# Patient Record
Sex: Female | Born: 1970 | Race: Black or African American | Hispanic: No | Marital: Single | Smoking: Never smoker
Health system: Southern US, Community
[De-identification: ages and names within clinical notes are randomized; demographics above are authoritative.]

## PROBLEM LIST (undated history)

## (undated) DIAGNOSIS — I509 Heart failure, unspecified: Secondary | ICD-10-CM

## (undated) DIAGNOSIS — I428 Other cardiomyopathies: Secondary | ICD-10-CM

## (undated) DIAGNOSIS — I10 Essential (primary) hypertension: Secondary | ICD-10-CM

## (undated) DIAGNOSIS — J45909 Unspecified asthma, uncomplicated: Secondary | ICD-10-CM

## (undated) DIAGNOSIS — K219 Gastro-esophageal reflux disease without esophagitis: Secondary | ICD-10-CM

## (undated) HISTORY — DX: Heart failure, unspecified: I50.9

## (undated) HISTORY — DX: Other cardiomyopathies: I42.8

## (undated) HISTORY — DX: Essential (primary) hypertension: I10

---

## 2001-06-25 ENCOUNTER — Emergency Department (HOSPITAL_COMMUNITY): Admission: EM | Admit: 2001-06-25 | Discharge: 2001-06-25 | Payer: Self-pay | Admitting: Emergency Medicine

## 2001-06-25 ENCOUNTER — Encounter: Payer: Self-pay | Admitting: Emergency Medicine

## 2001-06-29 ENCOUNTER — Emergency Department (HOSPITAL_COMMUNITY): Admission: EM | Admit: 2001-06-29 | Discharge: 2001-06-29 | Payer: Self-pay | Admitting: Emergency Medicine

## 2001-06-29 ENCOUNTER — Encounter: Payer: Self-pay | Admitting: Emergency Medicine

## 2002-08-16 ENCOUNTER — Emergency Department (HOSPITAL_COMMUNITY): Admission: EM | Admit: 2002-08-16 | Discharge: 2002-08-16 | Payer: Self-pay | Admitting: *Deleted

## 2009-04-16 ENCOUNTER — Other Ambulatory Visit: Admission: RE | Admit: 2009-04-16 | Discharge: 2009-04-16 | Payer: Self-pay | Admitting: Unknown Physician Specialty

## 2009-12-29 ENCOUNTER — Emergency Department (HOSPITAL_COMMUNITY): Admission: EM | Admit: 2009-12-29 | Discharge: 2009-12-29 | Payer: Self-pay | Admitting: Emergency Medicine

## 2010-04-08 ENCOUNTER — Inpatient Hospital Stay (HOSPITAL_COMMUNITY): Admission: RE | Admit: 2010-04-08 | Payer: Self-pay | Source: Ambulatory Visit

## 2010-04-08 ENCOUNTER — Other Ambulatory Visit (HOSPITAL_COMMUNITY)
Admission: RE | Admit: 2010-04-08 | Discharge: 2010-04-08 | Disposition: A | Discharge status: Home / Self Care | Payer: Self-pay | Source: Ambulatory Visit | Attending: Unknown Physician Specialty | Admitting: Unknown Physician Specialty

## 2010-04-08 ENCOUNTER — Other Ambulatory Visit: Payer: Self-pay | Admitting: Nurse Practitioner

## 2010-04-08 DIAGNOSIS — N879 Dysplasia of cervix uteri, unspecified: Secondary | ICD-10-CM | POA: Insufficient documentation

## 2011-09-10 ENCOUNTER — Other Ambulatory Visit (HOSPITAL_COMMUNITY): Payer: Self-pay | Admitting: Nurse Practitioner

## 2011-09-10 DIAGNOSIS — Z139 Encounter for screening, unspecified: Secondary | ICD-10-CM

## 2011-09-15 ENCOUNTER — Ambulatory Visit (HOSPITAL_COMMUNITY): Payer: Self-pay

## 2011-10-05 ENCOUNTER — Ambulatory Visit (HOSPITAL_COMMUNITY): Payer: Self-pay

## 2012-10-03 ENCOUNTER — Ambulatory Visit (HOSPITAL_COMMUNITY): Payer: Self-pay

## 2012-10-13 ENCOUNTER — Ambulatory Visit (HOSPITAL_COMMUNITY): Payer: Self-pay

## 2014-07-16 ENCOUNTER — Emergency Department (HOSPITAL_COMMUNITY): Payer: 59

## 2014-07-16 ENCOUNTER — Encounter (HOSPITAL_COMMUNITY): Payer: Self-pay | Admitting: Emergency Medicine

## 2014-07-16 ENCOUNTER — Emergency Department (HOSPITAL_COMMUNITY)
Admission: EM | Admit: 2014-07-16 | Discharge: 2014-07-16 | Disposition: A | Discharge status: Home / Self Care | Payer: 59 | Attending: Emergency Medicine | Admitting: Emergency Medicine

## 2014-07-16 DIAGNOSIS — R1013 Epigastric pain: Secondary | ICD-10-CM | POA: Insufficient documentation

## 2014-07-16 DIAGNOSIS — R0602 Shortness of breath: Secondary | ICD-10-CM | POA: Diagnosis not present

## 2014-07-16 DIAGNOSIS — R1011 Right upper quadrant pain: Secondary | ICD-10-CM | POA: Diagnosis not present

## 2014-07-16 DIAGNOSIS — R109 Unspecified abdominal pain: Secondary | ICD-10-CM | POA: Diagnosis present

## 2014-07-16 DIAGNOSIS — R112 Nausea with vomiting, unspecified: Secondary | ICD-10-CM | POA: Diagnosis not present

## 2014-07-16 DIAGNOSIS — R05 Cough: Secondary | ICD-10-CM | POA: Insufficient documentation

## 2014-07-16 LAB — CBC WITH DIFFERENTIAL/PLATELET
BASOS PCT: 1 % (ref 0–1)
Basophils Absolute: 0.1 10*3/uL (ref 0.0–0.1)
EOS ABS: 0.1 10*3/uL (ref 0.0–0.7)
Eosinophils Relative: 2 % (ref 0–5)
HEMATOCRIT: 38.6 % (ref 36.0–46.0)
Hemoglobin: 12.3 g/dL (ref 12.0–15.0)
LYMPHS PCT: 29 % (ref 12–46)
Lymphs Abs: 2.1 10*3/uL (ref 0.7–4.0)
MCH: 26.6 pg (ref 26.0–34.0)
MCHC: 31.9 g/dL (ref 30.0–36.0)
MCV: 83.5 fL (ref 78.0–100.0)
MONO ABS: 0.5 10*3/uL (ref 0.1–1.0)
Monocytes Relative: 7 % (ref 3–12)
NEUTROS ABS: 4.4 10*3/uL (ref 1.7–7.7)
NEUTROS PCT: 61 % (ref 43–77)
Platelets: 254 10*3/uL (ref 150–400)
RBC: 4.62 MIL/uL (ref 3.87–5.11)
RDW: 13.7 % (ref 11.5–15.5)
WBC: 7.2 10*3/uL (ref 4.0–10.5)

## 2014-07-16 LAB — URINALYSIS, ROUTINE W REFLEX MICROSCOPIC
Bilirubin Urine: NEGATIVE
Glucose, UA: NEGATIVE mg/dL
Ketones, ur: NEGATIVE mg/dL
Leukocytes, UA: NEGATIVE
NITRITE: NEGATIVE
Protein, ur: 30 mg/dL — AB
Specific Gravity, Urine: 1.025 (ref 1.005–1.030)
UROBILINOGEN UA: 0.2 mg/dL (ref 0.0–1.0)
pH: 5.5 (ref 5.0–8.0)

## 2014-07-16 LAB — COMPREHENSIVE METABOLIC PANEL
ALBUMIN: 3.2 g/dL — AB (ref 3.5–5.0)
ALT: 47 U/L (ref 14–54)
AST: 27 U/L (ref 15–41)
Alkaline Phosphatase: 54 U/L (ref 38–126)
Anion gap: 7 (ref 5–15)
BUN: 16 mg/dL (ref 6–20)
CO2: 21 mmol/L — AB (ref 22–32)
Calcium: 8.1 mg/dL — ABNORMAL LOW (ref 8.9–10.3)
Chloride: 111 mmol/L (ref 101–111)
Creatinine, Ser: 1.24 mg/dL — ABNORMAL HIGH (ref 0.44–1.00)
GFR calc Af Amer: 59 mL/min — ABNORMAL LOW (ref >60)
GFR calc non Af Amer: 51 mL/min — ABNORMAL LOW (ref >60)
GLUCOSE: 114 mg/dL — AB (ref 65–99)
POTASSIUM: 3.8 mmol/L (ref 3.5–5.1)
Sodium: 139 mmol/L (ref 135–145)
TOTAL PROTEIN: 6 g/dL — AB (ref 6.5–8.1)
Total Bilirubin: 0.4 mg/dL (ref 0.3–1.2)

## 2014-07-16 LAB — TROPONIN I: Troponin I: <0.03 ng/mL (ref <0.031)

## 2014-07-16 LAB — URINE MICROSCOPIC-ADD ON

## 2014-07-16 LAB — LIPASE, BLOOD: Lipase: 21 U/L — ABNORMAL LOW (ref 22–51)

## 2014-07-16 MED ORDER — SODIUM CHLORIDE 0.9 % IV BOLUS (SEPSIS)
1000.0000 mL | Freq: Once | INTRAVENOUS | Status: AC
  Administered 2014-07-16: 1000 mL via INTRAVENOUS

## 2014-07-16 MED ORDER — SUCRALFATE 1 GM/10ML PO SUSP: 1.0000 g | Freq: Three times a day (TID) | ORAL | Status: DC

## 2014-07-16 MED ORDER — FAMOTIDINE IN NACL 20-0.9 MG/50ML-% IV SOLN
20.0000 mg | Freq: Once | INTRAVENOUS | Status: AC
  Administered 2014-07-16: 20 mg via INTRAVENOUS
  Filled 2014-07-16: qty 50

## 2014-07-16 MED ORDER — ONDANSETRON HCL 4 MG/2ML IJ SOLN
4.0000 mg | Freq: Once | INTRAMUSCULAR | Status: AC
  Administered 2014-07-16: 4 mg via INTRAVENOUS
  Filled 2014-07-16: qty 2

## 2014-07-16 MED ORDER — RANITIDINE HCL 150 MG PO TABS: 150.0000 mg | ORAL_TABLET | Freq: Two times a day (BID) | ORAL | Status: DC

## 2014-07-16 MED ORDER — TRAMADOL HCL 50 MG PO TABS: 50.0000 mg | ORAL_TABLET | Freq: Four times a day (QID) | ORAL | Status: DC | PRN

## 2014-07-16 NOTE — ED Notes (Signed)
Patient complaining of epigastric pain x 4 days. States "it feels like my food is coming back up and it feels like I can't hardly breathe when I lay down."

## 2014-07-16 NOTE — ED Notes (Signed)
,  Patient with no complaints at this time. Respirations even and unlabored. Skin warm/dry. Discharge instructions reviewed with patient at this time. Patient given opportunity to voice concerns/ask questions. Patient discharged at this time and left Emergency Department with steady gait.

## 2014-07-16 NOTE — Discharge Instructions (Signed)

## 2014-07-16 NOTE — ED Provider Notes (Signed)
CSN: [id number]     Arrival date & time 07/16/14  1510 History   First MD Initiated Contact with Patient 07/16/14 1527     Chief Complaint  Patient presents with  . Abdominal Pain     (Consider location/radiation/quality/duration/timing/severity/associated sxs/prior Treatment) HPI Comments: Patient presents to the ER for evaluation of upper abdominal pain. Symptoms began 4 days ago. She reports that symptoms have been present continuously since they started, but she does report eating and lying down flat makes the symptoms worse. She reports that she has noticed shortness of breath, especially when she lies down flat on her back. She also has had nausea and vomiting, in the last 24 hours, everything she eats comes back up. She has not had any urinary symptoms. She denies fever, diarrhea, constipation. She has recently had cough and congestion.  Patient is a 44 y.o. female presenting with abdominal pain.  Abdominal Pain Associated symptoms: cough, nausea, shortness of breath and vomiting     History reviewed. No pertinent past medical history. History reviewed. No pertinent past surgical history. History reviewed. No pertinent family history. History  Substance Use Topics  . Smoking status: Never Smoker   . Smokeless tobacco: Not on file  . Alcohol Use: No   OB History    No data available     Review of Systems  Respiratory: Positive for cough and shortness of breath.   Gastrointestinal: Positive for nausea, vomiting and abdominal pain.  All other systems reviewed and are negative.     Allergies  Review of patient's allergies indicates no known allergies.  Home Medications   Prior to Admission medications   Not on File   BP 171/126 mmHg  Pulse 117  Temp(Src) 98 F (36.7 C) (Oral)  Resp 18  Ht  (1.676 m)  Wt 169 lb (76.658 kg)  BMI 27.29 kg/m2  SpO2 100%  LMP 07/13/2014 Physical Exam  Constitutional: She is oriented to person, place, and time. She appears  well-developed and well-nourished. No distress.  HENT:  Head: Normocephalic and atraumatic.  Right Ear: Hearing normal.  Left Ear: Hearing normal.  Nose: Nose normal.  Mouth/Throat: Oropharynx is clear and moist and mucous membranes are normal.  Eyes: Conjunctivae and EOM are normal. Pupils are equal, round, and reactive to light.  Neck: Normal range of motion. Neck supple.  Cardiovascular: Regular rhythm, S1 normal and S2 normal.  Exam reveals no gallop and no friction rub.   No murmur heard. Pulmonary/Chest: Effort normal and breath sounds normal. No respiratory distress. She exhibits no tenderness.  Abdominal: Soft. Normal appearance and bowel sounds are normal. There is no hepatosplenomegaly. There is tenderness in the epigastric area. There is no rebound, no guarding, no tenderness at McBurney's point and negative Murphy's sign. No hernia.  Musculoskeletal: Normal range of motion.  Neurological: She is alert and oriented to person, place, and time. She has normal strength. No cranial nerve deficit or sensory deficit. Coordination normal. GCS eye subscore is 4. GCS verbal subscore is 5. GCS motor subscore is 6.  Skin: Skin is warm, dry and intact. No rash noted. No cyanosis.  Psychiatric: She has a normal mood and affect. Her speech is normal and behavior is normal. Thought content normal.  Nursing note and vitals reviewed.   ED Course  Procedures (including critical care time) Labs Review Labs Reviewed  COMPREHENSIVE METABOLIC PANEL - Abnormal; Notable for the following:    CO2 21 (*)    Glucose, Bld 114 (*)  Creatinine, Ser 1.24 (*)    Calcium 8.1 (*)    Total Protein 6.0 (*)    Albumin 3.2 (*)    GFR calc non Af Amer 51 (*)    GFR calc Af Amer 59 (*)    All other components within normal limits  LIPASE, BLOOD - Abnormal; Notable for the following:    Lipase 21 (*)    All other components within normal limits  CBC WITH DIFFERENTIAL/PLATELET  TROPONIN I  URINALYSIS,  ROUTINE W REFLEX MICROSCOPIC    Imaging Review Dg Chest 2 View  07/16/2014   CLINICAL DATA:  Chest pain and difficulty breathing  EXAM: CHEST  2 VIEW  COMPARISON:  None.  FINDINGS: Cardiac shadow is enlarged. The lungs are clear bilaterally. No bony abnormality is noted.  IMPRESSION: Cardiomegaly without acute pulmonary abnormality.   Electronically Signed   By: Alcide Clever M.D.   On: 07/16/2014 16:28   US Abdomen Limited Ruq  07/16/2014   CLINICAL DATA:  Right upper quadrant pain  EXAM: US ABDOMEN LIMITED - RIGHT UPPER QUADRANT  COMPARISON:  None.  FINDINGS: Gallbladder:  Well distended with diffuse gallbladder wall thickening and gallbladder wall edema. No calculi are seen.  Common bile duct:  Diameter: 2.6 mm.  Liver:  Within normal limits.  IMPRESSION: Diffusely thickened gallbladder wall without evidence of cholelithiasis. These changes would be consistent with acalculous cholecystitis. Correlation with the physical exam is recommended.   Electronically Signed   By: Alcide Clever M.D.   On: 07/16/2014 16:41     EKG Interpretation   Date/Time:  Monday Jul 16 2014 15:19:35 EDT Ventricular Rate:  116 PR Interval:  146 QRS Duration: 74 QT Interval:  312 QTC Calculation: 433 R Axis:   92 Text Interpretation:  Sinus tachycardia Rightward axis Cannot rule out  Anterior infarct , age undetermined T wave abnormality, consider inferior  ischemia Abnormal ECG No previous tracing Confirmed by POLLINA  MD,  CHRISTOPHER [phone]) on 07/16/2014 3:38:01 PM      MDM   Final diagnoses:  RUQ pain  Shortness of breath    Patient presents to the ER for evaluation of abdominal pain. Patient has been experiencing epigastric pain for 4 days. Symptoms worsened when she lies down or when she eats. She reports that she is feeling short of breath with this. Her lungs are clear on examination. Chest x-ray did not show any acute abnormality. No concern for congestive heart failure. Blood work was entirely  normal. This included troponin. EKG was unremarkable other than mild tachycardia, felt to be secondary to nausea, vomiting, dehydration and abdominal pain. LFTs are normal. Lipase is normal. Ultrasound of right upper quadrant reveals gallbladder wall thickening, but there are no stones noted. She does not have tenderness over the right upper quadrant specifically, but more diffuse tenderness. Maximal tenderness is in the epigastric region. She will be treated with antacids and analgesia, referral to GI. I suspect that her symptoms are more related to reflux then acalculous cholecystitis. After GI workup, can be referred to surgery if necessary.    Gilda Crease, MD 07/16/14 [phone]

## 2014-08-03 ENCOUNTER — Encounter (HOSPITAL_COMMUNITY): Payer: Self-pay | Admitting: *Deleted

## 2014-08-03 ENCOUNTER — Emergency Department (HOSPITAL_COMMUNITY): Payer: 59

## 2014-08-03 ENCOUNTER — Emergency Department (HOSPITAL_COMMUNITY)
Admission: EM | Admit: 2014-08-03 | Discharge: 2014-08-03 | Disposition: A | Discharge status: Home / Self Care | Payer: 59 | Source: Home / Self Care | Attending: Emergency Medicine | Admitting: Emergency Medicine

## 2014-08-03 DIAGNOSIS — R0602 Shortness of breath: Secondary | ICD-10-CM | POA: Diagnosis not present

## 2014-08-03 DIAGNOSIS — Z79899 Other long term (current) drug therapy: Secondary | ICD-10-CM | POA: Insufficient documentation

## 2014-08-03 DIAGNOSIS — I420 Dilated cardiomyopathy: Secondary | ICD-10-CM | POA: Diagnosis present

## 2014-08-03 DIAGNOSIS — I313 Pericardial effusion (noninflammatory): Secondary | ICD-10-CM | POA: Diagnosis present

## 2014-08-03 DIAGNOSIS — R109 Unspecified abdominal pain: Secondary | ICD-10-CM | POA: Insufficient documentation

## 2014-08-03 DIAGNOSIS — I5021 Acute systolic (congestive) heart failure: Principal | ICD-10-CM | POA: Diagnosis present

## 2014-08-03 DIAGNOSIS — R609 Edema, unspecified: Secondary | ICD-10-CM

## 2014-08-03 DIAGNOSIS — E876 Hypokalemia: Secondary | ICD-10-CM | POA: Diagnosis present

## 2014-08-03 DIAGNOSIS — R110 Nausea: Secondary | ICD-10-CM

## 2014-08-03 DIAGNOSIS — R600 Localized edema: Secondary | ICD-10-CM | POA: Insufficient documentation

## 2014-08-03 DIAGNOSIS — I2510 Atherosclerotic heart disease of native coronary artery without angina pectoris: Secondary | ICD-10-CM | POA: Diagnosis present

## 2014-08-03 DIAGNOSIS — I10 Essential (primary) hypertension: Secondary | ICD-10-CM | POA: Diagnosis present

## 2014-08-03 DIAGNOSIS — M7989 Other specified soft tissue disorders: Secondary | ICD-10-CM

## 2014-08-03 DIAGNOSIS — T502X5A Adverse effect of carbonic-anhydrase inhibitors, benzothiadiazides and other diuretics, initial encounter: Secondary | ICD-10-CM | POA: Diagnosis present

## 2014-08-03 DIAGNOSIS — Z8249 Family history of ischemic heart disease and other diseases of the circulatory system: Secondary | ICD-10-CM

## 2014-08-03 DIAGNOSIS — J45909 Unspecified asthma, uncomplicated: Secondary | ICD-10-CM | POA: Diagnosis present

## 2014-08-03 LAB — CBC WITH DIFFERENTIAL/PLATELET
Basophils Absolute: 0.1 10*3/uL (ref 0.0–0.1)
Basophils Relative: 1 % (ref 0–1)
Eosinophils Absolute: 0.3 10*3/uL (ref 0.0–0.7)
Eosinophils Relative: 4 % (ref 0–5)
HEMATOCRIT: 40.7 % (ref 36.0–46.0)
Hemoglobin: 13 g/dL (ref 12.0–15.0)
Lymphocytes Relative: 33 % (ref 12–46)
Lymphs Abs: 2.4 10*3/uL (ref 0.7–4.0)
MCH: 26.2 pg (ref 26.0–34.0)
MCHC: 31.9 g/dL (ref 30.0–36.0)
MCV: 81.9 fL (ref 78.0–100.0)
MONO ABS: 0.6 10*3/uL (ref 0.1–1.0)
Monocytes Relative: 8 % (ref 3–12)
NEUTROS ABS: 3.8 10*3/uL (ref 1.7–7.7)
NEUTROS PCT: 54 % (ref 43–77)
Platelets: 227 10*3/uL (ref 150–400)
RBC: 4.97 MIL/uL (ref 3.87–5.11)
RDW: 13.8 % (ref 11.5–15.5)
WBC: 7.1 10*3/uL (ref 4.0–10.5)

## 2014-08-03 LAB — COMPREHENSIVE METABOLIC PANEL
ALK PHOS: 55 U/L (ref 38–126)
ALT: 37 U/L (ref 14–54)
AST: 27 U/L (ref 15–41)
Albumin: 3.2 g/dL — ABNORMAL LOW (ref 3.5–5.0)
Anion gap: 9 (ref 5–15)
BILIRUBIN TOTAL: 0.9 mg/dL (ref 0.3–1.2)
BUN: 17 mg/dL (ref 6–20)
CO2: 21 mmol/L — ABNORMAL LOW (ref 22–32)
Calcium: 8.4 mg/dL — ABNORMAL LOW (ref 8.9–10.3)
Chloride: 109 mmol/L (ref 101–111)
Creatinine, Ser: 1.09 mg/dL — ABNORMAL HIGH (ref 0.44–1.00)
GFR calc non Af Amer: 60 mL/min — ABNORMAL LOW (ref >60)
GLUCOSE: 82 mg/dL (ref 65–99)
POTASSIUM: 3.9 mmol/L (ref 3.5–5.1)
Sodium: 139 mmol/L (ref 135–145)
Total Protein: 6.1 g/dL — ABNORMAL LOW (ref 6.5–8.1)

## 2014-08-03 LAB — LIPASE, BLOOD: LIPASE: 16 U/L — AB (ref 22–51)

## 2014-08-03 MED ORDER — SODIUM CHLORIDE 0.9 % IV SOLN: INTRAVENOUS | Status: DC

## 2014-08-03 MED ORDER — IOHEXOL 300 MG/ML  SOLN
100.0000 mL | Freq: Once | INTRAMUSCULAR | Status: AC | PRN
  Administered 2014-08-03: 100 mL via INTRAVENOUS

## 2014-08-03 MED ORDER — FENTANYL CITRATE (PF) 100 MCG/2ML IJ SOLN
50.0000 ug | Freq: Once | INTRAMUSCULAR | Status: AC
  Administered 2014-08-03: 50 ug via INTRAVENOUS
  Filled 2014-08-03: qty 2

## 2014-08-03 MED ORDER — ONDANSETRON HCL 4 MG/2ML IJ SOLN
4.0000 mg | Freq: Once | INTRAMUSCULAR | Status: AC
  Administered 2014-08-03: 4 mg via INTRAVENOUS
  Filled 2014-08-03: qty 2

## 2014-08-03 MED ORDER — HYDROCODONE-ACETAMINOPHEN 5-325 MG PO TABS: 1.0000 | ORAL_TABLET | Freq: Four times a day (QID) | ORAL | Status: DC | PRN

## 2014-08-03 MED ORDER — IOHEXOL 300 MG/ML  SOLN
25.0000 mL | Freq: Once | INTRAMUSCULAR | Status: AC | PRN
  Administered 2014-08-03: 25 mL via ORAL

## 2014-08-03 MED ORDER — SODIUM CHLORIDE 0.9 % IV BOLUS (SEPSIS)
1000.0000 mL | Freq: Once | INTRAVENOUS | Status: AC
  Administered 2014-08-03: 1000 mL via INTRAVENOUS

## 2014-08-03 NOTE — ED Provider Notes (Signed)
CSN: [id number]     Arrival date & time 08/03/14  1502 History   First MD Initiated Contact with Patient 08/03/14 1900     Chief Complaint  Patient presents with  . Leg Swelling     (Consider location/radiation/quality/duration/timing/severity/associated sxs/prior Treatment) The history is provided by the patient.   44 year old female with complaint of bilateral leg swelling. Both legs are swollen equally. The symptoms are getting worse over the past 3 days. Patient also with persistent abdominal pain predominantly in the epigastric area. Patient was seen May 23 for this with negative labs and negative ultrasound of the abdomen. No evidence of any gallstones. Patient states the symptoms have persisted. Belly pain is 6 out of 10. There is no pain in the legs. No shortness of breath no chest pain. Patient is followed by the health department.  History reviewed. No pertinent past medical history. History reviewed. No pertinent past surgical history. No family history on file. History  Substance Use Topics  . Smoking status: Never Smoker   . Smokeless tobacco: Not on file  . Alcohol Use: No   OB History    No data available     Review of Systems  Constitutional: Negative for fever.  HENT: Negative for congestion.   Eyes: Negative for redness.  Respiratory: Negative for shortness of breath.   Cardiovascular: Negative for chest pain.  Gastrointestinal: Positive for nausea and abdominal pain. Negative for vomiting and diarrhea.  Genitourinary: Negative for dysuria.  Musculoskeletal: Negative for back pain.  Neurological: Negative for headaches.  Hematological: Does not bruise/bleed easily.      Allergies  Tramadol  Home Medications   Prior to Admission medications   Medication Sig Start Date End Date Taking? Authorizing Provider  ranitidine (ZANTAC) 150 MG tablet Take 1 tablet (150 mg total) by mouth 2 (two) times daily. 07/16/14  Yes Gilda Crease, MD  sucralfate  (CARAFATE) 1 GM/10ML suspension Take 10 mLs (1 g total) by mouth 4 (four) times daily -  with meals and at bedtime. 07/16/14  Yes Gilda Crease, MD   BP 143/108 mmHg  Pulse 92  Temp(Src) 98.7 F (37.1 C) (Oral)  Resp 18  Ht 5' (1.524 m)  Wt 165 lb (74.844 kg)  BMI 32.22 kg/m2  SpO2 100%  LMP 07/13/2014 Physical Exam  Constitutional: She is oriented to person, place, and time. She appears well-developed and well-nourished. No distress.  HENT:  Head: Normocephalic and atraumatic.  Mouth/Throat: Oropharynx is clear and moist.  Eyes: Conjunctivae and EOM are normal. Pupils are equal, round, and reactive to light.  Neck: Normal range of motion. Neck supple.  Cardiovascular: Normal rate, regular rhythm and normal heart sounds.   No murmur heard. Pulmonary/Chest: Effort normal and breath sounds normal.  Abdominal: Soft. Bowel sounds are normal. There is no tenderness.  Musculoskeletal: She exhibits edema. She exhibits no tenderness.  Bilateral leg swelling with edema. Equal bilaterally.  Neurological: She is alert and oriented to person, place, and time. No cranial nerve deficit. Coordination normal.  Skin: Skin is warm. No rash noted.  Nursing note and vitals reviewed.   ED Course  Procedures (including critical care time) Labs Review Labs Reviewed  COMPREHENSIVE METABOLIC PANEL - Abnormal; Notable for the following:    CO2 21 (*)    Creatinine, Ser 1.09 (*)    Calcium 8.4 (*)    Total Protein 6.1 (*)    Albumin 3.2 (*)    GFR calc non Af Amer 60 (*)  All other components within normal limits  LIPASE, BLOOD - Abnormal; Notable for the following:    Lipase 16 (*)    All other components within normal limits  CBC WITH DIFFERENTIAL/PLATELET   Results for orders placed or performed during the hospital encounter of 08/03/14  Comprehensive metabolic panel  Result Value Ref Range   Sodium 139 135 - 145 mmol/L   Potassium 3.9 3.5 - 5.1 mmol/L   Chloride 109 101 - 111  mmol/L   CO2 21 (L) 22 - 32 mmol/L   Glucose, Bld 82 65 - 99 mg/dL   BUN 17 6 - 20 mg/dL   Creatinine, Ser 6.04 (H) 0.44 - 1.00 mg/dL   Calcium 8.4 (L) 8.9 - 10.3 mg/dL   Total Protein 6.1 (L) 6.5 - 8.1 g/dL   Albumin 3.2 (L) 3.5 - 5.0 g/dL   AST 27 15 - 41 U/L   ALT 37 14 - 54 U/L   Alkaline Phosphatase 55 38 - 126 U/L   Total Bilirubin 0.9 0.3 - 1.2 mg/dL   GFR calc non Af Amer 60 (L) >60 mL/min   GFR calc Af Amer >60 >60 mL/min   Anion gap 9 5 - 15  Lipase, blood  Result Value Ref Range   Lipase 16 (L) 22 - 51 U/L  CBC with Differential/Platelet  Result Value Ref Range   WBC 7.1 4.0 - 10.5 K/uL   RBC 4.97 3.87 - 5.11 MIL/uL   Hemoglobin 13.0 12.0 - 15.0 g/dL   HCT 54.0 98.1 - 19.1 %   MCV 81.9 78.0 - 100.0 fL   MCH 26.2 26.0 - 34.0 pg   MCHC 31.9 30.0 - 36.0 g/dL   RDW 47.8 29.5 - 62.1 %   Platelets 227 150 - 400 K/uL   Neutrophils Relative % 54 43 - 77 %   Neutro Abs 3.8 1.7 - 7.7 K/uL   Lymphocytes Relative 33 12 - 46 %   Lymphs Abs 2.4 0.7 - 4.0 K/uL   Monocytes Relative 8 3 - 12 %   Monocytes Absolute 0.6 0.1 - 1.0 K/uL   Eosinophils Relative 4 0 - 5 %   Eosinophils Absolute 0.3 0.0 - 0.7 K/uL   Basophils Relative 1 0 - 1 %   Basophils Absolute 0.1 0.0 - 0.1 K/uL     Imaging Review No results found.   EKG Interpretation None      MDM   Final diagnoses:  Abdominal pain  Leg swelling   Patient here with complaint of bilateral leg swelling. Also for abdominal pain. Patient was seen for the abdominal pain on May 23 head ultrasound done showed no gallstones. Labs at that time including liver function tests without significant abnormalities.  Repeat the labs here today no leukocytosis nothing consistent with pancreatitis. Liver function tests without any abnormalities. CT abdomen and pelvis is pending.  Regarding the leg swelling no evidence of any of fluid on the lungs on the 23rd no evidence of any renal function and amount is. Chest x-ray here today is  pending. Patient nontoxic no acute distress. If studies are negative patient will be discharged to follow-up with health Department and consideration for hida scan.     Vanetta Mulders, MD 08/03/14 2152

## 2014-08-03 NOTE — ED Notes (Signed)
Swelling of lower extremities and abdomen onset 3 days ago, denies pain

## 2014-08-03 NOTE — Discharge Instructions (Signed)
CT scan is showing evidence of some fluid throughout her soft tissues. This could be related to something blocking the return of the fluid could be related to some congestive heart failure typically since the heart is enlarged. Would definitely follow-up with the health department. Would definitely follow-up with cardiology appointment information and where to call provided. Otherwise no other significant problems inside the abdomen particularly no evidence of any gallstones.  Take the hydrocodone as needed for discomfort.

## 2014-08-04 ENCOUNTER — Encounter (HOSPITAL_COMMUNITY): Payer: Self-pay | Admitting: Emergency Medicine

## 2014-08-04 ENCOUNTER — Inpatient Hospital Stay (HOSPITAL_COMMUNITY)
Admission: EM | Admit: 2014-08-04 | Discharge: 2014-08-07 | DRG: 287 | Disposition: A | Discharge status: Home / Self Care | Payer: 59 | Attending: Internal Medicine | Admitting: Internal Medicine

## 2014-08-04 ENCOUNTER — Emergency Department (HOSPITAL_COMMUNITY): Payer: 59

## 2014-08-04 DIAGNOSIS — R0602 Shortness of breath: Secondary | ICD-10-CM | POA: Diagnosis present

## 2014-08-04 DIAGNOSIS — I313 Pericardial effusion (noninflammatory): Secondary | ICD-10-CM | POA: Diagnosis present

## 2014-08-04 DIAGNOSIS — Z79899 Other long term (current) drug therapy: Secondary | ICD-10-CM | POA: Diagnosis not present

## 2014-08-04 DIAGNOSIS — I10 Essential (primary) hypertension: Secondary | ICD-10-CM | POA: Diagnosis present

## 2014-08-04 DIAGNOSIS — Z8249 Family history of ischemic heart disease and other diseases of the circulatory system: Secondary | ICD-10-CM | POA: Diagnosis not present

## 2014-08-04 DIAGNOSIS — I509 Heart failure, unspecified: Secondary | ICD-10-CM

## 2014-08-04 DIAGNOSIS — I420 Dilated cardiomyopathy: Secondary | ICD-10-CM | POA: Diagnosis present

## 2014-08-04 DIAGNOSIS — T502X5A Adverse effect of carbonic-anhydrase inhibitors, benzothiadiazides and other diuretics, initial encounter: Secondary | ICD-10-CM | POA: Diagnosis present

## 2014-08-04 DIAGNOSIS — J45909 Unspecified asthma, uncomplicated: Secondary | ICD-10-CM | POA: Diagnosis not present

## 2014-08-04 DIAGNOSIS — E876 Hypokalemia: Secondary | ICD-10-CM | POA: Diagnosis present

## 2014-08-04 DIAGNOSIS — R110 Nausea: Secondary | ICD-10-CM | POA: Diagnosis present

## 2014-08-04 DIAGNOSIS — I5021 Acute systolic (congestive) heart failure: Secondary | ICD-10-CM | POA: Diagnosis present

## 2014-08-04 DIAGNOSIS — I428 Other cardiomyopathies: Secondary | ICD-10-CM | POA: Insufficient documentation

## 2014-08-04 DIAGNOSIS — I2510 Atherosclerotic heart disease of native coronary artery without angina pectoris: Secondary | ICD-10-CM | POA: Diagnosis present

## 2014-08-04 DIAGNOSIS — R109 Unspecified abdominal pain: Secondary | ICD-10-CM | POA: Diagnosis present

## 2014-08-04 DIAGNOSIS — I5041 Acute combined systolic (congestive) and diastolic (congestive) heart failure: Secondary | ICD-10-CM | POA: Diagnosis not present

## 2014-08-04 DIAGNOSIS — I25110 Atherosclerotic heart disease of native coronary artery with unstable angina pectoris: Secondary | ICD-10-CM | POA: Diagnosis not present

## 2014-08-04 HISTORY — DX: Unspecified asthma, uncomplicated: J45.909

## 2014-08-04 LAB — D-DIMER, QUANTITATIVE (NOT AT ARMC): D DIMER QUANT: 3.5 ug{FEU}/mL — AB (ref 0.00–0.48)

## 2014-08-04 LAB — I-STAT TROPONIN, ED: TROPONIN I, POC: 0.01 ng/mL (ref 0.00–0.08)

## 2014-08-04 LAB — I-STAT CHEM 8, ED
BUN: 17 mg/dL (ref 6–20)
CHLORIDE: 108 mmol/L (ref 101–111)
CREATININE: 1 mg/dL (ref 0.44–1.00)
Calcium, Ion: 1.19 mmol/L (ref 1.12–1.23)
GLUCOSE: 96 mg/dL (ref 65–99)
HEMATOCRIT: 44 % (ref 36.0–46.0)
Hemoglobin: 15 g/dL (ref 12.0–15.0)
Potassium: 4.1 mmol/L (ref 3.5–5.1)
Sodium: 141 mmol/L (ref 135–145)
TCO2: 19 mmol/L (ref 0–100)

## 2014-08-04 LAB — BRAIN NATRIURETIC PEPTIDE: B NATRIURETIC PEPTIDE 5: 982.2 pg/mL — AB (ref 0.0–100.0)

## 2014-08-04 MED ORDER — SODIUM CHLORIDE 0.9 % IJ SOLN
3.0000 mL | Freq: Two times a day (BID) | INTRAMUSCULAR | Status: DC
  Administered 2014-08-05 – 2014-08-06 (×5): 3 mL via INTRAVENOUS

## 2014-08-04 MED ORDER — ONDANSETRON HCL 4 MG/2ML IJ SOLN
4.0000 mg | Freq: Once | INTRAMUSCULAR | Status: AC
  Administered 2014-08-04: 4 mg via INTRAVENOUS
  Filled 2014-08-04: qty 2

## 2014-08-04 MED ORDER — ACETAMINOPHEN 325 MG PO TABS: 650.0000 mg | ORAL_TABLET | ORAL | Status: DC | PRN

## 2014-08-04 MED ORDER — FUROSEMIDE 10 MG/ML IJ SOLN
40.0000 mg | Freq: Every day | INTRAMUSCULAR | Status: DC
  Administered 2014-08-05 – 2014-08-06 (×2): 40 mg via INTRAVENOUS
  Filled 2014-08-04 (×2): qty 4

## 2014-08-04 MED ORDER — ONDANSETRON HCL 4 MG/2ML IJ SOLN
4.0000 mg | Freq: Four times a day (QID) | INTRAMUSCULAR | Status: DC | PRN
  Administered 2014-08-06: 4 mg via INTRAVENOUS
  Filled 2014-08-04: qty 2

## 2014-08-04 MED ORDER — FUROSEMIDE 20 MG PO TABS
40.0000 mg | ORAL_TABLET | Freq: Once | ORAL | Status: AC
  Administered 2014-08-04: 40 mg via ORAL
  Filled 2014-08-04: qty 2

## 2014-08-04 MED ORDER — IOHEXOL 350 MG/ML SOLN
100.0000 mL | Freq: Once | INTRAVENOUS | Status: AC | PRN
  Administered 2014-08-04: 100 mL via INTRAVENOUS

## 2014-08-04 MED ORDER — SACUBITRIL-VALSARTAN 24-26 MG PO TABS
1.0000 | ORAL_TABLET | Freq: Two times a day (BID) | ORAL | Status: DC
  Administered 2014-08-05 (×2): 1 via ORAL
  Filled 2014-08-04 (×3): qty 1

## 2014-08-04 MED ORDER — SODIUM CHLORIDE 0.9 % IV SOLN: 250.0000 mL | INTRAVENOUS | Status: DC | PRN

## 2014-08-04 MED ORDER — ASPIRIN EC 81 MG PO TBEC
81.0000 mg | DELAYED_RELEASE_TABLET | Freq: Every day | ORAL | Status: DC
  Administered 2014-08-05 – 2014-08-07 (×2): 81 mg via ORAL
  Filled 2014-08-04 (×3): qty 1

## 2014-08-04 MED ORDER — SODIUM CHLORIDE 0.9 % IV BOLUS (SEPSIS)
1000.0000 mL | Freq: Once | INTRAVENOUS | Status: AC
  Administered 2014-08-04: 1000 mL via INTRAVENOUS

## 2014-08-04 MED ORDER — FUROSEMIDE 10 MG/ML IJ SOLN: 40.0000 mg | Freq: Every day | INTRAMUSCULAR | Status: DC

## 2014-08-04 MED ORDER — ACETAMINOPHEN 500 MG PO TABS
1000.0000 mg | ORAL_TABLET | Freq: Once | ORAL | Status: AC
  Administered 2014-08-04: 1000 mg via ORAL
  Filled 2014-08-04: qty 2

## 2014-08-04 MED ORDER — SODIUM CHLORIDE 0.9 % IJ SOLN: 3.0000 mL | INTRAMUSCULAR | Status: DC | PRN

## 2014-08-04 NOTE — ED Provider Notes (Signed)
CSN: [id number]     Arrival date & time 08/04/14  1431 History   First MD Initiated Contact with Patient 08/04/14 1546     Chief Complaint  Patient presents with  . Shortness of Breath      HPI  Expand All Collapse All   Pt c/o shortness of breath onset Tuesday. Pt seen at [facility] last night for the same. Pt reports she was only given prescription for pain medications.        Past Medical History  Diagnosis Date  . Asthma    Past Surgical History  Procedure Laterality Date  . Cardiac catheterization N/A 08/06/2014    Procedure: Left Heart Cath and Coronary Angiography;  Surgeon: Marykay Lex, MD;  Location: [facility] INVASIVE CV LAB;  Service: Cardiovascular;  Laterality: N/A;   History reviewed. No pertinent family history. History  Substance Use Topics  . Smoking status: Never Smoker   . Smokeless tobacco: Not on file  . Alcohol Use: No   OB History    No data available     Review of Systems  All other systems reviewed and are negative.     Allergies  Tramadol  Home Medications   Prior to Admission medications   Medication Sig Start Date End Date Taking? Authorizing Provider  ranitidine (ZANTAC) 150 MG tablet Take 1 tablet (150 mg total) by mouth 2 (two) times daily. 07/16/14  Yes Gilda Crease, MD  sucralfate (CARAFATE) 1 GM/10ML suspension Take 10 mLs (1 g total) by mouth 4 (four) times daily -  with meals and at bedtime. 07/16/14  Yes Gilda Crease, MD  aspirin EC 81 MG EC tablet Take 1 tablet (81 mg total) by mouth daily. 08/07/14   Clydia Llano, MD  carvedilol (COREG) 6.25 MG tablet Take 1 tablet (6.25 mg total) by mouth 2 (two) times daily with a meal. 08/07/14   Clydia Llano, MD  furosemide (LASIX) 40 MG tablet Take 1 tablet (40 mg total) by mouth daily. 08/07/14   Clydia Llano, MD  HYDROcodone-acetaminophen (NORCO/VICODIN) 5-325 MG per tablet Take 1-2 tablets by mouth every 6 (six) hours as needed. Patient not taking: Reported on 08/04/2014  08/03/14   Vanetta Mulders, MD  losartan (COZAAR) 50 MG tablet Take 1 tablet (50 mg total) by mouth daily. 08/07/14   Clydia Llano, MD   BP 126/98 mmHg  Pulse 100  Temp(Src) 97.2 F (36.2 C) (Oral)  Resp 18  Ht 5\' 1"  (1.549 m)  Wt 174 lb 3.2 oz (79.017 kg)  BMI 32.93 kg/m2  SpO2 100%  LMP 07/13/2014 Physical Exam  Constitutional: She is oriented to person, place, and time. She appears well-developed and well-nourished. No distress.  HENT:  Head: Normocephalic and atraumatic.  Eyes: Pupils are equal, round, and reactive to light.  Neck: Normal range of motion.  Cardiovascular: Normal rate and intact distal pulses.   Pulmonary/Chest: No respiratory distress.  Abdominal: Normal appearance. She exhibits no distension.  Musculoskeletal: Normal range of motion. She exhibits edema.  Neurological: She is alert and oriented to person, place, and time. No cranial nerve deficit.  Skin: Skin is warm and dry. No rash noted.  Psychiatric: She has a normal mood and affect. Her behavior is normal.  Nursing note and vitals reviewed.   ED Course  Procedures (including critical care time) Medications  sodium chloride 0.9 % bolus 1,000 mL (0 mLs Intravenous Stopped 08/04/14 1730)  ondansetron (ZOFRAN) injection 4 mg (4 mg Intravenous Given 08/04/14 1728)  furosemide (LASIX) tablet 40 mg (40 mg Oral Given 08/04/14 1953)  iohexol (OMNIPAQUE) 350 MG/ML injection 100 mL (100 mLs Intravenous Contrast Given 08/04/14 1834)  acetaminophen (TYLENOL) tablet 1,000 mg (1,000 mg Oral Given 08/04/14 1953)  aspirin chewable tablet 81 mg (81 mg Oral Given 08/06/14 0530)  potassium chloride SA (K-DUR,KLOR-CON) CR tablet 40 mEq (40 mEq Oral Given 08/06/14 1542)  0.9 %  sodium chloride infusion (1,000 mLs Intravenous New Bag/Given 08/06/14 1640)  CRITICAL CARE Performed by: Nelva Nay L Total critical care time: 30 min Critical care time was exclusive of separately billable procedures and treating other  patients. Critical care was necessary to treat or prevent imminent or life-threatening deterioration. Critical care was time spent personally by me on the following activities: development of treatment plan with patient and/or surrogate as well as nursing, discussions with consultants, evaluation of patient's response to treatment, examination of patient, obtaining history from patient or surrogate, ordering and performing treatments and interventions, ordering and review of laboratory studies, ordering and review of radiographic studies, pulse oximetry and re-evaluation of patient's condition.   Labs Review Labs Reviewed  BRAIN NATRIURETIC PEPTIDE - Abnormal; Notable for the following:    B Natriuretic Peptide 982.2 (*)    All other components within normal limits  D-DIMER, QUANTITATIVE (NOT AT [facility]) - Abnormal; Notable for the following:    D-Dimer, Quant 3.50 (*)    All other components within normal limits  BASIC METABOLIC PANEL - Abnormal; Notable for the following:    Glucose, Bld 103 (*)    Creatinine, Ser 1.11 (*)    Calcium 8.1 (*)    GFR calc non Af Amer 59 (*)    All other components within normal limits  TROPONIN I - Abnormal; Notable for the following:    Troponin I 0.07 (*)    All other components within normal limits  TROPONIN I - Abnormal; Notable for the following:    Troponin I 0.05 (*)    All other components within normal limits  BASIC METABOLIC PANEL - Abnormal; Notable for the following:    Potassium 3.3 (*)    Creatinine, Ser 1.12 (*)    Calcium 7.8 (*)    GFR calc non Af Amer 58 (*)    All other components within normal limits  CBC - Abnormal; Notable for the following:    RBC 5.33 (*)    MCH 25.5 (*)    All other components within normal limits  CREATININE, SERUM - Abnormal; Notable for the following:    Creatinine, Ser 1.13 (*)    GFR calc non Af Amer 57 (*)    All other components within normal limits  BASIC METABOLIC PANEL - Abnormal; Notable for the  following:    Creatinine, Ser 1.06 (*)    Calcium 7.9 (*)    All other components within normal limits  TROPONIN I  TSH  PREGNANCY, URINE  I-STAT TROPOININ, ED  I-STAT CHEM 8, ED    Imaging Review No results found.   EKG Interpretation   Date/Time:  Saturday August 04 2014 14:50:41 EDT Ventricular Rate:  108 PR Interval:  148 QRS Duration: 74 QT Interval:  324 QTC Calculation: 434 R Axis:   -55 Text Interpretation:  Unusual P axis, possible ectopic atrial tachycardia  Left axis deviation Possible Anterior infarct , age undetermined Abnormal  ECG No significant change since last tracing Confirmed by Delynda Sepulveda  MD,  Meah Jiron ([id number]) on 08/04/2014 4:24:40 PM      MDM  Final diagnoses:  SOB (shortness of breath)  Congestive heart failure, unspecified congestive heart failure chronicity, unspecified congestive heart failure type        Nelva Nay, MD 08/07/14 [phone]

## 2014-08-04 NOTE — ED Notes (Signed)
Pt c/o shortness of breath onset Tuesday. Pt seen at [facility] last night for the same. Pt reports she was only given prescription for pain medications.

## 2014-08-04 NOTE — ED Notes (Signed)
Pt going to CT at this time.

## 2014-08-04 NOTE — ED Notes (Signed)
Attempted to call report

## 2014-08-04 NOTE — H&P (Signed)
PCP:   No primary care provider on file.   Chief Complaint:  Leg swelling and sob  HPI: 44 yo female h/o htn, asthma comes in with 5 days of worsening le swelling and sob.  Denies any fevers, cough.  No pain.  No pnd or orthopnea but sob worse with exertion.  No h/o chf.  Has been off bp meds for several years due to weight loss.  She has never had heart attack.  No liver issues.  Pt in new onset chf.  Asked to admit for evaluation.  Review of Systems:  Positive and negative as per HPI otherwise all other systems are negative  Past Medical History: Past Medical History  Diagnosis Date  . Asthma    History reviewed. No pertinent past surgical history.  Medications: Prior to Admission medications   Medication Sig Start Date End Date Taking? Authorizing Provider  ranitidine (ZANTAC) 150 MG tablet Take 1 tablet (150 mg total) by mouth 2 (two) times daily. 07/16/14  Yes Gilda Crease, MD  sucralfate (CARAFATE) 1 GM/10ML suspension Take 10 mLs (1 g total) by mouth 4 (four) times daily -  with meals and at bedtime. 07/16/14  Yes Gilda Crease, MD  HYDROcodone-acetaminophen (NORCO/VICODIN) 5-325 MG per tablet Take 1-2 tablets by mouth every 6 (six) hours as needed. Patient not taking: Reported on 08/04/2014 08/03/14   Vanetta Mulders, MD    Allergies:   Allergies  Allergen Reactions  . Tramadol Swelling    Facial swelling    Social History:  reports that she has never smoked. She does not have any smokeless tobacco history on file. She reports that she does not drink alcohol or use illicit drugs.  Family History: htn  Physical Exam: Filed Vitals:   08/04/14 1700 08/04/14 1800 08/04/14 1930 08/04/14 1933  BP: 142/98 136/80 153/89   Pulse: 95 98 105   Temp:      Resp: Height:      Weight:      SpO2: 100% 98% 100% 100%   General appearance: alert, cooperative and no distress Head: Normocephalic, without obvious abnormality, atraumatic Eyes:  negative Nose: Nares normal. Septum midline. Mucosa normal. No drainage or sinus tenderness. Neck: no JVD and supple, symmetrical, trachea midline Lungs: clear to auscultation bilaterally Heart: regular rate and rhythm, S1, S2 normal, no murmur, click, rub or gallop Abdomen: soft, non-tender; bowel sounds normal; no masses,  no organomegaly Extremities: edema 1+ ble ankles Pulses: 2+ and symmetric Skin: Skin color, texture, turgor normal. No rashes or lesions Neurologic: Grossly normal   Labs on Admission:   Recent Labs  08/03/14 1941 08/04/14 1626  NA 139 141  K 3.9 4.1  CL 109 108  CO2 21*  --   GLUCOSE 82 96  BUN 17 17  CREATININE 1.09* 1.00  CALCIUM 8.4*  --     Recent Labs  08/03/14 1941  AST 27  ALT 37  ALKPHOS 55  BILITOT 0.9  PROT 6.1*  ALBUMIN 3.2*    Recent Labs  08/03/14 1941  LIPASE 16*    Recent Labs  08/03/14 1941 08/04/14 1626  WBC 7.1  --   NEUTROABS 3.8  --   HGB 13.0 15.0  HCT 40.7 44.0  MCV 81.9  --   PLT 227  --    Radiological Exams on Admission: Dg Chest 2 View  08/03/2014   CLINICAL DATA:  Chest pain, shortness of breath, nonproductive cough  EXAM: CHEST  2 VIEW  COMPARISON:  07/16/2014  FINDINGS: Lungs are clear.  No pleural effusion or pneumothorax.  Cardiomegaly.  Visualized osseous structures are within normal limits.  IMPRESSION: Cardiomegaly.   Electronically Signed   By: Charline Bills M.D.   On: 08/03/2014 21:50   Dg Chest 2 View  07/16/2014   CLINICAL DATA:  Chest pain and difficulty breathing  EXAM: CHEST  2 VIEW  COMPARISON:  None.  FINDINGS: Cardiac shadow is enlarged. The lungs are clear bilaterally. No bony abnormality is noted.  IMPRESSION: Cardiomegaly without acute pulmonary abnormality.   Electronically Signed   By: Alcide Clever M.D.   On: 07/16/2014 16:28   Ct Angio Chest Pe W/cm &/or Wo Cm  08/04/2014   CLINICAL DATA:  Shortness of breath for 4 days. Evaluate for pulmonary embolism. Initial encounter.  EXAM:  CT ANGIOGRAPHY CHEST WITH CONTRAST  TECHNIQUE: Multidetector CT imaging of the chest was performed using the standard protocol during bolus administration of intravenous contrast. Multiplanar CT image reconstructions and MIPs were obtained to evaluate the vascular anatomy.  CONTRAST:  OMNIPAQUE IOHEXOL 350 MG/ML SOLN  COMPARISON:  Radiographs 08/03/2014.  Abdominal CT 08/03/2014.  FINDINGS: Mediastinum: The pulmonary arteries are well opacified with contrast. There is no evidence of acute pulmonary embolism. There is limited opacification of the aorta which demonstrates no definite abnormality.Cardiomegaly and a small pericardial effusion are again noted. There are no enlarged mediastinal, hilar or axillary lymph nodes. The thyroid gland, trachea and esophagus demonstrate no significant findings.  Lungs/Pleura: There is no pleural effusion.The lungs demonstrate mosaic attenuation without focal airspace disease, suspicious nodule or endobronchial lesion.  Upper abdomen: There is reflux of contrast into the IVC and hepatic veins. No other significant findings.  Musculoskeletal/Chest wall: No chest wall lesion or acute osseous findings.Mild generalized soft tissue edema.  Review of the MIP images confirms the above findings.  IMPRESSION: 1. No evidence of acute pulmonary embolism. 2. Cardiomegaly and small pericardial effusion are noted. There is some reflux of contrast into the IVC and hepatic veins, again suggesting right heart failure or insufficiency. The size of the pericardial effusion does not suggest tamponade. Consider echocardiography for further evaluation. 3. Mild generalized soft tissue edema.   Electronically Signed   By: Carey Bullocks M.D.   On: 08/04/2014 19:01   Ct Abdomen Pelvis W Contrast  08/03/2014   CLINICAL DATA:  Acute onset of lower extremity and abdominal swelling. Lower abdominal pain. Initial encounter.  EXAM: CT ABDOMEN AND PELVIS WITH CONTRAST  TECHNIQUE: Multidetector CT  imaging of the abdomen and pelvis was performed using the standard protocol following bolus administration of intravenous contrast.  CONTRAST:  OMNIPAQUE IOHEXOL 300 MG/ML  SOLN  COMPARISON:  None.  FINDINGS: The visualized lung bases are clear. A trace pericardial effusion is noted. The heart is mildly enlarged.  Mild soft tissue edema is noted about the abdominal and pelvic wall bilaterally, and at the umbilicus. There is nonspecific vague heterogeneity within the liver. Would correlate for any evidence of venous congestion (nutmeg liver). Heterogeneity of the spleen likely reflects the phase of contrast enhancement.  The initial scan was performed 60 seconds after administration of contrast. The arterial enhancement pattern raises concern for some degree of underlying cardiac insufficiency or failure.  Mild gallbladder wall thickening is nonspecific in the presence of ascites. Trace ascites is noted about the spleen and along the paracolic gutters. The pancreas and adrenal glands are unremarkable in appearance.  A 1.5 cm cyst is  noted at the anterior aspect of the left kidney. The kidneys are otherwise unremarkable. There is no evidence of hydronephrosis. No renal or ureteral stones are seen. No perinephric stranding is appreciated.  The small bowel is unremarkable in appearance. The stomach is within normal limits. No acute vascular abnormalities are seen.  The appendix is not definitely characterized; there is no evidence for appendicitis. The colon is unremarkable in appearance.  Vague nonspecific soft tissue stranding is noted about the bladder, and diffuse presacral edema is seen.  The bladder is mildly distended and grossly unremarkable. The uterus is within normal limits. The ovaries are relatively symmetric. No suspicious adnexal masses are seen. No inguinal lymphadenopathy is seen.  No acute osseous abnormalities are identified.  IMPRESSION: 1. The visualized arterial enhancement pattern at 60  seconds delay raises concern for some degree of underlying cardiac insufficiency or failure. 2. Mild diffuse soft tissue edema about the abdominal and pelvic wall bilaterally, and at the umbilicus. 3. Mild cardiomegaly noted.  Trace pericardial effusion seen. 4. Nonspecific vague heterogeneity within the liver. Would correlate for any evidence of venous congestion (nutmeg liver). 5. Trace ascites noted about the spleen and along the paracolic gutters. Mild gallbladder wall thickening is nonspecific in the presence of ascites. 6. Vague nonspecific soft tissue edema about the gallbladder, and diffuse presacral edema noted. 7. Small left renal cyst noted.   Electronically Signed   By: Roanna Raider M.D.   On: 08/03/2014 22:03   US Abdomen Limited Ruq  07/16/2014   CLINICAL DATA:  Right upper quadrant pain  EXAM: US ABDOMEN LIMITED - RIGHT UPPER QUADRANT  COMPARISON:  None.  FINDINGS: Gallbladder:  Well distended with diffuse gallbladder wall thickening and gallbladder wall edema. No calculi are seen.  Common bile duct:  Diameter: 2.6 mm.  Liver:  Within normal limits.  IMPRESSION: Diffusely thickened gallbladder wall without evidence of cholelithiasis. These changes would be consistent with acalculous cholecystitis. Correlation with the physical exam is recommended.   Electronically Signed   By: Alcide Clever M.D.   On: 07/16/2014 16:41   cxr from yesterday reviewed CM no edema ekg reviewed nsr no acute changes, ?old anterior infarct Case discussed with edp dr Radford Pax Old records reviewed  Assessment/Plan  44 yo female with h/o htn, asthma with new onset congestive heart failure  Principal Problem:   Acute CHF (congestive heart failure) new onset-- chf pathway.  Order cardiac echo for am.  Place on lasix  iv q 24 hours.  Has not been on ace inhibitor will start on entresto.  Will need to be set up with chf team.  Serial trop overnight and check tsh level.  Monitor closely for any respiratory  deterioration.  Will need cardiac evaluation at some point.  Active Problems:   SOB (shortness of breath)- secondary to above   HTN (hypertension)- not on chronic medications for her htn   Asthma- stable, and mild  obs on tele bed.  FULL CODE.  Kahil Agner A 08/04/2014, 9:06 PM

## 2014-08-04 NOTE — ED Notes (Signed)
Per MD do not give bolus of NS.

## 2014-08-04 NOTE — ED Notes (Signed)
Admitting MD at bedside.

## 2014-08-04 NOTE — ED Notes (Signed)
Pt made aware of bed assignment 

## 2014-08-05 ENCOUNTER — Inpatient Hospital Stay (HOSPITAL_COMMUNITY): Payer: 59

## 2014-08-05 ENCOUNTER — Encounter (HOSPITAL_COMMUNITY): Payer: Self-pay | Admitting: *Deleted

## 2014-08-05 DIAGNOSIS — I509 Heart failure, unspecified: Secondary | ICD-10-CM

## 2014-08-05 DIAGNOSIS — I10 Essential (primary) hypertension: Secondary | ICD-10-CM

## 2014-08-05 DIAGNOSIS — J45909 Unspecified asthma, uncomplicated: Secondary | ICD-10-CM

## 2014-08-05 LAB — TROPONIN I
TROPONIN I: 0.07 ng/mL — AB (ref <0.031)
Troponin I: 0.05 ng/mL — ABNORMAL HIGH (ref <0.031)
Troponin I: 0.03 ng/mL (ref <0.031)

## 2014-08-05 LAB — BASIC METABOLIC PANEL
Anion gap: 8 (ref 5–15)
BUN: 12 mg/dL (ref 6–20)
CALCIUM: 8.1 mg/dL — AB (ref 8.9–10.3)
CO2: 23 mmol/L (ref 22–32)
Chloride: 106 mmol/L (ref 101–111)
Creatinine, Ser: 1.11 mg/dL — ABNORMAL HIGH (ref 0.44–1.00)
GFR calc non Af Amer: 59 mL/min — ABNORMAL LOW (ref >60)
Glucose, Bld: 103 mg/dL — ABNORMAL HIGH (ref 65–99)
Potassium: 3.9 mmol/L (ref 3.5–5.1)
SODIUM: 137 mmol/L (ref 135–145)

## 2014-08-05 LAB — TSH: TSH: 1.907 u[IU]/mL (ref 0.350–4.500)

## 2014-08-05 MED ORDER — CARVEDILOL 6.25 MG PO TABS
6.2500 mg | ORAL_TABLET | Freq: Two times a day (BID) | ORAL | Status: DC
  Administered 2014-08-05: 6.25 mg via ORAL
  Filled 2014-08-05 (×4): qty 1

## 2014-08-05 MED ORDER — LOSARTAN POTASSIUM 50 MG PO TABS
50.0000 mg | ORAL_TABLET | Freq: Every day | ORAL | Status: DC
  Administered 2014-08-05 – 2014-08-07 (×3): 50 mg via ORAL
  Filled 2014-08-05 (×3): qty 1

## 2014-08-05 NOTE — H&P (Deleted)
[facility] PROGRESS NOTE   Pamela Brooks ZOX:[id number] DOB: 1970-05-17 DOA: 08/04/2014 PCP: No primary care provider on file.  HPI/Subjective: Feels much better today after initiation of Lasix. Still has some shortness of breath.  Assessment/Plan: Principal Problem:   Acute CHF (congestive heart failure) Active Problems:   SOB (shortness of breath)   HTN (hypertension)   Asthma   CHF (congestive heart failure)   Acute CHF Presented with SOB, orthopnea, lower extremity edema and elevated BNP of 982. Pending 2-D echocardiogram.  Patient was given 1 L of normal saline bolus in the ED for unknown reason to me??? Continue Lasix, Entresto started on admission, not currently on beta blockers. I will consult cardiology as new onset CHF.  Hypertension Has patient said her blood pressure was doing okay after she lost about 30 pounds last year. Blood pressure is elevated, improved after starting heart failure medications.  Elevated troponin Patient has slightly elevated troponin of 0.5 and 0.6, likely secondary to acute CHF. Obtained 2-D echocardiogram she is there is any wall motion abnormalities.    Code Status: Full Code Family Communication: Plan discussed with the patient. Disposition Plan: Remains inpatient Diet:    Consultants:  Cardiology   Procedures:  None   Antibiotics: None  Subjective: Filed Vitals:   08/05/14 0546  BP: 133/87  Pulse: 91  Temp: 98 F (36.7 C)  Resp: 20    Intake/Output Summary (Last 24 hours) at 08/05/14 1151 Last data filed at 08/05/14 1106  Gross per 24 hour  Intake    540 ml  Output   3150 ml  Net  -2610 ml   Filed Weights   08/04/14 1447 08/04/14 2306 08/05/14 0546  Weight: 85.73 kg (189 lb) 86.229 kg (190 lb 1.6 oz) 85.276 kg (188 lb)    Exam: General: Alert and awake, oriented x3, not in any acute distress. HEENT: anicteric sclera, pupils reactive to light and accommodation, EOMI CVS: S1-S2 clear, no murmur  rubs or gallops Chest: clear to auscultation bilaterally, no wheezing, rales or rhonchi Abdomen: soft nontender, nondistended, normal bowel sounds, no organomegaly Extremities: no cyanosis, clubbing or edema noted bilaterally Neuro: Cranial nerves II-XII intact, no focal neurological deficits  Data Reviewed: Basic Metabolic Panel:  Recent Labs Lab 08/03/14 1941 08/04/14 1626 08/05/14 0501  NA 139 141 137  K 3.9 4.1 3.9  CL 109 108 106  CO2 21*  --  23  GLUCOSE 82 96 103*  BUN CREATININE 1.09* 1.00 1.11*  CALCIUM 8.4*  --  8.1*   Liver Function Tests:  Recent Labs Lab 08/03/14 1941  AST 27  ALT 37  ALKPHOS 55  BILITOT 0.9  PROT 6.1*  ALBUMIN 3.2*    Recent Labs Lab 08/03/14 1941  LIPASE 16*   No results for input(s): AMMONIA in the last 168 hours. CBC:  Recent Labs Lab 08/03/14 1941 08/04/14 1626  WBC 7.1  --   NEUTROABS 3.8  --   HGB 13.0 15.0  HCT 40.7 44.0  MCV 81.9  --   PLT 227  --    Cardiac Enzymes:  Recent Labs Lab 08/05/14 0011 08/05/14 0501  TROPONINI 0.07* 0.05*   BNP (last 3 results)  Recent Labs  08/04/14 1616  BNP 982.2*    ProBNP (last 3 results) No results for input(s): PROBNP in the last 8760 hours.  CBG: No results for input(s): GLUCAP in the last 168 hours.  Micro No results found for this or any previous  visit (from the past 240 hour(s)).   Studies: Dg Chest 2 View  08/03/2014   CLINICAL DATA:  Chest pain, shortness of breath, nonproductive cough  EXAM: CHEST  2 VIEW  COMPARISON:  07/16/2014  FINDINGS: Lungs are clear.  No pleural effusion or pneumothorax.  Cardiomegaly.  Visualized osseous structures are within normal limits.  IMPRESSION: Cardiomegaly.   Electronically Signed   By: Charline Bills M.D.   On: 08/03/2014 21:50   Ct Angio Chest Pe W/cm &/or Wo Cm  08/04/2014   CLINICAL DATA:  Shortness of breath for 4 days. Evaluate for pulmonary embolism. Initial encounter.  EXAM: CT ANGIOGRAPHY CHEST  WITH CONTRAST  TECHNIQUE: Multidetector CT imaging of the chest was performed using the standard protocol during bolus administration of intravenous contrast. Multiplanar CT image reconstructions and MIPs were obtained to evaluate the vascular anatomy.  CONTRAST:  OMNIPAQUE IOHEXOL 350 MG/ML SOLN  COMPARISON:  Radiographs 08/03/2014.  Abdominal CT 08/03/2014.  FINDINGS: Mediastinum: The pulmonary arteries are well opacified with contrast. There is no evidence of acute pulmonary embolism. There is limited opacification of the aorta which demonstrates no definite abnormality.Cardiomegaly and a small pericardial effusion are again noted. There are no enlarged mediastinal, hilar or axillary lymph nodes. The thyroid gland, trachea and esophagus demonstrate no significant findings.  Lungs/Pleura: There is no pleural effusion.The lungs demonstrate mosaic attenuation without focal airspace disease, suspicious nodule or endobronchial lesion.  Upper abdomen: There is reflux of contrast into the IVC and hepatic veins. No other significant findings.  Musculoskeletal/Chest wall: No chest wall lesion or acute osseous findings.Mild generalized soft tissue edema.  Review of the MIP images confirms the above findings.  IMPRESSION: 1. No evidence of acute pulmonary embolism. 2. Cardiomegaly and small pericardial effusion are noted. There is some reflux of contrast into the IVC and hepatic veins, again suggesting right heart failure or insufficiency. The size of the pericardial effusion does not suggest tamponade. Consider echocardiography for further evaluation. 3. Mild generalized soft tissue edema.   Electronically Signed   By: Carey Bullocks M.D.   On: 08/04/2014 19:01   Ct Abdomen Pelvis W Contrast  08/03/2014   CLINICAL DATA:  Acute onset of lower extremity and abdominal swelling. Lower abdominal pain. Initial encounter.  EXAM: CT ABDOMEN AND PELVIS WITH CONTRAST  TECHNIQUE: Multidetector CT imaging of the abdomen and  pelvis was performed using the standard protocol following bolus administration of intravenous contrast.  CONTRAST:  OMNIPAQUE IOHEXOL 300 MG/ML  SOLN  COMPARISON:  None.  FINDINGS: The visualized lung bases are clear. A trace pericardial effusion is noted. The heart is mildly enlarged.  Mild soft tissue edema is noted about the abdominal and pelvic wall bilaterally, and at the umbilicus. There is nonspecific vague heterogeneity within the liver. Would correlate for any evidence of venous congestion (nutmeg liver). Heterogeneity of the spleen likely reflects the phase of contrast enhancement.  The initial scan was performed 60 seconds after administration of contrast. The arterial enhancement pattern raises concern for some degree of underlying cardiac insufficiency or failure.  Mild gallbladder wall thickening is nonspecific in the presence of ascites. Trace ascites is noted about the spleen and along the paracolic gutters. The pancreas and adrenal glands are unremarkable in appearance.  A 1.5 cm cyst is noted at the anterior aspect of the left kidney. The kidneys are otherwise unremarkable. There is no evidence of hydronephrosis. No renal or ureteral stones are seen. No perinephric stranding is appreciated.  The small bowel  is unremarkable in appearance. The stomach is within normal limits. No acute vascular abnormalities are seen.  The appendix is not definitely characterized; there is no evidence for appendicitis. The colon is unremarkable in appearance.  Vague nonspecific soft tissue stranding is noted about the bladder, and diffuse presacral edema is seen.  The bladder is mildly distended and grossly unremarkable. The uterus is within normal limits. The ovaries are relatively symmetric. No suspicious adnexal masses are seen. No inguinal lymphadenopathy is seen.  No acute osseous abnormalities are identified.  IMPRESSION: 1. The visualized arterial enhancement pattern at 60 seconds delay raises concern  for some degree of underlying cardiac insufficiency or failure. 2. Mild diffuse soft tissue edema about the abdominal and pelvic wall bilaterally, and at the umbilicus. 3. Mild cardiomegaly noted.  Trace pericardial effusion seen. 4. Nonspecific vague heterogeneity within the liver. Would correlate for any evidence of venous congestion (nutmeg liver). 5. Trace ascites noted about the spleen and along the paracolic gutters. Mild gallbladder wall thickening is nonspecific in the presence of ascites. 6. Vague nonspecific soft tissue edema about the gallbladder, and diffuse presacral edema noted. 7. Small left renal cyst noted.   Electronically Signed   By: Roanna Raider M.D.   On: 08/03/2014 22:03    Scheduled Meds: . aspirin EC  81 mg Oral Daily  . furosemide  40 mg Intravenous Daily  . sacubitril-valsartan  1 tablet Oral BID  . sodium chloride  3 mL Intravenous Q12H   Continuous Infusions:      Time spent: 35 minutes    [facility] A  [facility] Pager [phone] If 7PM-7AM, please contact night-coverage at [email or web address], password [facility] 08/05/2014, 11:51 AM  LOS: 1 day

## 2014-08-05 NOTE — Consult Note (Signed)
CARDIOLOGY CONSULT NOTE       Patient ID: Pamela Brooks MRN: [id number] DOB/AGE: [birth year] 46 y.o.  Admit date: 08/04/2014 Referring Physician:  Arthor Captain Primary Physician: No primary care provider on file. Primary Cardiologist:  New Reason for Consultation:  CHF   Principal Problem:   Acute CHF (congestive heart failure) Active Problems:   SOB (shortness of breath)   HTN (hypertension)   Asthma   CHF (congestive heart failure)   HPI:   44 y.o. black female recently moved here.  Living in [place]  Has had HTN for years without good Rx.  5 days of CHF symptoms with PND exertional dyspnea. And LE edema.  Given saline in ER  Started on entresto  No previous cardiac history No family history of CHF.  She has not seen MD in a long time and has no primary but is trying to get into see Gareth Morgan who her parents saw.  No chest pain palpitations or syncope CXR with CE no frank CHF  BNP over 900 CT no PE but pericardial effusion and signs of right sided volume overload   ROS All other systems reviewed and negative except as noted above  Past Medical History  Diagnosis Date  . Asthma     History reviewed. No pertinent family history.  History   Social History  . Marital Status: Single    Spouse Name: N/A  . Number of Children: N/A  . Years of Education: N/A   Occupational History  . Not on file.   Social History Main Topics  . Smoking status: Never Smoker   . Smokeless tobacco: Not on file  . Alcohol Use: No  . Drug Use: No  . Sexual Activity: Not on file   Other Topics Concern  . Not on file   Social History Narrative    History reviewed. No pertinent past surgical history.   Marland Kitchen aspirin EC  81 mg Oral Daily  . furosemide  40 mg Intravenous Daily  . sacubitril-valsartan  1 tablet Oral BID  . sodium chloride  3 mL Intravenous Q12H      Physical Exam: Blood pressure 133/87, pulse 91, temperature 98 F (36.7 C), temperature source Oral, resp. rate 20, height  5\' 1"  (1.549 m), weight 85.276 kg (188 lb), last menstrual period 07/13/2014, SpO2 98 %.   Affect appropriate Overweight black female  HEENT: normal Neck supple with no adenopathy JVP normal no bruits no thyromegaly Lungs clear with no wheezing and good diaphragmatic motion Heart:  S1/S2 no murmur, no rub, gallop or click PMI enlarged  Abdomen: benighn, BS positve, no tenderness, no AAA no bruit.  No HSM or HJR Distal pulses intact with no bruits No edema Neuro non-focal Skin warm and dry No muscular weakness   Labs:   Lab Results  Component Value Date   WBC 7.1 08/03/2014   HGB 15.0 08/04/2014   HCT 44.0 08/04/2014   MCV 81.9 08/03/2014   PLT 227 08/03/2014    Recent Labs Lab 08/03/14 1941  08/05/14 0501  NA 139  < > 137  K 3.9  < > 3.9  CL 109  < > 106  CO2 21*  --  23  BUN 17  < > 12  CREATININE 1.09*  < > 1.11*  CALCIUM 8.4*  --  8.1*  PROT 6.1*  --   --   BILITOT 0.9  --   --   ALKPHOS 55  --   --   ALT  37  --   --   AST 27  --   --   GLUCOSE 82  < > 103*  < > = values in this interval not displayed. Lab Results  Component Value Date   TROPONINI 0.03 08/05/2014   No results found for: CHOL No results found for: HDL No results found for: LDLCALC No results found for: TRIG No results found for: CHOLHDL No results found for: LDLDIRECT    Radiology: Dg Chest 2 View  08/03/2014   CLINICAL DATA:  Chest pain, shortness of breath, nonproductive cough  EXAM: CHEST  2 VIEW  COMPARISON:  07/16/2014  FINDINGS: Lungs are clear.  No pleural effusion or pneumothorax.  Cardiomegaly.  Visualized osseous structures are within normal limits.  IMPRESSION: Cardiomegaly.   Electronically Signed   By: Charline Bills M.D.   On: 08/03/2014 21:50   Dg Chest 2 View  07/16/2014   CLINICAL DATA:  Chest pain and difficulty breathing  EXAM: CHEST  2 VIEW  COMPARISON:  None.  FINDINGS: Cardiac shadow is enlarged. The lungs are clear bilaterally. No bony abnormality is noted.   IMPRESSION: Cardiomegaly without acute pulmonary abnormality.   Electronically Signed   By: Alcide Clever M.D.   On: 07/16/2014 16:28   Ct Angio Chest Pe W/cm &/or Wo Cm  08/04/2014   CLINICAL DATA:  Shortness of breath for 4 days. Evaluate for pulmonary embolism. Initial encounter.  EXAM: CT ANGIOGRAPHY CHEST WITH CONTRAST  TECHNIQUE: Multidetector CT imaging of the chest was performed using the standard protocol during bolus administration of intravenous contrast. Multiplanar CT image reconstructions and MIPs were obtained to evaluate the vascular anatomy.  CONTRAST:  OMNIPAQUE IOHEXOL 350 MG/ML SOLN  COMPARISON:  Radiographs 08/03/2014.  Abdominal CT 08/03/2014.  FINDINGS: Mediastinum: The pulmonary arteries are well opacified with contrast. There is no evidence of acute pulmonary embolism. There is limited opacification of the aorta which demonstrates no definite abnormality.Cardiomegaly and a small pericardial effusion are again noted. There are no enlarged mediastinal, hilar or axillary lymph nodes. The thyroid gland, trachea and esophagus demonstrate no significant findings.  Lungs/Pleura: There is no pleural effusion.The lungs demonstrate mosaic attenuation without focal airspace disease, suspicious nodule or endobronchial lesion.  Upper abdomen: There is reflux of contrast into the IVC and hepatic veins. No other significant findings.  Musculoskeletal/Chest wall: No chest wall lesion or acute osseous findings.Mild generalized soft tissue edema.  Review of the MIP images confirms the above findings.  IMPRESSION: 1. No evidence of acute pulmonary embolism. 2. Cardiomegaly and small pericardial effusion are noted. There is some reflux of contrast into the IVC and hepatic veins, again suggesting right heart failure or insufficiency. The size of the pericardial effusion does not suggest tamponade. Consider echocardiography for further evaluation. 3. Mild generalized soft tissue edema.   Electronically  Signed   By: Carey Bullocks M.D.   On: 08/04/2014 19:01   Ct Abdomen Pelvis W Contrast  08/03/2014   CLINICAL DATA:  Acute onset of lower extremity and abdominal swelling. Lower abdominal pain. Initial encounter.  EXAM: CT ABDOMEN AND PELVIS WITH CONTRAST  TECHNIQUE: Multidetector CT imaging of the abdomen and pelvis was performed using the standard protocol following bolus administration of intravenous contrast.  CONTRAST:  OMNIPAQUE IOHEXOL 300 MG/ML  SOLN  COMPARISON:  None.  FINDINGS: The visualized lung bases are clear. A trace pericardial effusion is noted. The heart is mildly enlarged.  Mild soft tissue edema is noted about the abdominal and  pelvic wall bilaterally, and at the umbilicus. There is nonspecific vague heterogeneity within the liver. Would correlate for any evidence of venous congestion (nutmeg liver). Heterogeneity of the spleen likely reflects the phase of contrast enhancement.  The initial scan was performed 60 seconds after administration of contrast. The arterial enhancement pattern raises concern for some degree of underlying cardiac insufficiency or failure.  Mild gallbladder wall thickening is nonspecific in the presence of ascites. Trace ascites is noted about the spleen and along the paracolic gutters. The pancreas and adrenal glands are unremarkable in appearance.  A 1.5 cm cyst is noted at the anterior aspect of the left kidney. The kidneys are otherwise unremarkable. There is no evidence of hydronephrosis. No renal or ureteral stones are seen. No perinephric stranding is appreciated.  The small bowel is unremarkable in appearance. The stomach is within normal limits. No acute vascular abnormalities are seen.  The appendix is not definitely characterized; there is no evidence for appendicitis. The colon is unremarkable in appearance.  Vague nonspecific soft tissue stranding is noted about the bladder, and diffuse presacral edema is seen.  The bladder is mildly distended and  grossly unremarkable. The uterus is within normal limits. The ovaries are relatively symmetric. No suspicious adnexal masses are seen. No inguinal lymphadenopathy is seen.  No acute osseous abnormalities are identified.  IMPRESSION: 1. The visualized arterial enhancement pattern at 60 seconds delay raises concern for some degree of underlying cardiac insufficiency or failure. 2. Mild diffuse soft tissue edema about the abdominal and pelvic wall bilaterally, and at the umbilicus. 3. Mild cardiomegaly noted.  Trace pericardial effusion seen. 4. Nonspecific vague heterogeneity within the liver. Would correlate for any evidence of venous congestion (nutmeg liver). 5. Trace ascites noted about the spleen and along the paracolic gutters. Mild gallbladder wall thickening is nonspecific in the presence of ascites. 6. Vague nonspecific soft tissue edema about the gallbladder, and diffuse presacral edema noted. 7. Small left renal cyst noted.   Electronically Signed   By: Roanna Raider M.D.   On: 08/03/2014 22:03   US Abdomen Limited Ruq  07/16/2014   CLINICAL DATA:  Right upper quadrant pain  EXAM: US ABDOMEN LIMITED - RIGHT UPPER QUADRANT  COMPARISON:  None.  FINDINGS: Gallbladder:  Well distended with diffuse gallbladder wall thickening and gallbladder wall edema. No calculi are seen.  Common bile duct:  Diameter: 2.6 mm.  Liver:  Within normal limits.  IMPRESSION: Diffusely thickened gallbladder wall without evidence of cholelithiasis. These changes would be consistent with acalculous cholecystitis. Correlation with the physical exam is recommended.   Electronically Signed   By: Alcide Clever M.D.   On: 07/16/2014 16:41    EKG:  ST low voltage poor R wave progression cannot r/o anterior MI   ASSESSMENT AND PLAN:  CHF:  Echo pending but suspect systolic heart failure  CT with little coronary calcium and history of unRx HTN.  Pending echo suspect right and left heart cath tomorrow is warranted given diagnosis of  new onset CHF and previously poor medical f/u.  D/C Entresto start diuretic, beta blocker and ARB.  Discussed cath with her including risks and she is willing to proceed.  HTN:  Covered with Rx for CHF    Can f/u in our [place] clinic on d/c   Signed: Charlton Haws 08/05/2014, 1:50 PM

## 2014-08-05 NOTE — Progress Notes (Signed)
Echocardiogram 2D Echocardiogram has been performed.  Dorothey Baseman 08/05/2014, 12:46 PM

## 2014-08-06 ENCOUNTER — Encounter (HOSPITAL_COMMUNITY)
Admission: EM | Disposition: A | Discharge status: Home / Self Care | Payer: 59 | Source: Home / Self Care | Attending: Internal Medicine

## 2014-08-06 ENCOUNTER — Encounter (HOSPITAL_COMMUNITY): Payer: Self-pay | Admitting: Cardiology

## 2014-08-06 ENCOUNTER — Inpatient Hospital Stay (HOSPITAL_COMMUNITY): Payer: 59

## 2014-08-06 DIAGNOSIS — I25110 Atherosclerotic heart disease of native coronary artery with unstable angina pectoris: Secondary | ICD-10-CM

## 2014-08-06 DIAGNOSIS — I5041 Acute combined systolic (congestive) and diastolic (congestive) heart failure: Secondary | ICD-10-CM

## 2014-08-06 DIAGNOSIS — I420 Dilated cardiomyopathy: Secondary | ICD-10-CM | POA: Insufficient documentation

## 2014-08-06 DIAGNOSIS — I428 Other cardiomyopathies: Secondary | ICD-10-CM | POA: Insufficient documentation

## 2014-08-06 DIAGNOSIS — I5021 Acute systolic (congestive) heart failure: Principal | ICD-10-CM

## 2014-08-06 DIAGNOSIS — R0602 Shortness of breath: Secondary | ICD-10-CM

## 2014-08-06 HISTORY — PX: CARDIAC CATHETERIZATION: SHX172

## 2014-08-06 LAB — CBC
HCT: 42.6 % (ref 36.0–46.0)
HEMOGLOBIN: 13.6 g/dL (ref 12.0–15.0)
MCH: 25.5 pg — ABNORMAL LOW (ref 26.0–34.0)
MCHC: 31.9 g/dL (ref 30.0–36.0)
MCV: 79.9 fL (ref 78.0–100.0)
PLATELETS: 261 10*3/uL (ref 150–400)
RBC: 5.33 MIL/uL — AB (ref 3.87–5.11)
RDW: 13.8 % (ref 11.5–15.5)
WBC: 7.5 10*3/uL (ref 4.0–10.5)

## 2014-08-06 LAB — BASIC METABOLIC PANEL
Anion gap: 9 (ref 5–15)
BUN: 6 mg/dL (ref 6–20)
CALCIUM: 7.8 mg/dL — AB (ref 8.9–10.3)
CO2: 25 mmol/L (ref 22–32)
Chloride: 107 mmol/L (ref 101–111)
Creatinine, Ser: 1.12 mg/dL — ABNORMAL HIGH (ref 0.44–1.00)
GFR calc non Af Amer: 58 mL/min — ABNORMAL LOW (ref >60)
GLUCOSE: 87 mg/dL (ref 65–99)
Potassium: 3.3 mmol/L — ABNORMAL LOW (ref 3.5–5.1)
SODIUM: 141 mmol/L (ref 135–145)

## 2014-08-06 LAB — CREATININE, SERUM
CREATININE: 1.13 mg/dL — AB (ref 0.44–1.00)
GFR calc Af Amer: >60 mL/min (ref >60)
GFR calc non Af Amer: 57 mL/min — ABNORMAL LOW (ref >60)

## 2014-08-06 LAB — PREGNANCY, URINE: PREG TEST UR: NEGATIVE

## 2014-08-06 SURGERY — LEFT HEART CATH AND CORONARY ANGIOGRAPHY

## 2014-08-06 MED ORDER — NITROGLYCERIN 1 MG/10 ML FOR IR/CATH LAB
INTRA_ARTERIAL | Status: AC
  Filled 2014-08-06: qty 10

## 2014-08-06 MED ORDER — POTASSIUM CHLORIDE CRYS ER 20 MEQ PO TBCR
40.0000 meq | EXTENDED_RELEASE_TABLET | Freq: Once | ORAL | Status: AC
  Administered 2014-08-06: 40 meq via ORAL
  Filled 2014-08-06: qty 2

## 2014-08-06 MED ORDER — SODIUM CHLORIDE 0.9 % IV SOLN
INTRAVENOUS | Status: DC
  Administered 2014-08-06: 1000 mL via INTRAVENOUS

## 2014-08-06 MED ORDER — SODIUM CHLORIDE 0.9 % IV SOLN: 250.0000 mL | INTRAVENOUS | Status: DC | PRN

## 2014-08-06 MED ORDER — HEPARIN SODIUM (PORCINE) 1000 UNIT/ML IJ SOLN
INTRAMUSCULAR | Status: AC
  Filled 2014-08-06: qty 1

## 2014-08-06 MED ORDER — IOHEXOL 350 MG/ML SOLN
INTRAVENOUS | Status: DC | PRN
  Administered 2014-08-06: 55 mL via INTRAVENOUS

## 2014-08-06 MED ORDER — SODIUM CHLORIDE 0.9 % WEIGHT BASED INFUSION: 3.0000 mL/kg/h | INTRAVENOUS | Status: DC

## 2014-08-06 MED ORDER — SODIUM CHLORIDE 0.9 % IJ SOLN: 3.0000 mL | INTRAMUSCULAR | Status: DC | PRN

## 2014-08-06 MED ORDER — POTASSIUM CHLORIDE CRYS ER 20 MEQ PO TBCR
40.0000 meq | EXTENDED_RELEASE_TABLET | Freq: Every day | ORAL | Status: DC
  Administered 2014-08-06 – 2014-08-07 (×2): 40 meq via ORAL
  Filled 2014-08-06 (×2): qty 2

## 2014-08-06 MED ORDER — SODIUM CHLORIDE 0.9 % IJ SOLN
3.0000 mL | Freq: Two times a day (BID) | INTRAMUSCULAR | Status: DC
  Administered 2014-08-06: 3 mL via INTRAVENOUS

## 2014-08-06 MED ORDER — HEPARIN SODIUM (PORCINE) 5000 UNIT/ML IJ SOLN
5000.0000 [IU] | Freq: Three times a day (TID) | INTRAMUSCULAR | Status: DC
  Administered 2014-08-06 – 2014-08-07 (×2): 5000 [IU] via SUBCUTANEOUS
  Filled 2014-08-06 (×3): qty 1

## 2014-08-06 MED ORDER — VERAPAMIL HCL 2.5 MG/ML IV SOLN
INTRAVENOUS | Status: AC
  Filled 2014-08-06: qty 2

## 2014-08-06 MED ORDER — LIDOCAINE HCL (PF) 1 % IJ SOLN
INTRAMUSCULAR | Status: AC
  Filled 2014-08-06: qty 30

## 2014-08-06 MED ORDER — MIDAZOLAM HCL 2 MG/2ML IJ SOLN
INTRAMUSCULAR | Status: AC
  Filled 2014-08-06: qty 2

## 2014-08-06 MED ORDER — HEPARIN (PORCINE) IN NACL 2-0.9 UNIT/ML-% IJ SOLN
INTRAMUSCULAR | Status: AC
  Filled 2014-08-06: qty 1500

## 2014-08-06 MED ORDER — MIDAZOLAM HCL 2 MG/2ML IJ SOLN
INTRAMUSCULAR | Status: DC | PRN
  Administered 2014-08-06: 2 mg via INTRAVENOUS

## 2014-08-06 MED ORDER — FENTANYL CITRATE (PF) 100 MCG/2ML IJ SOLN
INTRAMUSCULAR | Status: DC | PRN
  Administered 2014-08-06: 25 ug via INTRAVENOUS

## 2014-08-06 MED ORDER — SODIUM CHLORIDE 0.9 % IV SOLN: INTRAVENOUS | Status: DC

## 2014-08-06 MED ORDER — SODIUM CHLORIDE 0.9 % IV SOLN
Freq: Once | INTRAVENOUS | Status: AC
  Administered 2014-08-06: 1000 mL via INTRAVENOUS

## 2014-08-06 MED ORDER — FENTANYL CITRATE (PF) 100 MCG/2ML IJ SOLN
INTRAMUSCULAR | Status: AC
  Filled 2014-08-06: qty 2

## 2014-08-06 MED ORDER — CARVEDILOL 6.25 MG PO TABS
6.2500 mg | ORAL_TABLET | Freq: Two times a day (BID) | ORAL | Status: DC
  Administered 2014-08-06 – 2014-08-07 (×3): 6.25 mg via ORAL
  Filled 2014-08-06 (×5): qty 1

## 2014-08-06 MED ORDER — SODIUM CHLORIDE 0.9 % IJ SOLN
3.0000 mL | Freq: Two times a day (BID) | INTRAMUSCULAR | Status: DC
  Administered 2014-08-07: 3 mL via INTRAVENOUS

## 2014-08-06 MED ORDER — ASPIRIN 81 MG PO CHEW
81.0000 mg | CHEWABLE_TABLET | ORAL | Status: AC
  Administered 2014-08-06: 81 mg via ORAL
  Filled 2014-08-06: qty 1

## 2014-08-06 MED ORDER — HEPARIN (PORCINE) IN NACL 2-0.9 UNIT/ML-% IJ SOLN: INTRAMUSCULAR | Status: DC | PRN

## 2014-08-06 MED ORDER — VERAPAMIL HCL 2.5 MG/ML IV SOLN
INTRAVENOUS | Status: DC | PRN
  Administered 2014-08-06: 12:00:00 via INTRA_ARTERIAL

## 2014-08-06 SURGICAL SUPPLY — 15 items
CATH INFINITI 5FR ANG PIGTAIL (CATHETERS) IMPLANT
CATH OPTITORQUE TIG 4.0 5F (CATHETERS) ×3 IMPLANT
CATH SWAN GANZ 7F STRAIGHT (CATHETERS) ×3 IMPLANT
COVER PRB 48X5XTLSCP FOLD TPE (BAG) ×1 IMPLANT
COVER PROBE 5X48 (BAG) ×2
DEVICE RAD COMP TR BAND LRG (VASCULAR PRODUCTS) ×3 IMPLANT
GLIDESHEATH SLEND A-KIT 6F 22G (SHEATH) ×3 IMPLANT
KIT HEART LEFT (KITS) ×3 IMPLANT
KIT HEART RIGHT NAMIC (KITS) IMPLANT
PACK CARDIAC CATHETERIZATION (CUSTOM PROCEDURE TRAY) ×3 IMPLANT
SHEATH PINNACLE 7F 10CM (SHEATH) ×3 IMPLANT
SYR MEDRAD MARK V 150ML (SYRINGE) IMPLANT
TRANSDUCER W/STOPCOCK (MISCELLANEOUS) ×6 IMPLANT
TUBING CIL FLEX 10 FLL-RA (TUBING) ×3 IMPLANT
WIRE SAFE-T 1.5MM-J .035X260CM (WIRE) ×3 IMPLANT

## 2014-08-06 NOTE — Interval H&P Note (Signed)
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    The various methods of treatment have been discussed with the patient and family. After consideration of risks, benefits and other options for treatment, the patient has consented to  Procedure(s): Right/Left Heart Cath and Coronary Angiography (N/A) With Possible Percutaneous Coronary Intervention as a surgical intervention.  The patient's history has been reviewed, patient examined, no change in status, stable for surgery.  I have reviewed the patient's chart and labs.  Questions were answered to the patient's satisfaction.     HARDING, DAVID W

## 2014-08-06 NOTE — Progress Notes (Signed)
UR COMPLETED  

## 2014-08-06 NOTE — H&P (Deleted)
[facility] PROGRESS NOTE   Pamela Brooks JXB:[id number] DOB: [birth year] DOA: 08/04/2014 PCP: No primary care provider on file.  HPI/Subjective: 2-D echo done and showed ejection fraction of 25-30%. Feels much better.  Assessment/Plan: Principal Problem:   Acute CHF (congestive heart failure) Active Problems:   SOB (shortness of breath)   HTN (hypertension)   Asthma   CHF (congestive heart failure)   Acute CHF Presented with SOB, orthopnea, lower extremity edema and elevated BNP of 982. Cardiology consulted, 2-D echocardiogram showed ejection fraction of 25-30%. Started on Coreg 6.25 mg, Lasix 40 mg and losartan 50 mg. Cardiac catheterization to be done today.  Hypertension Patient said her blood pressure was doing okay after she lost about 30 pounds last year. Blood pressure is elevated, improved after starting heart failure medications.  Elevated troponin Patient has slightly elevated troponin of 0.5 and 0.6, likely secondary to acute CHF. Obtained 2-D echocardiogram she is there is any wall motion abnormalities.   Hypokalemia Likely secondary to diuresis, replete with oral supplements.  Code Status: Full Code Family Communication: Plan discussed with the patient. Disposition Plan: Remains inpatient Diet: Diet NPO time specified Except for: Sips with Meds  Consultants:  Cardiology   Procedures:  None   Antibiotics: None  Subjective: Filed Vitals:   08/06/14 1020  BP: 139/91  Pulse:   Temp: 98 F (36.7 C)  Resp: 18    Intake/Output Summary (Last 24 hours) at 08/06/14 1207 Last data filed at 08/06/14 1206  Gross per 24 hour  Intake    720 ml  Output   3500 ml  Net  -2780 ml   Filed Weights   08/04/14 2306 08/05/14 0546 08/06/14 0643  Weight: 86.229 kg (190 lb 1.6 oz) 85.276 kg (188 lb) 85.049 kg (187 lb 8 oz)    Exam: General: Alert and awake, oriented x3, not in any acute distress. HEENT: anicteric sclera, pupils reactive to light and  accommodation, EOMI CVS: S1-S2 clear, no murmur rubs or gallops Chest: clear to auscultation bilaterally, no wheezing, rales or rhonchi Abdomen: soft nontender, nondistended, normal bowel sounds, no organomegaly Extremities: no cyanosis, clubbing or edema noted bilaterally Neuro: Cranial nerves II-XII intact, no focal neurological deficits  Data Reviewed: Basic Metabolic Panel:  Recent Labs Lab 08/03/14 1941 08/04/14 1626 08/05/14 0501 08/06/14 0438  NA 139 141 137 141  K 3.9 4.1 3.9 3.3*  CL 109 108 106 107  CO2 21*  --  23 25  GLUCOSE 82 96 103* 87  BUN CREATININE 1.09* 1.00 1.11* 1.12*  CALCIUM 8.4*  --  8.1* 7.8*   Liver Function Tests:  Recent Labs Lab 08/03/14 1941  AST 27  ALT 37  ALKPHOS 55  BILITOT 0.9  PROT 6.1*  ALBUMIN 3.2*    Recent Labs Lab 08/03/14 1941  LIPASE 16*   No results for input(s): AMMONIA in the last 168 hours. CBC:  Recent Labs Lab 08/03/14 1941 08/04/14 1626  WBC 7.1  --   NEUTROABS 3.8  --   HGB 13.0 15.0  HCT 40.7 44.0  MCV 81.9  --   PLT 227  --    Cardiac Enzymes:  Recent Labs Lab 08/05/14 0011 08/05/14 0501 08/05/14 1116  TROPONINI 0.07* 0.05* 0.03   BNP (last 3 results)  Recent Labs  08/04/14 1616  BNP 982.2*    ProBNP (last 3 results) No results for input(s): PROBNP in the last 8760 hours.  CBG: No results for input(s): GLUCAP  in the last 168 hours.  Micro No results found for this or any previous visit (from the past 240 hour(s)).   Studies: Ct Angio Chest Pe W/cm &/or Wo Cm  08/04/2014   CLINICAL DATA:  Shortness of breath for 4 days. Evaluate for pulmonary embolism. Initial encounter.  EXAM: CT ANGIOGRAPHY CHEST WITH CONTRAST  TECHNIQUE: Multidetector CT imaging of the chest was performed using the standard protocol during bolus administration of intravenous contrast. Multiplanar CT image reconstructions and MIPs were obtained to evaluate the vascular anatomy.  CONTRAST:   OMNIPAQUE IOHEXOL 350 MG/ML SOLN  COMPARISON:  Radiographs 08/03/2014.  Abdominal CT 08/03/2014.  FINDINGS: Mediastinum: The pulmonary arteries are well opacified with contrast. There is no evidence of acute pulmonary embolism. There is limited opacification of the aorta which demonstrates no definite abnormality.Cardiomegaly and a small pericardial effusion are again noted. There are no enlarged mediastinal, hilar or axillary lymph nodes. The thyroid gland, trachea and esophagus demonstrate no significant findings.  Lungs/Pleura: There is no pleural effusion.The lungs demonstrate mosaic attenuation without focal airspace disease, suspicious nodule or endobronchial lesion.  Upper abdomen: There is reflux of contrast into the IVC and hepatic veins. No other significant findings.  Musculoskeletal/Chest wall: No chest wall lesion or acute osseous findings.Mild generalized soft tissue edema.  Review of the MIP images confirms the above findings.  IMPRESSION: 1. No evidence of acute pulmonary embolism. 2. Cardiomegaly and small pericardial effusion are noted. There is some reflux of contrast into the IVC and hepatic veins, again suggesting right heart failure or insufficiency. The size of the pericardial effusion does not suggest tamponade. Consider echocardiography for further evaluation. 3. Mild generalized soft tissue edema.   Electronically Signed   By: Carey Bullocks M.D.   On: 08/04/2014 19:01    Scheduled Meds: . aspirin EC  81 mg Oral Daily  . carvedilol  6.25 mg Oral BID WC  . furosemide  40 mg Intravenous Daily  . losartan  50 mg Oral Daily  . potassium chloride  40 mEq Oral Daily  . potassium chloride  40 mEq Oral Once  . sodium chloride  3 mL Intravenous Q12H  . sodium chloride  3 mL Intravenous Q12H   Continuous Infusions: . sodium chloride 1,000 mL (08/06/14 1019)       Time spent: 35 minutes    [facility] A  [facility] Pager [phone] If 7PM-7AM, please contact  night-coverage at [email or web address], password [email or web address] 08/06/2014, 12:07 PM  LOS: 2 days

## 2014-08-06 NOTE — Progress Notes (Addendum)
Per Dr. Herbie Baltimore, LVEDP only 3, and her vein is too dry and flat to do RHC. Surprising, given she still has decreased breath sound near bilateral base and [name]. However given cath finding, will d/c IV lasix for now. [name].  Potentially will start PO lasix prior to discharge.  Ramond Dial PA Pager: [phone]   Agree with note by Azalee Course PA-C   Data base reviewed . Cath showed minimal CAD. NISCM. Minimal basilar crackles. Clinically improved. Optimize meds including BB, ACE-I, and diuretic. Home 24-48 hours with close OP F/U.   Runell Gess, M.D., FACP, [facility], [facility] [address] [place], [place]  [address]  [phone] 08/06/2014 6:16 PM

## 2014-08-06 NOTE — Progress Notes (Signed)
Pt with iv NSL fluid to infuse at 259ml/hr up to 1700 as noted on order.  Notified Hao that pt BP been running 130/90's if pt need IVF at that rate or amt.  Instructed that he will make changes to the IVF.  Will continue to monitor.  Amanda Pea, Charity fundraiser.

## 2014-08-06 NOTE — Progress Notes (Signed)
 Patient Name: Pamela Brooks Date of Encounter: 08/06/2014  Primary Cardiologist: New    Principal Problem:   Acute CHF (congestive heart failure) Active Problems:   SOB (shortness of breath)   HTN (hypertension)   Asthma   CHF (congestive heart failure)    SUBJECTIVE  SOB improved. No CP since last Friday.   CURRENT MEDS . aspirin EC  81 mg Oral Daily  . carvedilol  6.25 mg Oral BID WC  . furosemide  40 mg Intravenous Daily  . losartan  50 mg Oral Daily  . potassium chloride  40 mEq Oral Daily  . potassium chloride  40 mEq Oral Once  . sodium chloride  3 mL Intravenous Q12H  . sodium chloride  3 mL Intravenous Q12H    OBJECTIVE  Filed Vitals:   08/05/14 1421 08/06/14 0643 08/06/14 0830 08/06/14 1020  BP: 147/96 141/107 140/89 139/91  Pulse: 90 93 93   Temp: 97.8 F (36.6 C) 98 F (36.7 C)  98 F (36.7 C)  TempSrc: Oral Oral    Resp: 20 18  18  Height:      Weight:  187 lb 8 oz (85.049 kg)    SpO2: 100% 100%  100%    Intake/Output Summary (Last 24 hours) at 08/06/14 1109 Last data filed at 08/06/14 1031  Gross per 24 hour  Intake    720 ml  Output   3400 ml  Net  -2680 ml   Filed Weights   08/04/14 2306 08/05/14 0546 08/06/14 0643  Weight: 190 lb 1.6 oz (86.229 kg) 188 lb (85.276 kg) 187 lb 8 oz (85.049 kg)    PHYSICAL EXAM  General: Pleasant, NAD. Neuro: Alert and oriented X 3. Moves all extremities spontaneously. Psych: Normal affect. HEENT:  Normal  Neck: Supple without bruits or JVD. Lungs:  Resp regular and unlabored. Markedly decreased breath sound in bilateral bases on exam.  Heart: RRR no s3, s4, or murmurs. Abdomen: Soft, non-tender, non-distended, BS + x 4.  Extremities: No clubbing, cyanosis. DP/PT/Radials 2+ and equal bilaterally. 1-2+ pitting edema  Accessory Clinical Findings  CBC  Recent Labs  08/03/14 1941 08/04/14 1626  WBC 7.1  --   NEUTROABS 3.8  --   HGB 13.0 15.0  HCT 40.7 44.0  MCV 81.9  --   PLT 227  --     Basic Metabolic Panel  Recent Labs  08/05/14 0501 08/06/14 0438  NA 137 141  K 3.9 3.3*  CL 106 107  CO2 23 25  GLUCOSE 103* 87  BUN 12 6  CREATININE 1.11* 1.12*  CALCIUM 8.1* 7.8*   Liver Function Tests  Recent Labs  08/03/14 1941  AST 27  ALT 37  ALKPHOS 55  BILITOT 0.9  PROT 6.1*  ALBUMIN 3.2*    Recent Labs  08/03/14 1941  LIPASE 16*   Cardiac Enzymes  Recent Labs  08/05/14 0011 08/05/14 0501 08/05/14 1116  TROPONINI 0.07* 0.05* 0.03   BNP Invalid input(s): POCBNP D-Dimer  Recent Labs  08/04/14 1616  DDIMER 3.50*   Thyroid Function Tests  Recent Labs  08/05/14 0011  TSH 1.907    TELE NSR without significant ventricular ectopy    ECG  08/05/2014 NSR with diffuse TWI  Echocardiogram  LV EF: 25% -  30%  ------------------------------------------------------------------- Indications:   CHF - 428.0.  ------------------------------------------------------------------- History:  PMH:  Dyspnea. Risk factors: Hypertension.  ------------------------------------------------------------------- Study Conclusions  - Left ventricle: The cavity size was moderately dilated. Wall thickness   was normal. Systolic function was severely reduced. The estimated ejection fraction was in the range of 25% to 30%. Diffuse hypokinesis. - Mitral valve: There was moderate regurgitation. - Left atrium: The atrium was mildly dilated. - Atrial septum: No defect or patent foramen ovale was identified. - Pericardium, extracardiac: Small pericardial effusion no tamponade    Radiology/Studies  Dg Chest 2 View  08/03/2014   CLINICAL DATA:  Chest pain, shortness of breath, nonproductive cough  EXAM: CHEST  2 VIEW  COMPARISON:  07/16/2014  FINDINGS: Lungs are clear.  No pleural effusion or pneumothorax.  Cardiomegaly.  Visualized osseous structures are within normal limits.  IMPRESSION: Cardiomegaly.   Electronically Signed   By: Sriyesh   Krishnan M.D.   On: 08/03/2014 21:50   Dg Chest 2 View  07/16/2014   CLINICAL DATA:  Chest pain and difficulty breathing  EXAM: CHEST  2 VIEW  COMPARISON:  None.  FINDINGS: Cardiac shadow is enlarged. The lungs are clear bilaterally. No bony abnormality is noted.  IMPRESSION: Cardiomegaly without acute pulmonary abnormality.   Electronically Signed   By: Mark  Lukens M.D.   On: 07/16/2014 16:28   Ct Angio Chest Pe W/cm &/or Wo Cm  08/04/2014   CLINICAL DATA:  Shortness of breath for 4 days. Evaluate for pulmonary embolism. Initial encounter.  EXAM: CT ANGIOGRAPHY CHEST WITH CONTRAST  TECHNIQUE: Multidetector CT imaging of the chest was performed using the standard protocol during bolus administration of intravenous contrast. Multiplanar CT image reconstructions and MIPs were obtained to evaluate the vascular anatomy.  CONTRAST:  100mL OMNIPAQUE IOHEXOL 350 MG/ML SOLN  COMPARISON:  Radiographs 08/03/2014.  Abdominal CT 08/03/2014.  FINDINGS: Mediastinum: The pulmonary arteries are well opacified with contrast. There is no evidence of acute pulmonary embolism. There is limited opacification of the aorta which demonstrates no definite abnormality.Cardiomegaly and a small pericardial effusion are again noted. There are no enlarged mediastinal, hilar or axillary lymph nodes. The thyroid gland, trachea and esophagus demonstrate no significant findings.  Lungs/Pleura: There is no pleural effusion.The lungs demonstrate mosaic attenuation without focal airspace disease, suspicious nodule or endobronchial lesion.  Upper abdomen: There is reflux of contrast into the IVC and hepatic veins. No other significant findings.  Musculoskeletal/Chest wall: No chest wall lesion or acute osseous findings.Mild generalized soft tissue edema.  Review of the MIP images confirms the above findings.  IMPRESSION: 1. No evidence of acute pulmonary embolism. 2. Cardiomegaly and small pericardial effusion are noted. There is some reflux of  contrast into the IVC and hepatic veins, again suggesting right heart failure or insufficiency. The size of the pericardial effusion does not suggest tamponade. Consider echocardiography for further evaluation. 3. Mild generalized soft tissue edema.   Electronically Signed   By: William  Veazey M.D.   On: 08/04/2014 19:01   Ct Abdomen Pelvis W Contrast  08/03/2014   CLINICAL DATA:  Acute onset of lower extremity and abdominal swelling. Lower abdominal pain. Initial encounter.  EXAM: CT ABDOMEN AND PELVIS WITH CONTRAST  TECHNIQUE: Multidetector CT imaging of the abdomen and pelvis was performed using the standard protocol following bolus administration of intravenous contrast.  CONTRAST:  100mL OMNIPAQUE IOHEXOL 300 MG/ML  SOLN  COMPARISON:  None.  FINDINGS: The visualized lung bases are clear. A trace pericardial effusion is noted. The heart is mildly enlarged.  Mild soft tissue edema is noted about the abdominal and pelvic wall bilaterally, and at the umbilicus. There is nonspecific vague heterogeneity within the liver. Would correlate for any evidence   of venous congestion (nutmeg liver). Heterogeneity of the spleen likely reflects the phase of contrast enhancement.  The initial scan was performed 60 seconds after administration of contrast. The arterial enhancement pattern raises concern for some degree of underlying cardiac insufficiency or failure.  Mild gallbladder wall thickening is nonspecific in the presence of ascites. Trace ascites is noted about the spleen and along the paracolic gutters. The pancreas and adrenal glands are unremarkable in appearance.  A 1.5 cm cyst is noted at the anterior aspect of the left kidney. The kidneys are otherwise unremarkable. There is no evidence of hydronephrosis. No renal or ureteral stones are seen. No perinephric stranding is appreciated.  The small bowel is unremarkable in appearance. The stomach is within normal limits. No acute vascular abnormalities are seen.  The  appendix is not definitely characterized; there is no evidence for appendicitis. The colon is unremarkable in appearance.  Vague nonspecific soft tissue stranding is noted about the bladder, and diffuse presacral edema is seen.  The bladder is mildly distended and grossly unremarkable. The uterus is within normal limits. The ovaries are relatively symmetric. No suspicious adnexal masses are seen. No inguinal lymphadenopathy is seen.  No acute osseous abnormalities are identified.  IMPRESSION: 1. The visualized arterial enhancement pattern at 60 seconds delay raises concern for some degree of underlying cardiac insufficiency or failure. 2. Mild diffuse soft tissue edema about the abdominal and pelvic wall bilaterally, and at the umbilicus. 3. Mild cardiomegaly noted.  Trace pericardial effusion seen. 4. Nonspecific vague heterogeneity within the liver. Would correlate for any evidence of venous congestion (nutmeg liver). 5. Trace ascites noted about the spleen and along the paracolic gutters. Mild gallbladder wall thickening is nonspecific in the presence of ascites. 6. Vague nonspecific soft tissue edema about the gallbladder, and diffuse presacral edema noted. 7. Small left renal cyst noted.   Electronically Signed   By: Jeffery  Chang M.D.   On: 08/03/2014 22:03   Us Abdomen Limited Ruq  07/16/2014   CLINICAL DATA:  Right upper quadrant pain  EXAM: US ABDOMEN LIMITED - RIGHT UPPER QUADRANT  COMPARISON:  None.  FINDINGS: Gallbladder:  Well distended with diffuse gallbladder wall thickening and gallbladder wall edema. No calculi are seen.  Common bile duct:  Diameter: 2.6 mm.  Liver:  Within normal limits.  IMPRESSION: Diffusely thickened gallbladder wall without evidence of cholelithiasis. These changes would be consistent with acalculous cholecystitis. Correlation with the physical exam is recommended.   Electronically Signed   By: Mark  Lukens M.D.   On: 07/16/2014 16:41    ASSESSMENT AND PLAN  1. Acute  systolic HF with newly diagnosed LV dysfunction on echo  - ongoing sx with cough for 2 weeks.  - Echo 08/05/2014 EF 25-30%, moderate MR  - still fluid overloaded, currently -5L, however decreased breath sound in bilateral bases, 1-2+ pitting edema in LE, however pt think she can go ahead with L&RHC and lay flat for another  - Likely will need further diuresis based on physical exam, will followup with [facility] as well  2. Recent CP, unclear if related to HF  - last episode of CP was when she went to [place] last Fri, trop 0.07 --> 0.05 --> 0.03  - pending LHC  3. HTN  4. Moderate MR    Signed, Elois Averitt PA-C Pager: [phone]  

## 2014-08-06 NOTE — Plan of Care (Signed)
Problem: Phase II Progression Outcomes Goal: Vascular site scale level 0 - I Vascular Site Scale Level 0: No bruising/bleeding/hematoma Level I (Mild): Bruising/Ecchymosis, minimal bleeding/ooozing, palpable hematoma < 3 cm Level II (Moderate): Bleeding not affecting hemodynamic parameters, pseudoaneurysm, palpable hematoma > 3 cm Level III (Severe) Bleeding which affects hemodynamic parameters or retroperitoneal hemorrhage  Outcome: Completed/Met Date Met:  08/06/14 Level 0

## 2014-08-06 NOTE — H&P (View-Only) (Signed)
Patient Name: Pamela Brooks Date of Encounter: 08/06/2014  Primary Cardiologist: New    Principal Problem:   Acute CHF (congestive heart failure) Active Problems:   SOB (shortness of breath)   HTN (hypertension)   Asthma   CHF (congestive heart failure)    SUBJECTIVE  SOB improved. No CP since last Friday.   CURRENT MEDS . aspirin EC  81 mg Oral Daily  . carvedilol  6.25 mg Oral BID WC  . furosemide  40 mg Intravenous Daily  . losartan  50 mg Oral Daily  . potassium chloride  40 mEq Oral Daily  . potassium chloride  40 mEq Oral Once  . sodium chloride  3 mL Intravenous Q12H  . sodium chloride  3 mL Intravenous Q12H    OBJECTIVE  Filed Vitals:   08/05/14 1421 08/06/14 0643 08/06/14 0830 08/06/14 1020  BP: 147/96 141/107 140/89 139/91  Pulse: 90 93 93   Temp: 97.8 F (36.6 C) 98 F (36.7 C)  98 F (36.7 C)  TempSrc: Oral Oral    Resp: 20 18  18   Height:      Weight:  187 lb 8 oz (85.049 kg)    SpO2: 100% 100%  100%    Intake/Output Summary (Last 24 hours) at 08/06/14 1109 Last data filed at 08/06/14 1031  Gross per 24 hour  Intake    720 ml  Output   3400 ml  Net  -2680 ml   Filed Weights   08/04/14 2306 08/05/14 0546 08/06/14 0643  Weight: 190 lb 1.6 oz (86.229 kg) 188 lb (85.276 kg) 187 lb 8 oz (85.049 kg)    PHYSICAL EXAM  General: Pleasant, NAD. Neuro: Alert and oriented X 3. Moves all extremities spontaneously. Psych: Normal affect. HEENT:  Normal  Neck: Supple without bruits or JVD. Lungs:  Resp regular and unlabored. Markedly decreased breath sound in bilateral bases on exam.  Heart: RRR no s3, s4, or murmurs. Abdomen: Soft, non-tender, non-distended, BS + x 4.  Extremities: No clubbing, cyanosis. DP/PT/Radials 2+ and equal bilaterally. 1-2+ pitting edema  Accessory Clinical Findings  CBC  Recent Labs  08/03/14 1941 08/04/14 1626  WBC 7.1  --   NEUTROABS 3.8  --   HGB 13.0 15.0  HCT 40.7 44.0  MCV 81.9  --   PLT 227  --     Basic Metabolic Panel  Recent Labs  08/05/14 0501 08/06/14 0438  NA 137 141  K 3.9 3.3*  CL 106 107  CO2 23 25  GLUCOSE 103* 87  BUN 12 6  CREATININE 1.11* 1.12*  CALCIUM 8.1* 7.8*   Liver Function Tests  Recent Labs  08/03/14 1941  AST 27  ALT 37  ALKPHOS 55  BILITOT 0.9  PROT 6.1*  ALBUMIN 3.2*    Recent Labs  08/03/14 1941  LIPASE 16*   Cardiac Enzymes  Recent Labs  08/05/14 0011 08/05/14 0501 08/05/14 1116  TROPONINI 0.07* 0.05* 0.03   BNP Invalid input(s): POCBNP D-Dimer  Recent Labs  08/04/14 1616  DDIMER 3.50*   Thyroid Function Tests  Recent Labs  08/05/14 0011  TSH 1.907    TELE NSR without significant ventricular ectopy    ECG  08/05/2014 NSR with diffuse TWI  Echocardiogram  LV EF: 25% -  30%  ------------------------------------------------------------------- Indications:   CHF - 428.0.  ------------------------------------------------------------------- History:  PMH:  Dyspnea. Risk factors: Hypertension.  ------------------------------------------------------------------- Study Conclusions  - Left ventricle: The cavity size was moderately dilated. Wall thickness  was normal. Systolic function was severely reduced. The estimated ejection fraction was in the range of 25% to 30%. Diffuse hypokinesis. - Mitral valve: There was moderate regurgitation. - Left atrium: The atrium was mildly dilated. - Atrial septum: No defect or patent foramen ovale was identified. - Pericardium, extracardiac: Small pericardial effusion no tamponade    Radiology/Studies  Dg Chest 2 View  08/03/2014   CLINICAL DATA:  Chest pain, shortness of breath, nonproductive cough  EXAM: CHEST  2 VIEW  COMPARISON:  07/16/2014  FINDINGS: Lungs are clear.  No pleural effusion or pneumothorax.  Cardiomegaly.  Visualized osseous structures are within normal limits.  IMPRESSION: Cardiomegaly.   Electronically Signed   By: Charline Bills M.D.   On: 08/03/2014 21:50   Dg Chest 2 View  07/16/2014   CLINICAL DATA:  Chest pain and difficulty breathing  EXAM: CHEST  2 VIEW  COMPARISON:  None.  FINDINGS: Cardiac shadow is enlarged. The lungs are clear bilaterally. No bony abnormality is noted.  IMPRESSION: Cardiomegaly without acute pulmonary abnormality.   Electronically Signed   By: Alcide Clever M.D.   On: 07/16/2014 16:28   Ct Angio Chest Pe W/cm &/or Wo Cm  08/04/2014   CLINICAL DATA:  Shortness of breath for 4 days. Evaluate for pulmonary embolism. Initial encounter.  EXAM: CT ANGIOGRAPHY CHEST WITH CONTRAST  TECHNIQUE: Multidetector CT imaging of the chest was performed using the standard protocol during bolus administration of intravenous contrast. Multiplanar CT image reconstructions and MIPs were obtained to evaluate the vascular anatomy.  CONTRAST:  OMNIPAQUE IOHEXOL 350 MG/ML SOLN  COMPARISON:  Radiographs 08/03/2014.  Abdominal CT 08/03/2014.  FINDINGS: Mediastinum: The pulmonary arteries are well opacified with contrast. There is no evidence of acute pulmonary embolism. There is limited opacification of the aorta which demonstrates no definite abnormality.Cardiomegaly and a small pericardial effusion are again noted. There are no enlarged mediastinal, hilar or axillary lymph nodes. The thyroid gland, trachea and esophagus demonstrate no significant findings.  Lungs/Pleura: There is no pleural effusion.The lungs demonstrate mosaic attenuation without focal airspace disease, suspicious nodule or endobronchial lesion.  Upper abdomen: There is reflux of contrast into the IVC and hepatic veins. No other significant findings.  Musculoskeletal/Chest wall: No chest wall lesion or acute osseous findings.Mild generalized soft tissue edema.  Review of the MIP images confirms the above findings.  IMPRESSION: 1. No evidence of acute pulmonary embolism. 2. Cardiomegaly and small pericardial effusion are noted. There is some reflux of  contrast into the IVC and hepatic veins, again suggesting right heart failure or insufficiency. The size of the pericardial effusion does not suggest tamponade. Consider echocardiography for further evaluation. 3. Mild generalized soft tissue edema.   Electronically Signed   By: Carey Bullocks M.D.   On: 08/04/2014 19:01   Ct Abdomen Pelvis W Contrast  08/03/2014   CLINICAL DATA:  Acute onset of lower extremity and abdominal swelling. Lower abdominal pain. Initial encounter.  EXAM: CT ABDOMEN AND PELVIS WITH CONTRAST  TECHNIQUE: Multidetector CT imaging of the abdomen and pelvis was performed using the standard protocol following bolus administration of intravenous contrast.  CONTRAST:  OMNIPAQUE IOHEXOL 300 MG/ML  SOLN  COMPARISON:  None.  FINDINGS: The visualized lung bases are clear. A trace pericardial effusion is noted. The heart is mildly enlarged.  Mild soft tissue edema is noted about the abdominal and pelvic wall bilaterally, and at the umbilicus. There is nonspecific vague heterogeneity within the liver. Would correlate for any evidence  of venous congestion (nutmeg liver). Heterogeneity of the spleen likely reflects the phase of contrast enhancement.  The initial scan was performed 60 seconds after administration of contrast. The arterial enhancement pattern raises concern for some degree of underlying cardiac insufficiency or failure.  Mild gallbladder wall thickening is nonspecific in the presence of ascites. Trace ascites is noted about the spleen and along the paracolic gutters. The pancreas and adrenal glands are unremarkable in appearance.  A 1.5 cm cyst is noted at the anterior aspect of the left kidney. The kidneys are otherwise unremarkable. There is no evidence of hydronephrosis. No renal or ureteral stones are seen. No perinephric stranding is appreciated.  The small bowel is unremarkable in appearance. The stomach is within normal limits. No acute vascular abnormalities are seen.  The  appendix is not definitely characterized; there is no evidence for appendicitis. The colon is unremarkable in appearance.  Vague nonspecific soft tissue stranding is noted about the bladder, and diffuse presacral edema is seen.  The bladder is mildly distended and grossly unremarkable. The uterus is within normal limits. The ovaries are relatively symmetric. No suspicious adnexal masses are seen. No inguinal lymphadenopathy is seen.  No acute osseous abnormalities are identified.  IMPRESSION: 1. The visualized arterial enhancement pattern at 60 seconds delay raises concern for some degree of underlying cardiac insufficiency or failure. 2. Mild diffuse soft tissue edema about the abdominal and pelvic wall bilaterally, and at the umbilicus. 3. Mild cardiomegaly noted.  Trace pericardial effusion seen. 4. Nonspecific vague heterogeneity within the liver. Would correlate for any evidence of venous congestion (nutmeg liver). 5. Trace ascites noted about the spleen and along the paracolic gutters. Mild gallbladder wall thickening is nonspecific in the presence of ascites. 6. Vague nonspecific soft tissue edema about the gallbladder, and diffuse presacral edema noted. 7. Small left renal cyst noted.   Electronically Signed   By: Roanna Raider M.D.   On: 08/03/2014 22:03   US Abdomen Limited Ruq  07/16/2014   CLINICAL DATA:  Right upper quadrant pain  EXAM: US ABDOMEN LIMITED - RIGHT UPPER QUADRANT  COMPARISON:  None.  FINDINGS: Gallbladder:  Well distended with diffuse gallbladder wall thickening and gallbladder wall edema. No calculi are seen.  Common bile duct:  Diameter: 2.6 mm.  Liver:  Within normal limits.  IMPRESSION: Diffusely thickened gallbladder wall without evidence of cholelithiasis. These changes would be consistent with acalculous cholecystitis. Correlation with the physical exam is recommended.   Electronically Signed   By: Alcide Clever M.D.   On: 07/16/2014 16:41    ASSESSMENT AND PLAN  1. Acute  systolic HF with newly diagnosed LV dysfunction on echo  - ongoing sx with cough for 2 weeks.  - Echo 08/05/2014 EF 25-30%, moderate MR  - still fluid overloaded, currently -5L, however decreased breath sound in bilateral bases, 1-2+ pitting edema in LE, however pt think she can go ahead with L&RHC and lay flat for another  - Likely will need further diuresis based on physical exam, will followup with [facility] as well  2. Recent CP, unclear if related to HF  - last episode of CP was when she went to [facility] last Fri, trop 0.07 --> 0.05 --> 0.03  - pending LHC  3. HTN  4. Moderate MR    Signed, Azalee Course PA-C Pager: [phone]

## 2014-08-06 NOTE — Progress Notes (Signed)
Heart Failure Navigator Consult Note  Presentation: Pamela Brooks presented with is a 44 y.o. black female recently moved here. Living in [place] Has had HTN for years without good Rx. 5 days of CHF symptoms with PND exertional dyspnea. And LE edema. Given saline in ER Started on entresto No previous cardiac history No family history of CHF. She has not seen MD in a long time and has no primary but is trying to get into see Gareth Morgan who her parents saw. No chest pain palpitations or syncope CXR with CE no frank CHF BNP over 900 CT no PE but pericardial effusion and signs of right sided volume overload    Past Medical History  Diagnosis Date  . Asthma     History   Social History  . Marital Status: Single    Spouse Name: N/A  . Number of Children: N/A  . Years of Education: N/A   Social History Main Topics  . Smoking status: Never Smoker   . Smokeless tobacco: Not on file  . Alcohol Use: No  . Drug Use: No  . Sexual Activity: Not on file   Other Topics Concern  . None   Social History Narrative    ECHO:Study Conclusions--08/05/14  - Left ventricle: The cavity size was moderately dilated. Wall thickness was normal. Systolic function was severely reduced. The estimated ejection fraction was in the range of 25% to 30%. Diffuse hypokinesis. - Mitral valve: There was moderate regurgitation. - Left atrium: The atrium was mildly dilated. - Atrial septum: No defect or patent foramen ovale was identified. - Pericardium, extracardiac: Small pericardial effusion no tamponade  Transthoracic echocardiography. M-mode, complete 2D, spectral Doppler, and color Doppler. Birthdate: Patient birthdate: October 08, 1970. Age: Patient is 44 yr old. Sex: Gender: female. BMI: 35.5 kg/m^2. Blood pressure:   142/98 Patient status: Inpatient. Study date: Study date: 08/05/2014. Study time: 12:15 PM. Location: Bedside.  BNP    Component Value Date/Time   BNP  982.2* 08/04/2014 1616    ProBNP No results found for: PROBNP   Education Assessment and Provision:  Detailed education and instructions provided on heart failure disease management including the following:  Signs and symptoms of Heart Failure When to call the physician Importance of daily weights Low sodium diet Fluid restriction Medication management Anticipated future follow-up appointments  Patient education given on each of the above topics.  Patient acknowledges understanding and acceptance of all instructions.  I spoke at length with Pamela Brooks regarding her recent diagnosis of HF.  She is for a cardiac catheterization later today.  She does seem to have a basic understanding of HF and HF recommendations.  She does not own a scale and I will provide one for her home use.  We discussed the importance of daily weights and how they relate to signs and symptoms of HF.  We discussed a low sodium diet and high sodium foods to avoid.  I will also request a dietician to reinforce low sodium diet education.  Her BP was elevated on admission to the hospital and I have stressed the importance in taking all prescribed medications after discharge.  She has plans to follow up with [facility] near [place].  Education Materials:  "Living Better With Heart Failure" Booklet, Daily Weight Tracker Tool and Heart Failure Educational Video.   High Risk Criteria for Readmission and/or Poor Patient Outcomes:   EF <30%- yes 25-30%  2 or more admissions in 6 months- No--new HF  Difficult social situation- No  lives with her parents in [place]  Demonstrates medication noncompliance- No    Barriers of Care:  Knowledge of HF, compliance and health literacy  Discharge Planning:   Plans to discharge to home with her parents in [place]

## 2014-08-07 ENCOUNTER — Other Ambulatory Visit: Payer: Self-pay | Admitting: Physician Assistant

## 2014-08-07 ENCOUNTER — Encounter (HOSPITAL_COMMUNITY): Payer: Self-pay | Admitting: Cardiology

## 2014-08-07 DIAGNOSIS — I5020 Unspecified systolic (congestive) heart failure: Secondary | ICD-10-CM

## 2014-08-07 DIAGNOSIS — I429 Cardiomyopathy, unspecified: Secondary | ICD-10-CM

## 2014-08-07 DIAGNOSIS — I5021 Acute systolic (congestive) heart failure: Secondary | ICD-10-CM

## 2014-08-07 DIAGNOSIS — I420 Dilated cardiomyopathy: Secondary | ICD-10-CM

## 2014-08-07 LAB — BASIC METABOLIC PANEL
ANION GAP: 8 (ref 5–15)
BUN: 8 mg/dL (ref 6–20)
CHLORIDE: 109 mmol/L (ref 101–111)
CO2: 25 mmol/L (ref 22–32)
Calcium: 7.9 mg/dL — ABNORMAL LOW (ref 8.9–10.3)
Creatinine, Ser: 1.06 mg/dL — ABNORMAL HIGH (ref 0.44–1.00)
GFR calc non Af Amer: >60 mL/min (ref >60)
Glucose, Bld: 96 mg/dL (ref 65–99)
Potassium: 4 mmol/L (ref 3.5–5.1)
Sodium: 142 mmol/L (ref 135–145)

## 2014-08-07 MED ORDER — FUROSEMIDE 40 MG PO TABS: 40.0000 mg | ORAL_TABLET | Freq: Every day | ORAL | Status: DC

## 2014-08-07 MED ORDER — ASPIRIN 81 MG PO TBEC: 81.0000 mg | DELAYED_RELEASE_TABLET | Freq: Every day | ORAL | Status: AC

## 2014-08-07 MED ORDER — FUROSEMIDE 40 MG PO TABS
40.0000 mg | ORAL_TABLET | Freq: Every day | ORAL | Status: DC
  Administered 2014-08-07: 40 mg via ORAL
  Filled 2014-08-07: qty 1

## 2014-08-07 MED ORDER — LOSARTAN POTASSIUM 50 MG PO TABS: 50.0000 mg | ORAL_TABLET | Freq: Every day | ORAL | Status: DC

## 2014-08-07 MED ORDER — CARVEDILOL 6.25 MG PO TABS: 6.2500 mg | ORAL_TABLET | Freq: Two times a day (BID) | ORAL | Status: DC

## 2014-08-07 MED ORDER — HEPARIN SODIUM (PORCINE) 1000 UNIT/ML IJ SOLN
INTRAMUSCULAR | Status: DC | PRN
  Administered 2014-08-06: 4500 [IU] via INTRAVENOUS

## 2014-08-07 MED FILL — Lidocaine HCl Local Preservative Free (PF) Inj 1%: INTRAMUSCULAR | Qty: 30 | Status: AC

## 2014-08-07 NOTE — Progress Notes (Signed)
Patient Name: Pamela Brooks Date of Encounter: 08/07/2014  Primary Cardiologist: new- Dr. Eden Emms   Principal Problem:   Acute CHF (congestive heart failure) Active Problems:   SOB (shortness of breath)   HTN (hypertension)   Asthma   CHF (congestive heart failure)   Congestive dilated cardiomyopathy   Non-ischemic cardiomyopathy    SUBJECTIVE  Denies any CP. SOB improved.   CURRENT MEDS . aspirin EC  81 mg Oral Daily  . carvedilol  6.25 mg Oral BID WC  . heparin  5,000 Units Subcutaneous 3 times per day  . losartan  50 mg Oral Daily  . potassium chloride  40 mEq Oral Daily  . sodium chloride  3 mL Intravenous Q12H  . sodium chloride  3 mL Intravenous Q12H    OBJECTIVE  Filed Vitals:   08/06/14 1541 08/06/14 1600 08/06/14 2015 08/07/14 0543  BP: 136/98 130/89 132/94 136/100  Pulse: 88 89 102 92  Temp:   98 F (36.7 C) 97.2 F (36.2 C)  TempSrc:   Oral Oral  Resp:   18 18  Height:      Weight:    174 lb 3.2 oz (79.017 kg)  SpO2:   100% 100%    Intake/Output Summary (Last 24 hours) at 08/07/14 0941 Last data filed at 08/07/14 0500  Gross per 24 hour  Intake    540 ml  Output   2550 ml  Net  -2010 ml   Filed Weights   08/05/14 0546 08/06/14 0643 08/07/14 0543  Weight: 188 lb (85.276 kg) 187 lb 8 oz (85.049 kg) 174 lb 3.2 oz (79.017 kg)    PHYSICAL EXAM  General: Pleasant, NAD. Neuro: Alert and oriented X 3. Moves all extremities spontaneously. Psych: Normal affect. HEENT:  Normal  Neck: Supple without bruits or JVD. Lungs:  Resp regular and unlabored. Decreased breath sound in bibasilar area, no obvious rale.  Heart: RRR no s3, s4, or murmurs. R radial and R femoral cath sites stable, no bleeding. Good distal pulse Abdomen: Soft, non-tender, non-distended, BS + x 4.  Extremities: No clubbing, cyanosis. DP/PT/Radials 2+ and equal bilaterally. 1-2+ pitting edema.   Accessory Clinical Findings  CBC  Recent Labs  08/04/14 1626 08/06/14 1511  WBC   --  7.5  HGB 15.0 13.6  HCT 44.0 42.6  MCV  --  79.9  PLT  --  261   Basic Metabolic Panel  Recent Labs  08/06/14 0438 08/06/14 1511 08/07/14 0343  NA 141  --  142  K 3.3*  --  4.0  CL 107  --  109  CO2 25  --  25  GLUCOSE 87  --  96  BUN 6  --  8  CREATININE 1.12* 1.13* 1.06*  CALCIUM 7.8*  --  7.9*   Cardiac Enzymes  Recent Labs  08/05/14 0011 08/05/14 0501 08/05/14 1116  TROPONINI 0.07* 0.05* 0.03   BNP Invalid input(s): POCBNP D-Dimer  Recent Labs  08/04/14 1616  DDIMER 3.50*   Thyroid Function Tests  Recent Labs  08/05/14 0011  TSH 1.907    TELE NSR with 5 beats of NSVT    ECG  No new EKG  Echocardiogram 08/05/2014  - Left ventricle: The cavity size was moderately dilated. Wall thickness was normal. Systolic function was severely reduced. The estimated ejection fraction was in the range of 25% to 30%. Diffuse hypokinesis. - Mitral valve: There was moderate regurgitation. - Left atrium: The atrium was mildly dilated. - Atrial  septum: No defect or patent foramen ovale was identified. - Pericardium, extracardiac: Small pericardial effusion no tamponade     Radiology/Studies  Dg Chest 2 View  08/03/2014   CLINICAL DATA:  Chest pain, shortness of breath, nonproductive cough  EXAM: CHEST  2 VIEW  COMPARISON:  07/16/2014  FINDINGS: Lungs are clear.  No pleural effusion or pneumothorax.  Cardiomegaly.  Visualized osseous structures are within normal limits.  IMPRESSION: Cardiomegaly.   Electronically Signed   By: Charline Bills M.D.   On: 08/03/2014 21:50   Dg Chest 2 View  07/16/2014   CLINICAL DATA:  Chest pain and difficulty breathing  EXAM: CHEST  2 VIEW  COMPARISON:  None.  FINDINGS: Cardiac shadow is enlarged. The lungs are clear bilaterally. No bony abnormality is noted.  IMPRESSION: Cardiomegaly without acute pulmonary abnormality.   Electronically Signed   By: Alcide Clever M.D.   On: 07/16/2014 16:28   Ct Angio Chest Pe  W/cm &/or Wo Cm  08/04/2014   CLINICAL DATA:  Shortness of breath for 4 days. Evaluate for pulmonary embolism. Initial encounter.  EXAM: CT ANGIOGRAPHY CHEST WITH CONTRAST  TECHNIQUE: Multidetector CT imaging of the chest was performed using the standard protocol during bolus administration of intravenous contrast. Multiplanar CT image reconstructions and MIPs were obtained to evaluate the vascular anatomy.  CONTRAST:  OMNIPAQUE IOHEXOL 350 MG/ML SOLN  COMPARISON:  Radiographs 08/03/2014.  Abdominal CT 08/03/2014.  FINDINGS: Mediastinum: The pulmonary arteries are well opacified with contrast. There is no evidence of acute pulmonary embolism. There is limited opacification of the aorta which demonstrates no definite abnormality.Cardiomegaly and a small pericardial effusion are again noted. There are no enlarged mediastinal, hilar or axillary lymph nodes. The thyroid gland, trachea and esophagus demonstrate no significant findings.  Lungs/Pleura: There is no pleural effusion.The lungs demonstrate mosaic attenuation without focal airspace disease, suspicious nodule or endobronchial lesion.  Upper abdomen: There is reflux of contrast into the IVC and hepatic veins. No other significant findings.  Musculoskeletal/Chest wall: No chest wall lesion or acute osseous findings.Mild generalized soft tissue edema.  Review of the MIP images confirms the above findings.  IMPRESSION: 1. No evidence of acute pulmonary embolism. 2. Cardiomegaly and small pericardial effusion are noted. There is some reflux of contrast into the IVC and hepatic veins, again suggesting right heart failure or insufficiency. The size of the pericardial effusion does not suggest tamponade. Consider echocardiography for further evaluation. 3. Mild generalized soft tissue edema.   Electronically Signed   By: Carey Bullocks M.D.   On: 08/04/2014 19:01   Ct Abdomen Pelvis W Contrast  08/03/2014   CLINICAL DATA:  Acute onset of lower extremity and  abdominal swelling. Lower abdominal pain. Initial encounter.  EXAM: CT ABDOMEN AND PELVIS WITH CONTRAST  TECHNIQUE: Multidetector CT imaging of the abdomen and pelvis was performed using the standard protocol following bolus administration of intravenous contrast.  CONTRAST:  OMNIPAQUE IOHEXOL 300 MG/ML  SOLN  COMPARISON:  None.  FINDINGS: The visualized lung bases are clear. A trace pericardial effusion is noted. The heart is mildly enlarged.  Mild soft tissue edema is noted about the abdominal and pelvic wall bilaterally, and at the umbilicus. There is nonspecific vague heterogeneity within the liver. Would correlate for any evidence of venous congestion (nutmeg liver). Heterogeneity of the spleen likely reflects the phase of contrast enhancement.  The initial scan was performed 60 seconds after administration of contrast. The arterial enhancement pattern raises concern for some degree  of underlying cardiac insufficiency or failure.  Mild gallbladder wall thickening is nonspecific in the presence of ascites. Trace ascites is noted about the spleen and along the paracolic gutters. The pancreas and adrenal glands are unremarkable in appearance.  A 1.5 cm cyst is noted at the anterior aspect of the left kidney. The kidneys are otherwise unremarkable. There is no evidence of hydronephrosis. No renal or ureteral stones are seen. No perinephric stranding is appreciated.  The small bowel is unremarkable in appearance. The stomach is within normal limits. No acute vascular abnormalities are seen.  The appendix is not definitely characterized; there is no evidence for appendicitis. The colon is unremarkable in appearance.  Vague nonspecific soft tissue stranding is noted about the bladder, and diffuse presacral edema is seen.  The bladder is mildly distended and grossly unremarkable. The uterus is within normal limits. The ovaries are relatively symmetric. No suspicious adnexal masses are seen. No inguinal  lymphadenopathy is seen.  No acute osseous abnormalities are identified.  IMPRESSION: 1. The visualized arterial enhancement pattern at 60 seconds delay raises concern for some degree of underlying cardiac insufficiency or failure. 2. Mild diffuse soft tissue edema about the abdominal and pelvic wall bilaterally, and at the umbilicus. 3. Mild cardiomegaly noted.  Trace pericardial effusion seen. 4. Nonspecific vague heterogeneity within the liver. Would correlate for any evidence of venous congestion (nutmeg liver). 5. Trace ascites noted about the spleen and along the paracolic gutters. Mild gallbladder wall thickening is nonspecific in the presence of ascites. 6. Vague nonspecific soft tissue edema about the gallbladder, and diffuse presacral edema noted. 7. Small left renal cyst noted.   Electronically Signed   By: Roanna Raider M.D.   On: 08/03/2014 22:03   US Abdomen Limited Ruq  07/16/2014   CLINICAL DATA:  Right upper quadrant pain  EXAM: US ABDOMEN LIMITED - RIGHT UPPER QUADRANT  COMPARISON:  None.  FINDINGS: Gallbladder:  Well distended with diffuse gallbladder wall thickening and gallbladder wall edema. No calculi are seen.  Common bile duct:  Diameter: 2.6 mm.  Liver:  Within normal limits.  IMPRESSION: Diffusely thickened gallbladder wall without evidence of cholelithiasis. These changes would be consistent with acalculous cholecystitis. Correlation with the physical exam is recommended.   Electronically Signed   By: Alcide Clever M.D.   On: 07/16/2014 16:41    ASSESSMENT AND PLAN  1. Acute systolic HF with newly diagnosed LV dysfunction on echo - ongoing sx with cough for 2 weeks. - Echo 08/05/2014 EF 25-30%, moderate MR - LHC 08/06/2014 EF 25%, 55% mid LCx lesion, otherwise no significant CAD. Unable to do RHC due to fully collapse vein, given 1L IVF back after cath.   - Continue coreg and ARB, ASA. Check lipid panel as outpatient, may need statin if  high  - will restart PO lasix 40mg  daily  - discussed Na/Fluid restriction and daily weight to help with HF, also can do leg elevation at night to help with LE edema. Will need BMET in 1 week, I will arrange followup with Dr. Eden Emms. Anticipate discharge either later today or tomorrow.  2. NICM with EF 25-30%  3. HTN  4. Moderate MR  Signed, Amedeo Plenty Pager: [phone]  Agree with note by Azalee Course PA-C  Good diuresis. Clinically improved. On approp meds. Can titrate coreg up tp 25 mg PO BID. Exam benign. Minimal periph edema. Dietary speaking with her about low salt diet. Prob OK for DC later today. We will arrange  close OP F/U.    Runell Gess, M.D., FACP, [facility], [facility] [address] [place], [place]  [address]  [phone] 08/07/2014 12:52 PM

## 2014-08-07 NOTE — Progress Notes (Signed)
[facility] PROGRESS NOTE   Pamela Brooks QGB:[id number] DOB: 1971-01-27 DOA: 08/04/2014 PCP: No primary care provider on file.  HPI/Subjective: 2-D echo done and showed ejection fraction of 25-30%. Feels much better.  Assessment/Plan: Principal Problem:   Acute CHF (congestive heart failure) Active Problems:   SOB (shortness of breath)   HTN (hypertension)   Asthma   CHF (congestive heart failure)   Congestive dilated cardiomyopathy   Non-ischemic cardiomyopathy   Acute CHF Presented with SOB, orthopnea, lower extremity edema and elevated BNP of 982. Cardiology consulted, 2-D echocardiogram showed ejection fraction of 25-30%. Started on Coreg 6.25 mg, Lasix 40 mg and losartan 50 mg. Cardiac catheterization to be done today.  Hypertension Patient said her blood pressure was doing okay after she lost about 30 pounds last year. Blood pressure is elevated, improved after starting heart failure medications.  Elevated troponin Patient has slightly elevated troponin of 0.5 and 0.6, likely secondary to acute CHF. Obtained 2-D echocardiogram she is there is any wall motion abnormalities.   Hypokalemia Likely secondary to diuresis, replete with oral supplements.  Code Status: Full Code Family Communication: Plan discussed with the patient. Disposition Plan: Remains inpatient Diet: Diet Heart Room service appropriate?: Yes; Fluid consistency:: Thin Diet - low sodium heart healthy  Consultants:  Cardiology   Procedures:  None   Antibiotics: None  Subjective: Filed Vitals:   08/07/14 1020  BP: 126/98  Pulse: 100  Temp:   Resp:     Intake/Output Summary (Last 24 hours) at 08/07/14 1101 Last data filed at 08/07/14 0500  Gross per 24 hour  Intake    480 ml  Output   2550 ml  Net  -2070 ml   Filed Weights   08/05/14 0546 08/06/14 0643 08/07/14 0543  Weight: 85.276 kg (188 lb) 85.049 kg (187 lb 8 oz) 79.017 kg (174 lb 3.2 oz)    Exam: General: Alert  and awake, oriented x3, not in any acute distress. HEENT: anicteric sclera, pupils reactive to light and accommodation, EOMI CVS: S1-S2 clear, no murmur rubs or gallops Chest: clear to auscultation bilaterally, no wheezing, rales or rhonchi Abdomen: soft nontender, nondistended, normal bowel sounds, no organomegaly Extremities: no cyanosis, clubbing or edema noted bilaterally Neuro: Cranial nerves II-XII intact, no focal neurological deficits  Data Reviewed: Basic Metabolic Panel:  Recent Labs Lab 08/03/14 1941 08/04/14 1626 08/05/14 0501 08/06/14 0438 08/06/14 1511 08/07/14 0343  NA 139 141 137 141  --  142  K 3.9 4.1 3.9 3.3*  --  4.0  CL 109 108 106 107  --  109  CO2 21*  --  23 25  --  25  GLUCOSE 82 96 103* 87  --  96  BUN 17 17 12 6   --  8  CREATININE 1.09* 1.00 1.11* 1.12* 1.13* 1.06*  CALCIUM 8.4*  --  8.1* 7.8*  --  7.9*   Liver Function Tests:  Recent Labs Lab 08/03/14 1941  AST 27  ALT 37  ALKPHOS 55  BILITOT 0.9  PROT 6.1*  ALBUMIN 3.2*    Recent Labs Lab 08/03/14 1941  LIPASE 16*   No results for input(s): AMMONIA in the last 168 hours. CBC:  Recent Labs Lab 08/03/14 1941 08/04/14 1626 08/06/14 1511  WBC 7.1  --  7.5  NEUTROABS 3.8  --   --   HGB 13.0 15.0 13.6  HCT 40.7 44.0 42.6  MCV 81.9  --  79.9  PLT 227  --  261  Cardiac Enzymes:  Recent Labs Lab 08/05/14 0011 08/05/14 0501 08/05/14 1116  TROPONINI 0.07* 0.05* 0.03   BNP (last 3 results)  Recent Labs  08/04/14 1616  BNP 982.2*    ProBNP (last 3 results) No results for input(s): PROBNP in the last 8760 hours.  CBG: No results for input(s): GLUCAP in the last 168 hours.  Micro No results found for this or any previous visit (from the past 240 hour(s)).   Studies: No results found.  Scheduled Meds: . aspirin EC  81 mg Oral Daily  . carvedilol  6.25 mg Oral BID WC  . furosemide  40 mg Oral Daily  . heparin  5,000 Units Subcutaneous 3 times per day  .  losartan  50 mg Oral Daily  . potassium chloride  40 mEq Oral Daily  . sodium chloride  3 mL Intravenous Q12H  . sodium chloride  3 mL Intravenous Q12H   Continuous Infusions:       Time spent: 35 minutes    [facility]  [facility] Pager [phone] If 7PM-7AM, please contact night-coverage at [email or web address], password [facility] 08/07/2014, 11:01 AM  LOS: 3 days

## 2014-08-07 NOTE — Progress Notes (Signed)
Nutrition Education Note  RD consulted for nutrition education regarding new onset CHF.  RD provided "Heart Failure Nutrition Therapy" handout from the Academy of [employer] and Dietetics. Reviewed patient's dietary recall. Provided examples on ways to decrease sodium intake in diet. Discouraged intake of processed foods and use of salt shaker. Encouraged fresh fruits and vegetables as well as whole grain sources of carbohydrates to maximize fiber intake. Discussed low sodium options in each food group and discussed high sodium foods in each food group to limit/avoid.   RD discussed why it is important for patient to adhere to diet recommendations, and emphasized the role of fluids, foods to avoid, and importance of weighing self daily. Teach back method used.  Expect good compliance.  Body mass index is 32.93 kg/(m^2). Pt meets criteria for Obesity based on current BMI.  Current diet order is Heart Healthy, patient is consuming approximately 50-100% of meals at this time. Labs and medications reviewed. No further nutrition interventions warranted at this time. RD contact information provided. If additional nutrition issues arise, please re-consult RD.   Ian Malkin RD, LDN Inpatient Clinical Dietitian Pager: [phone] After Hours Pager: [phone]

## 2014-08-07 NOTE — Progress Notes (Signed)
Talked to patient about follow up medical care, patient stated that she is going to make an apt with a PCP in [place] when she is discharged home. She has her scales to weight herself daily. She has prescription drug coverage for her medication with [facility]; pharmacy of choice is [facility] and states that she does not have any problem getting her medication. Abelino Derrick RN,BSN,MHA [phone]

## 2014-08-07 NOTE — Progress Notes (Signed)
At 1417 all  d/c instructions explained and given to pt.  Verbalized understanding.  D/c off floor via w/c to awaiting  Transport.  Amanda Pea, Charity fundraiser.

## 2014-08-07 NOTE — Progress Notes (Signed)
[facility] PROGRESS NOTE   Pamela Brooks EGB:[id number] DOB: 1970/06/07 DOA: 08/04/2014 PCP: No primary care provider on file.  HPI/Subjective: Feels much better today after initiation of Lasix. Still has some shortness of breath.  Assessment/Plan: Principal Problem:   Acute CHF (congestive heart failure) Active Problems:   SOB (shortness of breath)   HTN (hypertension)   Asthma   CHF (congestive heart failure)   Congestive dilated cardiomyopathy   Non-ischemic cardiomyopathy   Acute CHF Presented with SOB, orthopnea, lower extremity edema and elevated BNP of 982. Pending 2-D echocardiogram.  Patient was given 1 L of normal saline bolus in the ED for unknown reason to me??? Continue Lasix, Entresto started on admission, not currently on beta blockers. I will consult cardiology as new onset CHF.  Hypertension Has patient said her blood pressure was doing okay after she lost about 30 pounds last year. Blood pressure is elevated, improved after starting heart failure medications.  Elevated troponin Patient has slightly elevated troponin of 0.5 and 0.6, likely secondary to acute CHF. Obtained 2-D echocardiogram she is there is any wall motion abnormalities.    Code Status: Full Code Family Communication: Plan discussed with the patient. Disposition Plan: Remains inpatient Diet: Diet Heart Room service appropriate?: Yes; Fluid consistency:: Thin Diet - low sodium heart healthy  Consultants:  Cardiology   Procedures:  None   Antibiotics: None  Subjective: Filed Vitals:   08/07/14 1020  BP: 126/98  Pulse: 100  Temp:   Resp:     Intake/Output Summary (Last 24 hours) at 08/07/14 1100 Last data filed at 08/07/14 0500  Gross per 24 hour  Intake    480 ml  Output   2550 ml  Net  -2070 ml   Filed Weights   08/05/14 0546 08/06/14 0643 08/07/14 0543  Weight: 85.276 kg (188 lb) 85.049 kg (187 lb 8 oz) 79.017 kg (174 lb 3.2 oz)    Exam: General: Alert  and awake, oriented x3, not in any acute distress. HEENT: anicteric sclera, pupils reactive to light and accommodation, EOMI CVS: S1-S2 clear, no murmur rubs or gallops Chest: clear to auscultation bilaterally, no wheezing, rales or rhonchi Abdomen: soft nontender, nondistended, normal bowel sounds, no organomegaly Extremities: no cyanosis, clubbing or edema noted bilaterally Neuro: Cranial nerves II-XII intact, no focal neurological deficits  Data Reviewed: Basic Metabolic Panel:  Recent Labs Lab 08/03/14 1941 08/04/14 1626 08/05/14 0501 08/06/14 0438 08/06/14 1511 08/07/14 0343  NA 139 141 137 141  --  142  K 3.9 4.1 3.9 3.3*  --  4.0  CL 109 108 106 107  --  109  CO2 21*  --  23 25  --  25  GLUCOSE 82 96 103* 87  --  96  BUN 17 17 12 6   --  8  CREATININE 1.09* 1.00 1.11* 1.12* 1.13* 1.06*  CALCIUM 8.4*  --  8.1* 7.8*  --  7.9*   Liver Function Tests:  Recent Labs Lab 08/03/14 1941  AST 27  ALT 37  ALKPHOS 55  BILITOT 0.9  PROT 6.1*  ALBUMIN 3.2*    Recent Labs Lab 08/03/14 1941  LIPASE 16*   No results for input(s): AMMONIA in the last 168 hours. CBC:  Recent Labs Lab 08/03/14 1941 08/04/14 1626 08/06/14 1511  WBC 7.1  --  7.5  NEUTROABS 3.8  --   --   HGB 13.0 15.0 13.6  HCT 40.7 44.0 42.6  MCV 81.9  --  79.9  PLT  227  --  261   Cardiac Enzymes:  Recent Labs Lab 08/05/14 0011 08/05/14 0501 08/05/14 1116  TROPONINI 0.07* 0.05* 0.03   BNP (last 3 results)  Recent Labs  08/04/14 1616  BNP 982.2*    ProBNP (last 3 results) No results for input(s): PROBNP in the last 8760 hours.  CBG: No results for input(s): GLUCAP in the last 168 hours.  Micro No results found for this or any previous visit (from the past 240 hour(s)).   Studies: No results found.  Scheduled Meds: . aspirin EC  81 mg Oral Daily  . carvedilol  6.25 mg Oral BID WC  . furosemide  40 mg Oral Daily  . heparin  5,000 Units Subcutaneous 3 times per day  .  losartan  50 mg Oral Daily  . potassium chloride  40 mEq Oral Daily  . sodium chloride  3 mL Intravenous Q12H  . sodium chloride  3 mL Intravenous Q12H   Continuous Infusions:      Time spent: 35 minutes    [facility] A  [facility] Pager [phone] If 7PM-7AM, please contact night-coverage at [email or web address], password [facility] 08/07/2014, 11:00 AM  LOS: 3 days

## 2014-08-07 NOTE — Discharge Summary (Signed)
Physician Discharge Summary  KOA PALLA NWG:[id number] DOB: 26-Apr-1970 DOA: 08/04/2014  PCP: No primary care provider on file.  Admit date: 08/04/2014 Discharge date: 08/07/2014  Time spent: 40 minutes  Recommendations for Outpatient Follow-up:  1. Follow-up with primary care physician within one week. 2. Follow-up with cardiology on 08/20/2014. 3. Need lipid profile to be checked.  Discharge Diagnoses:  Principal Problem:   Acute CHF (congestive heart failure) Active Problems:   SOB (shortness of breath)   HTN (hypertension)   Asthma   CHF (congestive heart failure)   Congestive dilated cardiomyopathy   Non-ischemic cardiomyopathy   Discharge Condition: Stable  Diet recommendation: Healthy  Filed Weights   08/05/14 0546 08/06/14 0643 08/07/14 0543  Weight: 85.276 kg (188 lb) 85.049 kg (187 lb 8 oz) 79.017 kg (174 lb 3.2 oz)     History of present illness:  44 yo female h/o htn, asthma comes in with 5 days of worsening le swelling and sob. Denies any fevers, cough. No pain. No pnd or orthopnea but sob worse with exertion. No h/o chf. Has been off bp meds for several years due to weight loss. She has never had heart attack. No liver issues. Pt in new onset chf. Asked to admit for evaluation.  Hospital Course:   Acute systolic CHF Presented with SOB, orthopnea, lower extremity edema and elevated BNP of 982. Cardiology consulted, 2-D echocardiogram showed ejection fraction of 25-30%. Started on Coreg 6.25 mg, Lasix 40 mg and losartan 50 mg. Cardiac catheterization done and showed 55% lesion on the mid circumflex. Cath showed well diuresed based on LVEDP of 6-10 mmHg, IV Lasix discontinued and started on oral Lasix. Patient discharged on Coreg, losartan, Lasix and aspirin.  Hypertension Patient said her blood pressure was doing okay after she lost about 30 pounds last year. Blood pressure is elevated, improved after starting heart failure medications.  Elevated  troponin Patient has slightly elevated troponin of 0.5 and 0.6, likely secondary to acute CHF. Obtained 2-D echocardiogram she is there is any wall motion abnormalities.   Hypokalemia Likely secondary to diuresis, repleted with oral supplements.    Procedures:  Cardiac catheterization done by Dr. Herbie Baltimore on 08/06/2014 Conclusion    1. Angiographically only moderate CAD and a right dominant system. 2. Mid Cx lesion, 55% stenosed. 3. Severe nonischemic cardiomyopathy with EF of roughly 25% and global hypokinesis 4. WELL diuresis based on LVEDP of 6-10 mmHg. Unable to perform Right Heart Cath  NONISCHEMIC CARDIOMYOPATHY  Recommendations:  Standard post radial cath care.  Would likely stop IV diuresis and continued to treat cardio myopathy with afterload reduction and beta blocker.  Would consider right heart catheterization if she has recurrence of heart failure, but for now her veins appear to be fully collapsed     Consultations:  Cardiology  Discharge Exam: Filed Vitals:   08/07/14 1020  BP: 126/98  Pulse: 100  Temp:   Resp:   General: Alert and awake, oriented x3, not in any acute distress. HEENT: anicteric sclera, pupils reactive to light and accommodation, EOMI CVS: S1-S2 clear, no murmur rubs or gallops Chest: clear to auscultation bilaterally, no wheezing, rales or rhonchi Abdomen: soft nontender, nondistended, normal bowel sounds, no organomegaly Extremities: no cyanosis, trace pedal edema on discharge Neuro: Cranial nerves II-XII intact, no focal neurological deficits  Discharge Instructions   Discharge Instructions    Diet - low sodium heart healthy    Complete by:  As directed      Increase activity slowly  Complete by:  As directed           Current Discharge Medication List    START taking these medications   Details  aspirin EC 81 MG EC tablet Take 1 tablet (81 mg total) by mouth daily. Qty: 30 tablet, Refills: 0    carvedilol (COREG)  6.25 MG tablet Take 1 tablet (6.25 mg total) by mouth 2 (two) times daily with a meal. Qty: 60 tablet, Refills: 0    furosemide (LASIX) 40 MG tablet Take 1 tablet (40 mg total) by mouth daily. Qty: 30 tablet, Refills: 0    losartan (COZAAR) 50 MG tablet Take 1 tablet (50 mg total) by mouth daily. Qty: 30 tablet, Refills: 0      CONTINUE these medications which have NOT CHANGED   Details  ranitidine (ZANTAC) 150 MG tablet Take 1 tablet (150 mg total) by mouth 2 (two) times daily. Qty: 60 tablet, Refills: 0    sucralfate (CARAFATE) 1 GM/10ML suspension Take 10 mLs (1 g total) by mouth 4 (four) times daily -  with meals and at bedtime. Qty: 420 mL, Refills: 0    HYDROcodone-acetaminophen (NORCO/VICODIN) 5-325 MG per tablet Take 1-2 tablets by mouth every 6 (six) hours as needed. Qty: 10 tablet, Refills: 0       Allergies  Allergen Reactions  . Tramadol Swelling    Facial swelling   Follow-up Information    Follow up with [facility] On 08/17/2014.   Why:  Obtain BMET lab to monitor renal function any time when lab opens between 8:30am until 4:30pm   Contact information:   [address] [place] [address] [phone]      Follow up with Norma Fredrickson, NP On 08/20/2014.   Specialties:  Nurse Practitioner, Interventional Cardiology, Cardiology, Radiology   Contact information:   [address] [place] [address] [phone]        The results of significant diagnostics from this hospitalization (including imaging, microbiology, ancillary and laboratory) are listed below for reference.    Significant Diagnostic Studies: Dg Chest 2 View  08/03/2014   CLINICAL DATA:  Chest pain, shortness of breath, nonproductive cough  EXAM: CHEST  2 VIEW  COMPARISON:  07/16/2014  FINDINGS: Lungs are clear.  No pleural effusion or pneumothorax.  Cardiomegaly.  Visualized osseous structures are within normal limits.  IMPRESSION: Cardiomegaly.    Electronically Signed   By: Charline Bills M.D.   On: 08/03/2014 21:50   Dg Chest 2 View  07/16/2014   CLINICAL DATA:  Chest pain and difficulty breathing  EXAM: CHEST  2 VIEW  COMPARISON:  None.  FINDINGS: Cardiac shadow is enlarged. The lungs are clear bilaterally. No bony abnormality is noted.  IMPRESSION: Cardiomegaly without acute pulmonary abnormality.   Electronically Signed   By: Alcide Clever M.D.   On: 07/16/2014 16:28   Ct Angio Chest Pe W/cm &/or Wo Cm  08/04/2014   CLINICAL DATA:  Shortness of breath for 4 days. Evaluate for pulmonary embolism. Initial encounter.  EXAM: CT ANGIOGRAPHY CHEST WITH CONTRAST  TECHNIQUE: Multidetector CT imaging of the chest was performed using the standard protocol during bolus administration of intravenous contrast. Multiplanar CT image reconstructions and MIPs were obtained to evaluate the vascular anatomy.  CONTRAST:  OMNIPAQUE IOHEXOL 350 MG/ML SOLN  COMPARISON:  Radiographs 08/03/2014.  Abdominal CT 08/03/2014.  FINDINGS: Mediastinum: The pulmonary arteries are well opacified with contrast. There is no evidence of acute pulmonary  embolism. There is limited opacification of the aorta which demonstrates no definite abnormality.Cardiomegaly and a small pericardial effusion are again noted. There are no enlarged mediastinal, hilar or axillary lymph nodes. The thyroid gland, trachea and esophagus demonstrate no significant findings.  Lungs/Pleura: There is no pleural effusion.The lungs demonstrate mosaic attenuation without focal airspace disease, suspicious nodule or endobronchial lesion.  Upper abdomen: There is reflux of contrast into the IVC and hepatic veins. No other significant findings.  Musculoskeletal/Chest wall: No chest wall lesion or acute osseous findings.Mild generalized soft tissue edema.  Review of the MIP images confirms the above findings.  IMPRESSION: 1. No evidence of acute pulmonary embolism. 2. Cardiomegaly and small pericardial effusion  are noted. There is some reflux of contrast into the IVC and hepatic veins, again suggesting right heart failure or insufficiency. The size of the pericardial effusion does not suggest tamponade. Consider echocardiography for further evaluation. 3. Mild generalized soft tissue edema.   Electronically Signed   By: Carey Bullocks M.D.   On: 08/04/2014 19:01   Ct Abdomen Pelvis W Contrast  08/03/2014   CLINICAL DATA:  Acute onset of lower extremity and abdominal swelling. Lower abdominal pain. Initial encounter.  EXAM: CT ABDOMEN AND PELVIS WITH CONTRAST  TECHNIQUE: Multidetector CT imaging of the abdomen and pelvis was performed using the standard protocol following bolus administration of intravenous contrast.  CONTRAST:  OMNIPAQUE IOHEXOL 300 MG/ML  SOLN  COMPARISON:  None.  FINDINGS: The visualized lung bases are clear. A trace pericardial effusion is noted. The heart is mildly enlarged.  Mild soft tissue edema is noted about the abdominal and pelvic wall bilaterally, and at the umbilicus. There is nonspecific vague heterogeneity within the liver. Would correlate for any evidence of venous congestion (nutmeg liver). Heterogeneity of the spleen likely reflects the phase of contrast enhancement.  The initial scan was performed 60 seconds after administration of contrast. The arterial enhancement pattern raises concern for some degree of underlying cardiac insufficiency or failure.  Mild gallbladder wall thickening is nonspecific in the presence of ascites. Trace ascites is noted about the spleen and along the paracolic gutters. The pancreas and adrenal glands are unremarkable in appearance.  A 1.5 cm cyst is noted at the anterior aspect of the left kidney. The kidneys are otherwise unremarkable. There is no evidence of hydronephrosis. No renal or ureteral stones are seen. No perinephric stranding is appreciated.  The small bowel is unremarkable in appearance. The stomach is within normal limits. No acute  vascular abnormalities are seen.  The appendix is not definitely characterized; there is no evidence for appendicitis. The colon is unremarkable in appearance.  Vague nonspecific soft tissue stranding is noted about the bladder, and diffuse presacral edema is seen.  The bladder is mildly distended and grossly unremarkable. The uterus is within normal limits. The ovaries are relatively symmetric. No suspicious adnexal masses are seen. No inguinal lymphadenopathy is seen.  No acute osseous abnormalities are identified.  IMPRESSION: 1. The visualized arterial enhancement pattern at 60 seconds delay raises concern for some degree of underlying cardiac insufficiency or failure. 2. Mild diffuse soft tissue edema about the abdominal and pelvic wall bilaterally, and at the umbilicus. 3. Mild cardiomegaly noted.  Trace pericardial effusion seen. 4. Nonspecific vague heterogeneity within the liver. Would correlate for any evidence of venous congestion (nutmeg liver). 5. Trace ascites noted about the spleen and along the paracolic gutters. Mild gallbladder wall thickening is nonspecific in the presence of ascites. 6. Vague nonspecific  soft tissue edema about the gallbladder, and diffuse presacral edema noted. 7. Small left renal cyst noted.   Electronically Signed   By: Roanna Raider M.D.   On: 08/03/2014 22:03   US Abdomen Limited Ruq  07/16/2014   CLINICAL DATA:  Right upper quadrant pain  EXAM: US ABDOMEN LIMITED - RIGHT UPPER QUADRANT  COMPARISON:  None.  FINDINGS: Gallbladder:  Well distended with diffuse gallbladder wall thickening and gallbladder wall edema. No calculi are seen.  Common bile duct:  Diameter: 2.6 mm.  Liver:  Within normal limits.  IMPRESSION: Diffusely thickened gallbladder wall without evidence of cholelithiasis. These changes would be consistent with acalculous cholecystitis. Correlation with the physical exam is recommended.   Electronically Signed   By: Alcide Clever M.D.   On: 07/16/2014 16:41     Microbiology: No results found for this or any previous visit (from the past 240 hour(s)).   Labs: Basic Metabolic Panel:  Recent Labs Lab 08/03/14 1941 08/04/14 1626 08/05/14 0501 08/06/14 0438 08/06/14 1511 08/07/14 0343  NA 139 141 137 141  --  142  K 3.9 4.1 3.9 3.3*  --  4.0  CL 109 108 106 107  --  109  CO2 21*  --  23 25  --  25  GLUCOSE 82 96 103* 87  --  96  BUN 17 17 12 6   --  8  CREATININE 1.09* 1.00 1.11* 1.12* 1.13* 1.06*  CALCIUM 8.4*  --  8.1* 7.8*  --  7.9*   Liver Function Tests:  Recent Labs Lab 08/03/14 1941  AST 27  ALT 37  ALKPHOS 55  BILITOT 0.9  PROT 6.1*  ALBUMIN 3.2*    Recent Labs Lab 08/03/14 1941  LIPASE 16*   No results for input(s): AMMONIA in the last 168 hours. CBC:  Recent Labs Lab 08/03/14 1941 08/04/14 1626 08/06/14 1511  WBC 7.1  --  7.5  NEUTROABS 3.8  --   --   HGB 13.0 15.0 13.6  HCT 40.7 44.0 42.6  MCV 81.9  --  79.9  PLT 227  --  261   Cardiac Enzymes:  Recent Labs Lab 08/05/14 0011 08/05/14 0501 08/05/14 1116  TROPONINI 0.07* 0.05* 0.03   BNP: BNP (last 3 results)  Recent Labs  08/04/14 1616  BNP 982.2*    ProBNP (last 3 results) No results for input(s): PROBNP in the last 8760 hours.  CBG: No results for input(s): GLUCAP in the last 168 hours.     Signed:  Niketa Turner A  [facility] 08/07/2014, 10:58 AM

## 2014-08-17 ENCOUNTER — Other Ambulatory Visit (INDEPENDENT_AMBULATORY_CARE_PROVIDER_SITE_OTHER): Payer: 59 | Admitting: *Deleted

## 2014-08-17 ENCOUNTER — Other Ambulatory Visit: Payer: 59

## 2014-08-17 DIAGNOSIS — I5021 Acute systolic (congestive) heart failure: Secondary | ICD-10-CM

## 2014-08-17 LAB — BASIC METABOLIC PANEL
BUN: 23 mg/dL (ref 6–23)
CALCIUM: 9.7 mg/dL (ref 8.4–10.5)
CO2: 29 mEq/L (ref 19–32)
CREATININE: 1.3 mg/dL — AB (ref 0.40–1.20)
Chloride: 100 mEq/L (ref 96–112)
GFR: 56.66 mL/min — AB (ref >60.00)
Glucose, Bld: 112 mg/dL — ABNORMAL HIGH (ref 70–99)
Potassium: 4.1 mEq/L (ref 3.5–5.1)
Sodium: 134 mEq/L — ABNORMAL LOW (ref 135–145)

## 2014-08-17 NOTE — Addendum Note (Signed)
Addended by: Tonita Phoenix on: 08/17/2014 09:48 AM   Modules accepted: Orders

## 2014-08-20 ENCOUNTER — Ambulatory Visit (INDEPENDENT_AMBULATORY_CARE_PROVIDER_SITE_OTHER): Payer: 59 | Admitting: Nurse Practitioner

## 2014-08-20 ENCOUNTER — Encounter: Payer: Self-pay | Admitting: Nurse Practitioner

## 2014-08-20 VITALS — BP 96/64 | HR 72 | Ht 61.0 in | Wt 158.8 lb

## 2014-08-20 DIAGNOSIS — I10 Essential (primary) hypertension: Secondary | ICD-10-CM

## 2014-08-20 DIAGNOSIS — I5022 Chronic systolic (congestive) heart failure: Secondary | ICD-10-CM

## 2014-08-20 DIAGNOSIS — I259 Chronic ischemic heart disease, unspecified: Secondary | ICD-10-CM

## 2014-08-20 LAB — BASIC METABOLIC PANEL
BUN: 30 mg/dL — ABNORMAL HIGH (ref 6–23)
CO2: 27 mEq/L (ref 19–32)
Calcium: 9.8 mg/dL (ref 8.4–10.5)
Chloride: 100 mEq/L (ref 96–112)
Creatinine, Ser: 1.3 mg/dL — ABNORMAL HIGH (ref 0.40–1.20)
GFR: 56.66 mL/min — ABNORMAL LOW (ref >60.00)
Glucose, Bld: 108 mg/dL — ABNORMAL HIGH (ref 70–99)
Potassium: 4.2 mEq/L (ref 3.5–5.1)
Sodium: 133 mEq/L — ABNORMAL LOW (ref 135–145)

## 2014-08-20 MED ORDER — FUROSEMIDE 40 MG PO TABS: 40.0000 mg | ORAL_TABLET | Freq: Every day | ORAL | Status: DC

## 2014-08-20 MED ORDER — LOSARTAN POTASSIUM 50 MG PO TABS: 50.0000 mg | ORAL_TABLET | Freq: Every day | ORAL | Status: DC

## 2014-08-20 MED ORDER — CARVEDILOL 6.25 MG PO TABS: 6.2500 mg | ORAL_TABLET | Freq: Two times a day (BID) | ORAL | Status: DC

## 2014-08-20 NOTE — Patient Instructions (Addendum)
We will be checking the following labs today - BMET   Medication Instructions:    Continue with your current medicines.     Testing/Procedures To Be Arranged:  N/A  Follow-Up:   DR. Eden Emms in [place] in one month    Other Special Instructions:   Restrict your salt  Weigh daily  Use an extra dose of Lasix if you gain over 3 pounds overnight  Call the [facility] office at [phone] if you have any questions, problems or concerns.

## 2014-08-20 NOTE — Progress Notes (Signed)
CARDIOLOGY OFFICE NOTE  Date:  08/20/2014    Pamela Brooks Date of Birth: 08-Nov-1970 Medical Record #[id number]  PCP:  No primary care provider on file.  Cardiologist:  Eden Emms (NEW - seen in consult on 08/04/2014)    Chief Complaint  Patient presents with  . FU for systolic HF    Post hospital visit - seen for Dr. Eden Emms.     History of Present Illness: Pamela Brooks is a 44 y.o. female who presents today for a post hospital visit. Seen for Dr. Eden Emms. She has had long standing HTN - not well controlled.   Presented with CHF - off medicines. Was diuresed. Underwent cath and echo.   Comes back today. Here alone. She is doing well. Returning to work this week. Not short of breath. Weight is down. No more swelling. No chest pain. Restricting her salt. She is not lightheaded or dizzy.   She prefers to follow up in our [place] office.   Past Medical History  Diagnosis Date  . Asthma   . HTN (hypertension)   . NICM (nonischemic cardiomyopathy)     Past Surgical History  Procedure Laterality Date  . Cardiac catheterization N/A 08/06/2014    Procedure: Left Heart Cath and Coronary Angiography;  Surgeon: Marykay Lex, MD;  Location: [facility] INVASIVE CV LAB;  Service: Cardiovascular;  Laterality: N/A;     Medications: Current Outpatient Prescriptions  Medication Sig Dispense Refill  . aspirin EC 81 MG EC tablet Take 1 tablet (81 mg total) by mouth daily. 30 tablet 0  . carvedilol (COREG) 6.25 MG tablet Take 1 tablet (6.25 mg total) by mouth 2 (two) times daily with a meal. 60 tablet 0  . furosemide (LASIX) 40 MG tablet Take 1 tablet (40 mg total) by mouth daily. 30 tablet 0  . HYDROcodone-acetaminophen (NORCO/VICODIN) 5-325 MG per tablet Take 1-2 tablets by mouth every 6 (six) hours as needed. (Patient not taking: Reported on 08/04/2014) 10 tablet 0  . losartan (COZAAR) 50 MG tablet Take 1 tablet (50 mg total) by mouth daily. 30 tablet 0  . ranitidine (ZANTAC) 150 MG tablet Take  1 tablet (150 mg total) by mouth 2 (two) times daily. 60 tablet 0  . sucralfate (CARAFATE) 1 GM/10ML suspension Take 10 mLs (1 g total) by mouth 4 (four) times daily -  with meals and at bedtime. 420 mL 0   No current facility-administered medications for this visit.    Allergies: Allergies  Allergen Reactions  . Tramadol Swelling    Facial swelling    Social History: The patient  reports that she has never smoked. She does not have any smokeless tobacco history on file. She reports that she does not drink alcohol or use illicit drugs.   Family History: The patient's family history includes Hypertension in her father and mother; Stroke in her father; Throat cancer in her father.   Review of Systems: Please see the history of present illness.   Otherwise, the review of systems is positive for none.   All other systems are reviewed and negative.   Physical Exam: VS:  BP 96/64 mmHg  Pulse 72  Ht 5\' 1"  (1.549 m)  Wt 158 lb 12.8 oz (72.031 kg)  BMI 30.02 kg/m2 .  BMI Body mass index is 30.02 kg/(m^2).  Wt Readings from Last 3 Encounters:  08/20/14 158 lb 12.8 oz (72.031 kg)  08/07/14 174 lb 3.2 oz (79.017 kg)  08/03/14 165 lb (74.844 kg)  General: Pleasant. Well developed, well nourished and in no acute distress.  HEENT: Normal. Poor dentition Neck: Supple, no JVD, carotid bruits, or masses noted.  Cardiac: Regular rate and rhythm. No murmurs, rubs, or gallops. No edema.  Respiratory:  Lungs are clear to auscultation bilaterally with normal work of breathing.  GI: Soft and nontender.  MS: No deformity or atrophy. Gait and ROM intact. Skin: Warm and dry. Color is normal.  Neuro:  Strength and sensation are intact and no gross focal deficits noted.  Psych: Alert, appropriate and with normal affect.   LABORATORY DATA:  EKG:  EKG is not ordered today.  Lab Results  Component Value Date   WBC 7.5 08/06/2014   HGB 13.6 08/06/2014   HCT 42.6 08/06/2014   PLT 261  08/06/2014   GLUCOSE 112* 08/17/2014   ALT 37 08/03/2014   AST 27 08/03/2014   NA 134* 08/17/2014   K 4.1 08/17/2014   CL 100 08/17/2014   CREATININE 1.30* 08/17/2014   BUN 23 08/17/2014   CO2 29 08/17/2014   TSH 1.907 08/05/2014    BNP (last 3 results)  Recent Labs  08/04/14 1616  BNP 982.2*    ProBNP (last 3 results) No results for input(s): PROBNP in the last 8760 hours.   Other Studies Reviewed Today:  Echo Study Conclusions from 07/2014  - Left ventricle: The cavity size was moderately dilated. Wall thickness was normal. Systolic function was severely reduced. The estimated ejection fraction was in the range of 25% to 30%. Diffuse hypokinesis. - Mitral valve: There was moderate regurgitation. - Left atrium: The atrium was mildly dilated. - Atrial septum: No defect or patent foramen ovale was identified. - Pericardium, extracardiac: Small pericardial effusion no tamponade   Cardiac Cath Conclusion    1. Angiographically only moderate CAD and a right dominant system. 2. Mid Cx lesion, 55% stenosed. 3. Severe nonischemic cardiomyopathy with EF of roughly 25% and global hypokinesis 4. WELL diuresis based on LVEDP of 6-10 mmHg. Unable to perform Right Heart Cath  NONISCHEMIC CARDIOMYOPATHY  Recommendations:  Standard post radial cath care.  Would likely stop IV diuresis and continued to treat cardio myopathy with afterload reduction and beta blocker.  Would consider right heart catheterization if she has recurrence of heart failure, but for now her veins appear to be fully collapsed  Results forwarded to cardiology consult service with note forwarded to primary hospitalist.      Assessment/Plan: 1. HTN - BP has improved nicely.   2. Systolic HF - EF of 25 to 30%. Discussed need for salt restriction and daily weights. I do not think we have room yet to increase her medicines. Would favor titrating her up as BP tolerates. Repeat echo after  11/05/2014. Reminded about need for salt restriction and daily weights.   3. CAD - manage medically.   Current medicines are reviewed with the patient today.  The patient does not have concerns regarding medicines other than what has been noted above.  The following changes have been made:  See above.  Labs/ tests ordered today include:   No orders of the defined types were placed in this encounter.     Disposition:   FU with Dr. Eden Emms in the [facility] officer per patient request.    Patient is agreeable to this plan and will call if any problems develop in the interim.   Signed: Rosalio Macadamia, RN, ANP-C 08/20/2014 12:12 PM  [facility] [address]  [place], [place]  [address] Phone: [phone] Fax: [phone]

## 2014-08-21 ENCOUNTER — Other Ambulatory Visit: Payer: Self-pay

## 2014-08-21 MED ORDER — FUROSEMIDE 20 MG PO TABS: 20.0000 mg | ORAL_TABLET | Freq: Every day | ORAL | Status: DC

## 2014-08-23 ENCOUNTER — Encounter: Payer: Self-pay | Admitting: Family Medicine

## 2014-09-19 NOTE — Progress Notes (Signed)
Patient ID: Pamela Brooks, female   DOB: [birth year], 44 y.o.   MRN: [id number]     CARDIOLOGY OFFICE NOTE  Date:  09/20/2014    Pamela Brooks Date of Birth: 03-11-1970 Medical Record #[id number]  PCP:  Frazier Richards, PA-C  Cardiologist:  Eden Emms (NEW - seen in consult on 08/04/2014)    No chief complaint on file.   History of Present Illness: Pamela Brooks is a 44 y.o. female who presents today for a post hospital visit.  She has had long standing HTN - not well controlled.   Presented with CHF - off medicines. Was diuresed. Underwent cath and echo.   Comes back today. Here alone. She is doing well. Working at [employer].  Lives with mom/dad and 56 yo son helps her a bit . Not short of breath. Weight is down. No more swelling. No chest pain. Restricting her salt. She is not lightheaded or dizzy.   She prefers to follow up in our [place] office.   Past Medical History  Diagnosis Date  . Asthma   . HTN (hypertension)   . NICM (nonischemic cardiomyopathy)   . CHF (congestive heart failure)     Past Surgical History  Procedure Laterality Date  . Cardiac catheterization N/A 08/06/2014    Procedure: Left Heart Cath and Coronary Angiography;  Surgeon: Marykay Lex, MD;  Location: [facility] INVASIVE CV LAB;  Service: Cardiovascular;  Laterality: N/A;     Medications: Current Outpatient Prescriptions  Medication Sig Dispense Refill  . aspirin EC 81 MG EC tablet Take 1 tablet (81 mg total) by mouth daily. 30 tablet 0  . carvedilol (COREG) 6.25 MG tablet Take 1 tablet (6.25 mg total) by mouth 2 (two) times daily with a meal. 60 tablet 11  . furosemide (LASIX) 20 MG tablet     . losartan (COZAAR) 50 MG tablet Take 1 tablet (50 mg total) by mouth daily. 30 tablet 11  . ranitidine (ZANTAC) 150 MG tablet Take 1 tablet (150 mg total) by mouth 2 (two) times daily. 60 tablet 0   No current facility-administered medications for this visit.    Allergies: Allergies  Allergen Reactions    . Tramadol Swelling    Facial swelling    Social History: The patient  reports that she has never smoked. She has never used smokeless tobacco. She reports that she does not drink alcohol or use illicit drugs.   Family History: The patient's family history includes Cancer in her father; Hypertension in her father and mother; Stroke in her father; Throat cancer in her father.   Review of Systems: Please see the history of present illness.   Otherwise, the review of systems is positive for none.   All other systems are reviewed and negative.   Physical Exam: VS:  BP 104/68 mmHg  Pulse 94  Ht  (1.6 m)  Wt 72.576 kg (160 lb)  BMI 28.35 kg/m2  SpO2 98%  LMP 09/04/2014 .  BMI Body mass index is 28.35 kg/(m^2).  Wt Readings from Last 3 Encounters:  09/20/14 72.576 kg (160 lb)  08/20/14 72.031 kg (158 lb 12.8 oz)  08/07/14 79.017 kg (174 lb 3.2 oz)    General: Pleasant. Well developed, well nourished and in no acute distress.  HEENT: Normal. Poor dentition Neck: Supple, no JVD, carotid bruits, or masses noted.  Cardiac: Regular rate and rhythm. No murmurs, rubs, or gallops. No edema.  Respiratory:  Lungs are clear to auscultation bilaterally with  normal work of breathing.  GI: Soft and nontender.  MS: No deformity or atrophy. Gait and ROM intact. Skin: Warm and dry. Color is normal.  Neuro:  Strength and sensation are intact and no gross focal deficits noted.  Psych: Alert, appropriate and with normal affect.   LABORATORY DATA:  EKG:  EKG  08/05/14  ST rate 99 LVH inferolateral ST/T wave changes   Lab Results  Component Value Date   WBC 7.5 08/06/2014   HGB 13.6 08/06/2014   HCT 42.6 08/06/2014   PLT 261 08/06/2014   GLUCOSE 108* 08/20/2014   ALT 37 08/03/2014   AST 27 08/03/2014   NA 133* 08/20/2014   K 4.2 08/20/2014   CL 100 08/20/2014   CREATININE 1.30* 08/20/2014   BUN 30* 08/20/2014   CO2 27 08/20/2014   TSH 1.907 08/05/2014    BNP (last 3  results)  Recent Labs  08/04/14 1616  BNP 982.2*    ProBNP (last 3 results) No results for input(s): PROBNP in the last 8760 hours.   Other Studies Reviewed Today:  Echo Study Conclusions from 07/2014  - Left ventricle: The cavity size was moderately dilated. Wall thickness was normal. Systolic function was severely reduced. The estimated ejection fraction was in the range of 25% to 30%. Diffuse hypokinesis. - Mitral valve: There was moderate regurgitation. - Left atrium: The atrium was mildly dilated. - Atrial septum: No defect or patent foramen ovale was identified. - Pericardium, extracardiac: Small pericardial effusion no tamponade   Cardiac Cath Conclusion    1. Angiographically only moderate CAD and a right dominant system. 2. Mid Cx lesion, 55% stenosed. 3. Severe nonischemic cardiomyopathy with EF of roughly 25% and global hypokinesis 4. WELL diuresis based on LVEDP of 6-10 mmHg. Unable to perform Right Heart Cath  NONISCHEMIC CARDIOMYOPATHY  Recommendations:  Standard post radial cath care.  Would likely stop IV diuresis and continued to treat cardio myopathy with afterload reduction and beta blocker.  Would consider right heart catheterization if she has recurrence of heart failure, but for now her veins appear to be fully collapsed  Results forwarded to cardiology consult service with note forwarded to primary hospitalist.      Assessment/Plan: 1. HTN - BP has improved nicely.    2. Systolic HF - EF of 25 to 30%. Discussed need for salt restriction and daily weights. Increase beta blocker given resting tachycardia Coreg 12.5 bid  3. CAD - manage medically.  Moderate 55% mid circumflex  ASA   Current medicines are reviewed with the patient today.  The patient does not have concerns regarding medicines other than what has been noted above.  The following changes have been made:  See above.  Labs/ tests ordered today include:   BMET  BNP   Disposition:   FU with me  in the [place] officer  3  months with echo same day   Patient is agreeable to this plan and will call if any problems develop in the interim.    Charlton Haws

## 2014-09-20 ENCOUNTER — Ambulatory Visit (INDEPENDENT_AMBULATORY_CARE_PROVIDER_SITE_OTHER): Payer: 59 | Admitting: Cardiovascular Disease

## 2014-09-20 ENCOUNTER — Encounter: Payer: Self-pay | Admitting: Cardiovascular Disease

## 2014-09-20 VITALS — BP 104/68 | HR 94 | Ht 63.0 in | Wt 160.0 lb

## 2014-09-20 DIAGNOSIS — I429 Cardiomyopathy, unspecified: Secondary | ICD-10-CM | POA: Diagnosis not present

## 2014-09-20 DIAGNOSIS — I428 Other cardiomyopathies: Secondary | ICD-10-CM

## 2014-09-20 LAB — BASIC METABOLIC PANEL
BUN: 15 mg/dL (ref 7–25)
CHLORIDE: 105 meq/L (ref 98–110)
CO2: 19 mEq/L — ABNORMAL LOW (ref 20–31)
Calcium: 9.2 mg/dL (ref 8.6–10.2)
Creat: 1.13 mg/dL — ABNORMAL HIGH (ref 0.50–1.10)
GLUCOSE: 91 mg/dL (ref 65–99)
POTASSIUM: 4.5 meq/L (ref 3.5–5.3)
Sodium: 136 mEq/L (ref 135–146)

## 2014-09-20 MED ORDER — CARVEDILOL 12.5 MG PO TABS: 12.5000 mg | ORAL_TABLET | Freq: Two times a day (BID) | ORAL | Status: DC

## 2014-09-20 NOTE — Patient Instructions (Signed)
Your physician recommends that you schedule a follow-up appointment in: 3 months with Dr.Nishan   INCREASE Coreg to 12.5 mg twice a day   Lab work today:BMET,BNP    Your physician has requested that you have an echocardiogram on the same day BEFORE YOU SEE DR Eden Emms. Echocardiography is a painless test that uses sound waves to create images of your heart. It provides your doctor with information about the size and shape of your heart and how well your heart's chambers and valves are working. This procedure takes approximately one hour. There are no restrictions for this procedure.   Thank you for choosing [facility] !

## 2014-09-21 LAB — BRAIN NATRIURETIC PEPTIDE: BRAIN NATRIURETIC PEPTIDE: 24.7 pg/mL (ref 0.0–100.0)

## 2014-11-30 ENCOUNTER — Telehealth: Payer: Self-pay

## 2014-11-30 NOTE — Telephone Encounter (Signed)
Pt did have echo scheduled for visit with Dr Eden Emms for 12/03/14,I am unable to reach her,we rescheduled aopt to 10/17 with echo at 8:30 and MD apt at 9:20.I lm at pt's house.Unable to reach at work or [facility] number

## 2014-11-30 NOTE — Telephone Encounter (Signed)
Moved pt's apt to 10/17 with echo at 8:30 and f/u apt 9:20,mailed letter to her

## 2014-12-03 ENCOUNTER — Other Ambulatory Visit (HOSPITAL_COMMUNITY): Payer: 59

## 2014-12-03 ENCOUNTER — Ambulatory Visit: Payer: 59 | Admitting: Cardiovascular Disease

## 2014-12-09 NOTE — Progress Notes (Signed)
Patient ID: Pamela Brooks, female   DOB: Jul 26, 1970, 44 y.o.   MRN: [id number]     CARDIOLOGY OFFICE NOTE  Date:  12/10/2014    Pamela Brooks Date of Birth: 17-Oct-1970 Medical Record #[id number]  PCP:  Frazier Richards, PA-C  Cardiologist:  Eden Emms (NEW - seen in consult on 08/04/2014)    No chief complaint on file.   History of Present Illness: Pamela Brooks is a 44 y.o. female who presents today for a post hospital visit.  She has had long standing HTN - not well controlled.   Presented with CHF - off medicines. Was diuresed. Underwent cath and echo.   Comes back today. Here alone. She is doing well. Working at [employer].  Lives with mom/dad and 60 yo son helps her a bit . Not short of breath. Weight is down. No more swelling. No chest pain. Restricting her salt. She is not lightheaded or dizzy.   She prefers to follow up in our [place] office.  Compliant with meds  Functional class one   Past Medical History  Diagnosis Date  . Asthma   . HTN (hypertension)   . NICM (nonischemic cardiomyopathy) (HCC)   . CHF (congestive heart failure) [facility])     Past Surgical History  Procedure Laterality Date  . Cardiac catheterization N/A 08/06/2014    Procedure: Left Heart Cath and Coronary Angiography;  Surgeon: Marykay Lex, MD;  Location: [facility] INVASIVE CV LAB;  Service: Cardiovascular;  Laterality: N/A;     Medications: Current Outpatient Prescriptions  Medication Sig Dispense Refill  . aspirin EC 81 MG EC tablet Take 1 tablet (81 mg total) by mouth daily. 30 tablet 0  . carvedilol (COREG) 12.5 MG tablet Take 1 tablet (12.5 mg total) by mouth 2 (two) times daily with a meal. 60 tablet 6  . furosemide (LASIX) 20 MG tablet     . losartan (COZAAR) 50 MG tablet Take 1 tablet (50 mg total) by mouth daily. 30 tablet 11   No current facility-administered medications for this visit.    Allergies: Allergies  Allergen Reactions  . Tramadol Swelling    Facial swelling    Social  History: The patient  reports that she has never smoked. She has never used smokeless tobacco. She reports that she does not drink alcohol or use illicit drugs.   Family History: The patient's family history includes Cancer in her father; Hypertension in her father and mother; Stroke in her father; Throat cancer in her father.   Review of Systems: Please see the history of present illness.   Otherwise, the review of systems is positive for none.   All other systems are reviewed and negative.   Physical Exam: VS:  BP 110/68 mmHg  Pulse 78  Ht  (1.549 m)  Wt 73.573 kg (162 lb 3.2 oz)  BMI 30.66 kg/m2  SpO2 99% .  BMI Body mass index is 30.66 kg/(m^2).  Wt Readings from Last 3 Encounters:  12/10/14 73.573 kg (162 lb 3.2 oz)  09/20/14 72.576 kg (160 lb)  08/20/14 72.031 kg (158 lb 12.8 oz)    General: Pleasant. Well developed, well nourished and in no acute distress.  HEENT: Normal. Poor dentition Neck: Supple, no JVD, carotid bruits, or masses noted.  Cardiac: Regular rate and rhythm. No murmurs, rubs, or gallops. No edema.  Respiratory:  Lungs are clear to auscultation bilaterally with normal work of breathing.  GI: Soft and nontender.  MS: No deformity or  atrophy. Gait and ROM intact. Skin: Warm and dry. Color is normal.  Neuro:  Strength and sensation are intact and no gross focal deficits noted.  Psych: Alert, appropriate and with normal affect.   LABORATORY DATA:  EKG:  EKG  08/05/14  ST rate 99 LVH inferolateral ST/T wave changes   Lab Results  Component Value Date   WBC 7.5 08/06/2014   HGB 13.6 08/06/2014   HCT 42.6 08/06/2014   PLT 261 08/06/2014   GLUCOSE 91 09/20/2014   ALT 37 08/03/2014   AST 27 08/03/2014   NA 136 09/20/2014   K 4.5 09/20/2014   CL 105 09/20/2014   CREATININE 1.13* 09/20/2014   BUN 15 09/20/2014   CO2 19* 09/20/2014   TSH 1.907 08/05/2014    BNP (last 3 results)  Recent Labs  08/04/14 1616  BNP 982.2*    ProBNP (last 3  results) No results for input(s): PROBNP in the last 8760 hours.   Other Studies Reviewed Today:  Echo Study Conclusions from 07/2014  - Left ventricle: The cavity size was moderately dilated. Wall thickness was normal. Systolic function was severely reduced. The estimated ejection fraction was in the range of 25% to 30%. Diffuse hypokinesis. - Mitral valve: There was moderate regurgitation. - Left atrium: The atrium was mildly dilated. - Atrial septum: No defect or patent foramen ovale was identified. - Pericardium, extracardiac: Small pericardial effusion no tamponade   Cardiac Cath Conclusion    1. Angiographically only moderate CAD and a right dominant system. 2. Mid Cx lesion, 55% stenosed. 3. Severe nonischemic cardiomyopathy with EF of roughly 25% and global hypokinesis 4. WELL diuresis based on LVEDP of 6-10 mmHg. Unable to perform Right Heart Cath  NONISCHEMIC CARDIOMYOPATHY  Recommendations:  Standard post radial cath care.  Would likely stop IV diuresis and continued to treat cardio myopathy with afterload reduction and beta blocker.  Would consider right heart catheterization if she has recurrence of heart failure, but for now her veins appear to be fully collapsed  Results forwarded to cardiology consult service with note forwarded to primary hospitalist.      Assessment/Plan: 1. HTN - BP has improved nicely.    2. Systolic HF - EF of 25 to 30% on initial echo Reviewed echo from today 12/10/14 and diffuse hypokinesis EF 30%  . Discussed need for salt restriction and daily weights. Increase beta blocker given resting tachycardia Coreg 12.5 bid  Will refer to CHF clinic.  F/U MRI for EF in 6 months.  QRS narrow would not seem to be a candidate for CRT unless we chose to test dysychrony using another method  3. CAD - manage medically.  Moderate 55% mid circumflex  ASA   Current medicines are reviewed with the patient today.  The patient does not have  concerns regarding medicines other than what has been noted above.  The following changes have been made:  See above.  Labs/ tests ordered today include:   BMET BNP   Disposition:   FU with me  in the [place] 6 months F/U CHF next available   Patient is agreeable to this plan and will call if any problems develop in the interim.    Charlton Haws

## 2014-12-10 ENCOUNTER — Encounter: Payer: Self-pay | Admitting: Cardiovascular Disease

## 2014-12-10 ENCOUNTER — Ambulatory Visit (INDEPENDENT_AMBULATORY_CARE_PROVIDER_SITE_OTHER): Payer: 59 | Admitting: Cardiovascular Disease

## 2014-12-10 ENCOUNTER — Ambulatory Visit (HOSPITAL_COMMUNITY)
Admission: RE | Admit: 2014-12-10 | Discharge: 2014-12-10 | Disposition: A | Discharge status: Home / Self Care | Payer: 59 | Source: Ambulatory Visit | Attending: Cardiovascular Disease | Admitting: Cardiovascular Disease

## 2014-12-10 VITALS — BP 110/68 | HR 78 | Ht 61.0 in | Wt 162.2 lb

## 2014-12-10 DIAGNOSIS — I5022 Chronic systolic (congestive) heart failure: Secondary | ICD-10-CM | POA: Diagnosis not present

## 2014-12-10 DIAGNOSIS — I428 Other cardiomyopathies: Secondary | ICD-10-CM

## 2014-12-10 DIAGNOSIS — I429 Cardiomyopathy, unspecified: Secondary | ICD-10-CM | POA: Diagnosis not present

## 2014-12-10 NOTE — Addendum Note (Signed)
Addended by: Marlyn Corporal A on: 12/10/2014 10:11 AM   Modules accepted: Orders

## 2014-12-10 NOTE — Patient Instructions (Addendum)
Your physician wants you to follow-up in: 6 months with Dr Haywood Filler will receive a reminder letter in the mail two months in advance. If you don't receive a letter, please call our office to schedule the follow-up appointment.     We will make you a next available apt with the Heart Failure Clinic in [facility]     Your physician recommends that you continue on your current medications as directed. Please refer to the Current Medication list given to you today.       Thank you for choosing [facility] !

## 2014-12-24 ENCOUNTER — Ambulatory Visit (HOSPITAL_COMMUNITY)
Admission: RE | Admit: 2014-12-24 | Discharge: 2014-12-24 | Disposition: A | Discharge status: Home / Self Care | Payer: 59 | Source: Ambulatory Visit | Attending: Cardiology | Admitting: Cardiology

## 2014-12-24 VITALS — BP 94/54 | HR 73 | Wt 166.0 lb

## 2014-12-24 DIAGNOSIS — I5022 Chronic systolic (congestive) heart failure: Secondary | ICD-10-CM | POA: Diagnosis not present

## 2014-12-24 DIAGNOSIS — I110 Hypertensive heart disease with heart failure: Secondary | ICD-10-CM | POA: Diagnosis not present

## 2014-12-24 DIAGNOSIS — Z8249 Family history of ischemic heart disease and other diseases of the circulatory system: Secondary | ICD-10-CM | POA: Insufficient documentation

## 2014-12-24 DIAGNOSIS — I420 Dilated cardiomyopathy: Secondary | ICD-10-CM

## 2014-12-24 DIAGNOSIS — I428 Other cardiomyopathies: Secondary | ICD-10-CM | POA: Insufficient documentation

## 2014-12-24 DIAGNOSIS — Z79899 Other long term (current) drug therapy: Secondary | ICD-10-CM | POA: Insufficient documentation

## 2014-12-24 DIAGNOSIS — I2510 Atherosclerotic heart disease of native coronary artery without angina pectoris: Secondary | ICD-10-CM | POA: Diagnosis not present

## 2014-12-24 DIAGNOSIS — Z7982 Long term (current) use of aspirin: Secondary | ICD-10-CM | POA: Insufficient documentation

## 2014-12-24 LAB — BASIC METABOLIC PANEL
Anion gap: 9 (ref 5–15)
BUN: 13 mg/dL (ref 6–20)
CO2: 23 mmol/L (ref 22–32)
CREATININE: 1.08 mg/dL — AB (ref 0.44–1.00)
Calcium: 9.4 mg/dL (ref 8.9–10.3)
Chloride: 108 mmol/L (ref 101–111)
GFR calc Af Amer: >60 mL/min (ref >60)
GFR calc non Af Amer: >60 mL/min (ref >60)
Glucose, Bld: 111 mg/dL — ABNORMAL HIGH (ref 65–99)
Potassium: 3.5 mmol/L (ref 3.5–5.1)
Sodium: 140 mmol/L (ref 135–145)

## 2014-12-24 MED ORDER — ATORVASTATIN CALCIUM 20 MG PO TABS: 20.0000 mg | ORAL_TABLET | Freq: Every day | ORAL | Status: DC

## 2014-12-24 MED ORDER — SPIRONOLACTONE 25 MG PO TABS
12.5000 mg | ORAL_TABLET | Freq: Every day | ORAL | Status: DC
Start: 2014-12-24 — End: 2015-03-20

## 2014-12-24 NOTE — Patient Instructions (Signed)
START Spironocaltone 12.5 mg, one tab daily at bedtime START Atorvastatin 20 mg, one tab daily  Labs today and again in 2 weeks (BMET)   Your physician has requested that you have a cardiac MRI. Cardiac MRI uses a computer to create images of your heart as its beating, producing both still and moving pictures of your heart and major blood vessels. For further information please visit [email or web address]. Please follow the instruction sheet given to you today for more information. DUE IN December 2016  Your physician recommends that you schedule a follow-up appointment in: 1 month  Do the following things EVERYDAY: 1) Weigh yourself in the morning before breakfast. Write it down and keep it in a log. 2) Take your medicines as prescribed 3) Eat low salt foods-Limit salt (sodium) to 2000 mg per day.  4) Stay as active as you can everyday 5) Limit all fluids for the day to less than 2 liters 6)

## 2014-12-25 DIAGNOSIS — I2510 Atherosclerotic heart disease of native coronary artery without angina pectoris: Secondary | ICD-10-CM | POA: Insufficient documentation

## 2014-12-25 DIAGNOSIS — I5022 Chronic systolic (congestive) heart failure: Secondary | ICD-10-CM | POA: Insufficient documentation

## 2014-12-25 LAB — PROTEIN ELECTROPHORESIS, SERUM
A/G Ratio: 1 (ref 0.7–1.7)
ALBUMIN ELP: 3.6 g/dL (ref 2.9–4.4)
ALPHA-2-GLOBULIN: 0.6 g/dL (ref 0.4–1.0)
Alpha-1-Globulin: 0.2 g/dL (ref 0.0–0.4)
Beta Globulin: 1 g/dL (ref 0.7–1.3)
Gamma Globulin: 1.8 g/dL (ref 0.4–1.8)
Globulin, Total: 3.7 g/dL (ref 2.2–3.9)
TOTAL PROTEIN ELP: 7.3 g/dL (ref 6.0–8.5)

## 2014-12-25 LAB — HIV ANTIBODY (ROUTINE TESTING W REFLEX): HIV SCREEN 4TH GENERATION: NONREACTIVE

## 2014-12-25 NOTE — Progress Notes (Signed)
Patient ID: Pamela Brooks, female   DOB: [birth year], 44 y.o.   MRN: [id number] PCP: Frazier Richards Cardiology: Dr. Eden Emms HF Cardiology: Dr. Shirlee Latch  44 yo with history of HTN and chronic systolic CHF presents for CHF clinic evaluation.  She is followed by Dr Eden Emms.  She went to the ER in 6/16 with dyspnea and peripheral edema. No chest pain.  No prior viral-type illness.  She was found to be in CHF, echo with EF 25-30%.  LHC was done, showing nonobstructive stenosis in the circumflex.  She was diuresed and BP controlled.    Currently, she seems to be doing well.  She is back to working full time.  No exertional dyspnea.  No orthopnea/PND.  No chest pain.  No lightheadedness or syncope.  No tachypalpitations.   ECG: NSR with inferior and lateral TWIs, narrow QRS (6/16).   Labs (7/16): BNP 25, K 4.5, creatinine 1.13  PMH: 1. HTN 2. Chronic systolic CHF: Nonischemic cardiomyopathy, ?due to HTN.  LHC (6/16) with 50% mLCx stenosis.  Echo (6/16) with EF 25-30%, moderately dilated LV, moderate MR.  Echo (10/16) with EF 30%, moderate LV dilation.  3. CAD: LHC (6/16) with 50% mLCx stenosis.   SH: Lives with parents in [place].  Works in Science writer.  Non smoker, no ETOH.   FH: Father and mother with HTN.  No CHF or CAD that she knows of.    ROS: All systems reviewed and negative except as per HPI.   Current Outpatient Prescriptions  Medication Sig Dispense Refill  . aspirin EC 81 MG EC tablet Take 1 tablet (81 mg total) by mouth daily. 30 tablet 0  . carvedilol (COREG) 12.5 MG tablet Take 1 tablet (12.5 mg total) by mouth 2 (two) times daily with a meal. 60 tablet 6  . furosemide (LASIX) 20 MG tablet Take 20 mg by mouth daily.     Marland Kitchen losartan (COZAAR) 50 MG tablet Take 1 tablet (50 mg total) by mouth daily. 30 tablet 11  . atorvastatin (LIPITOR) 20 MG tablet Take 1 tablet (20 mg total) by mouth daily. 30 tablet 3  . spironolactone (ALDACTONE) 25 MG tablet Take 0.5 tablets (12.5 mg total) by  mouth daily. 45 tablet 3   No current facility-administered medications for this encounter.   General: NAD Neck: No JVD, no thyromegaly or thyroid nodule.  Lungs: Clear to auscultation bilaterally with normal respiratory effort. CV: Nondisplaced PMI.  Heart regular S1/S2, no S3, +S4, no murmur.  No peripheral edema.  No carotid bruit.  Normal pedal pulses.  Abdomen: Soft, nontender, no hepatosplenomegaly, no distention.  Skin: Intact without lesions or rashes.  Neurologic: Alert and oriented x 3.  Psych: Normal affect. Extremities: No clubbing or cyanosis.  HEENT: Normal.   Assessment/Plan: 1. Chronic systolic CHF: Nonischemic cardiomyopathy.  This could be due to poorly controlled HTN.  Possible viral myocarditis.  NYHA class II symptoms, seems to be doing well currently.  She is not volume overloaded on exam.  - Check SPEP, UPEP, and HIV today.  - Continue Coreg and losartan.  Eventually will consider transition from losartan to Entresto, but BP is a bit soft for this today.  - Add spironolactone 12.5 mg daily, take in the evening to limit BP drop while awake.  - Continue current Lasix.  - BMET today and repeat in 2 wks.  - I will arrange for cardiac MRI in 12/16 to assess EF for MRI and also to assess for infiltrative disease.  2. CAD: Moderate nonobstructive CAD.  She is on ASA 81.  I will add atorvastatin 20 mg daily with lipids/LFTs in 2 months.  3. HTN: BP is actually on the low side today.   Marca Ancona 12/25/2014

## 2015-01-31 ENCOUNTER — Encounter (HOSPITAL_COMMUNITY): Payer: Self-pay | Admitting: Internal Medicine

## 2015-01-31 ENCOUNTER — Ambulatory Visit (HOSPITAL_COMMUNITY)
Admission: RE | Admit: 2015-01-31 | Discharge: 2015-01-31 | Disposition: A | Discharge status: Home / Self Care | Payer: 59 | Source: Ambulatory Visit | Attending: Internal Medicine | Admitting: Internal Medicine

## 2015-01-31 VITALS — BP 110/68 | HR 68 | Wt 169.2 lb

## 2015-01-31 DIAGNOSIS — I2510 Atherosclerotic heart disease of native coronary artery without angina pectoris: Secondary | ICD-10-CM | POA: Diagnosis not present

## 2015-01-31 DIAGNOSIS — I428 Other cardiomyopathies: Secondary | ICD-10-CM

## 2015-01-31 DIAGNOSIS — I429 Cardiomyopathy, unspecified: Secondary | ICD-10-CM | POA: Diagnosis not present

## 2015-01-31 DIAGNOSIS — I5022 Chronic systolic (congestive) heart failure: Secondary | ICD-10-CM | POA: Diagnosis not present

## 2015-01-31 DIAGNOSIS — I110 Hypertensive heart disease with heart failure: Secondary | ICD-10-CM | POA: Insufficient documentation

## 2015-01-31 LAB — BASIC METABOLIC PANEL
ANION GAP: 6 (ref 5–15)
BUN: 12 mg/dL (ref 6–20)
CO2: 21 mmol/L — AB (ref 22–32)
Calcium: 8.5 mg/dL — ABNORMAL LOW (ref 8.9–10.3)
Chloride: 110 mmol/L (ref 101–111)
Creatinine, Ser: 1.01 mg/dL — ABNORMAL HIGH (ref 0.44–1.00)
GFR calc Af Amer: >60 mL/min (ref >60)
GFR calc non Af Amer: >60 mL/min (ref >60)
GLUCOSE: 95 mg/dL (ref 65–99)
POTASSIUM: 4.2 mmol/L (ref 3.5–5.1)
Sodium: 137 mmol/L (ref 135–145)

## 2015-01-31 LAB — BRAIN NATRIURETIC PEPTIDE: B Natriuretic Peptide: 16 pg/mL (ref 0.0–100.0)

## 2015-01-31 MED ORDER — SACUBITRIL-VALSARTAN 24-26 MG PO TABS: 1.0000 | ORAL_TABLET | Freq: Two times a day (BID) | ORAL | Status: DC

## 2015-01-31 NOTE — Patient Instructions (Signed)
Stop Losartan  Start Entresto 24/26 mg Twice daily   Labs today  Labs in 2 weeks  Your physician has requested that you have a cardiac MRI. Cardiac MRI uses a computer to create images of your heart as its beating, producing both still and moving pictures of your heart and major blood vessels. For further information please visit [email or web address]. Please follow the instruction sheet given to you today for more information.  Your physician recommends that you schedule a follow-up appointment in: 1 month

## 2015-01-31 NOTE — Progress Notes (Signed)
Patient ID: Pamela Brooks, female   DOB: [birth year], 44 y.o.   MRN: [id number] PCP: Frazier Richards Cardiology: Dr. Eden Emms HF Cardiology: Dr. Shirlee Latch  44 yo with history of HTN and chronic systolic CHF presents for CHF clinic evaluation.  She is followed by Dr Eden Emms.  She went to the ER in 6/16 with dyspnea and peripheral edema. No chest pain.  No prior viral-type illness.  She was found to be in CHF, echo with EF 25-30%.  LHC was done, showing nonobstructive stenosis in the circumflex.  She was diuresed and BP controlled.    Currently, she seems to be doing well.  She is back to working full time.  No exertional dyspnea.  No orthopnea/PND.  No chest pain.  No lightheadedness or syncope.  No tachypalpitations.  She can climb steps without problems. Weight is up 3 lbs.   ECG: NSR with inferior and lateral TWIs, narrow QRS (6/16).   Labs (7/16): BNP 25, K 4.5, creatinine 1.13 Labs (10/16): SPEP negative, HIV negative, K 3.5, creatinine 1.08  PMH: 1. HTN 2. Chronic systolic CHF: Nonischemic cardiomyopathy, ?due to HTN.  LHC (6/16) with 50% mLCx stenosis.  Echo (6/16) with EF 25-30%, moderately dilated LV, moderate MR.  Echo (10/16) with EF 30%, moderate LV dilation.  3. CAD: LHC (6/16) with 50% mLCx stenosis.   SH: Lives with parents in [place].  Works in Science writer.  Non smoker, no ETOH.   FH: Father and mother with HTN.  No CHF or CAD that she knows of.    ROS: All systems reviewed and negative except as per HPI.   Current Outpatient Prescriptions  Medication Sig Dispense Refill  . aspirin EC 81 MG EC tablet Take 1 tablet (81 mg total) by mouth daily. 30 tablet 0  . atorvastatin (LIPITOR) 20 MG tablet Take 1 tablet (20 mg total) by mouth daily. 30 tablet 3  . carvedilol (COREG) 12.5 MG tablet Take 1 tablet (12.5 mg total) by mouth 2 (two) times daily with a meal. 60 tablet 6  . furosemide (LASIX) 20 MG tablet Take 20 mg by mouth daily.     Marland Kitchen spironolactone (ALDACTONE) 25 MG tablet  Take 0.5 tablets (12.5 mg total) by mouth daily. 45 tablet 3  . sacubitril-valsartan (ENTRESTO) 24-26 MG Take 1 tablet by mouth 2 (two) times daily. 60 tablet 6   No current facility-administered medications for this encounter.   General: NAD Neck: No JVD, no thyromegaly or thyroid nodule.  Lungs: Clear to auscultation bilaterally with normal respiratory effort. CV: Nondisplaced PMI.  Heart regular S1/S2, no S3, +S4, no murmur.  No peripheral edema.  No carotid bruit.  Normal pedal pulses.  Abdomen: Soft, nontender, no hepatosplenomegaly, no distention.  Skin: Intact without lesions or rashes.  Neurologic: Alert and oriented x 3.  Psych: Normal affect. Extremities: No clubbing or cyanosis.  HEENT: Normal.   Assessment/Plan: 1. Chronic systolic CHF: Nonischemic cardiomyopathy.  This could be due to poorly controlled HTN.  Possible viral myocarditis.  NYHA class II symptoms, seems to be doing well currently.  She is not volume overloaded on exam.  - Continue Coreg and and spironolactone.  Will check BMET today. - Stop losartan and start Entresto 24/26 bid. BMET again in 2 wks.   - Continue current Lasix.  - I will arrange for cardiac MRI this month to assess EF for ICD and also to assess for infiltrative disease. She would not be a CRT candidate with narrow QRS.  2. CAD: Moderate nonobstructive CAD.  She is on ASA 81.  She is now on atorvastatin.  Check lipids in 1/17. 3. HTN: BP controlled.    Marca Ancona 01/31/2015

## 2015-02-01 ENCOUNTER — Encounter (HOSPITAL_COMMUNITY): Payer: 59

## 2015-02-06 ENCOUNTER — Telehealth (HOSPITAL_COMMUNITY): Payer: Self-pay | Admitting: Pharmacist

## 2015-02-06 NOTE — Telephone Encounter (Signed)
Entresto 24-26 mg PA approved by [facility] through 02/04/16. [facility] pharmacy notified and verified copay $0/mo.    Tyler Deis. Bonnye Fava, PharmD, BCPS, CPP Clinical Pharmacist Pager: [phone] Phone: [phone] 02/06/2015 10:11 AM

## 2015-02-28 ENCOUNTER — Telehealth (HOSPITAL_COMMUNITY): Payer: Self-pay | Admitting: *Deleted

## 2015-02-28 NOTE — Telephone Encounter (Signed)
Pt has been approved for CM[facility]. Appproval #[id number] Valid through 1/117-04/10/15  Staff message sent to [facility] to schedule pt.

## 2015-03-01 ENCOUNTER — Encounter: Payer: Self-pay | Admitting: Cardiology

## 2015-03-04 ENCOUNTER — Other Ambulatory Visit (HOSPITAL_COMMUNITY): Payer: Self-pay | Admitting: Cardiology

## 2015-03-04 ENCOUNTER — Encounter (HOSPITAL_COMMUNITY): Payer: 59

## 2015-03-18 ENCOUNTER — Observation Stay (HOSPITAL_COMMUNITY)
Admission: EM | Admit: 2015-03-18 | Discharge: 2015-03-20 | Disposition: A | Discharge status: Home / Self Care | Payer: 59 | Attending: Internal Medicine | Admitting: Internal Medicine

## 2015-03-18 ENCOUNTER — Encounter (HOSPITAL_COMMUNITY): Payer: Self-pay | Admitting: *Deleted

## 2015-03-18 ENCOUNTER — Ambulatory Visit (HOSPITAL_COMMUNITY): Admission: RE | Admit: 2015-03-18 | Payer: 59 | Source: Ambulatory Visit

## 2015-03-18 ENCOUNTER — Emergency Department (HOSPITAL_COMMUNITY): Payer: 59

## 2015-03-18 ENCOUNTER — Encounter (HOSPITAL_COMMUNITY): Payer: 59

## 2015-03-18 DIAGNOSIS — R55 Syncope and collapse: Principal | ICD-10-CM | POA: Diagnosis present

## 2015-03-18 DIAGNOSIS — I10 Essential (primary) hypertension: Secondary | ICD-10-CM | POA: Diagnosis present

## 2015-03-18 DIAGNOSIS — I110 Hypertensive heart disease with heart failure: Secondary | ICD-10-CM | POA: Diagnosis not present

## 2015-03-18 DIAGNOSIS — I5022 Chronic systolic (congestive) heart failure: Secondary | ICD-10-CM | POA: Diagnosis present

## 2015-03-18 DIAGNOSIS — Z79899 Other long term (current) drug therapy: Secondary | ICD-10-CM | POA: Insufficient documentation

## 2015-03-18 DIAGNOSIS — N179 Acute kidney failure, unspecified: Secondary | ICD-10-CM | POA: Diagnosis not present

## 2015-03-18 DIAGNOSIS — R079 Chest pain, unspecified: Secondary | ICD-10-CM | POA: Diagnosis present

## 2015-03-18 DIAGNOSIS — J45909 Unspecified asthma, uncomplicated: Secondary | ICD-10-CM | POA: Diagnosis not present

## 2015-03-18 DIAGNOSIS — I428 Other cardiomyopathies: Secondary | ICD-10-CM | POA: Diagnosis not present

## 2015-03-18 DIAGNOSIS — I2510 Atherosclerotic heart disease of native coronary artery without angina pectoris: Secondary | ICD-10-CM | POA: Diagnosis not present

## 2015-03-18 DIAGNOSIS — I429 Cardiomyopathy, unspecified: Secondary | ICD-10-CM

## 2015-03-18 DIAGNOSIS — Z7982 Long term (current) use of aspirin: Secondary | ICD-10-CM | POA: Diagnosis not present

## 2015-03-18 DIAGNOSIS — I959 Hypotension, unspecified: Secondary | ICD-10-CM

## 2015-03-18 HISTORY — DX: Gastro-esophageal reflux disease without esophagitis: K21.9

## 2015-03-18 LAB — URINE MICROSCOPIC-ADD ON

## 2015-03-18 LAB — CBC
HCT: 43.3 % (ref 36.0–46.0)
Hemoglobin: 14.6 g/dL (ref 12.0–15.0)
MCH: 29.4 pg (ref 26.0–34.0)
MCHC: 33.7 g/dL (ref 30.0–36.0)
MCV: 87.3 fL (ref 78.0–100.0)
Platelets: 217 10*3/uL (ref 150–400)
RBC: 4.96 MIL/uL (ref 3.87–5.11)
RDW: 12.5 % (ref 11.5–15.5)
WBC: 10.2 10*3/uL (ref 4.0–10.5)

## 2015-03-18 LAB — URINALYSIS, ROUTINE W REFLEX MICROSCOPIC
BILIRUBIN URINE: NEGATIVE
Glucose, UA: NEGATIVE mg/dL
Nitrite: NEGATIVE
PROTEIN: NEGATIVE mg/dL
Specific Gravity, Urine: 1.025 (ref 1.005–1.030)
pH: 5.5 (ref 5.0–8.0)

## 2015-03-18 LAB — BASIC METABOLIC PANEL
Anion gap: 10 (ref 5–15)
BUN: 18 mg/dL (ref 6–20)
CALCIUM: 9 mg/dL (ref 8.9–10.3)
CHLORIDE: 106 mmol/L (ref 101–111)
CO2: 23 mmol/L (ref 22–32)
CREATININE: 1.3 mg/dL — AB (ref 0.44–1.00)
GFR calc non Af Amer: 48 mL/min — ABNORMAL LOW (ref >60)
GFR, EST AFRICAN AMERICAN: 56 mL/min — AB (ref >60)
GLUCOSE: 118 mg/dL — AB (ref 65–99)
Potassium: 4 mmol/L (ref 3.5–5.1)
Sodium: 139 mmol/L (ref 135–145)

## 2015-03-18 LAB — PREGNANCY, URINE: PREG TEST UR: NEGATIVE

## 2015-03-18 LAB — TROPONIN I
Troponin I: <0.03 ng/mL (ref <0.031)
Troponin I: <0.03 ng/mL (ref <0.031)

## 2015-03-18 LAB — BRAIN NATRIURETIC PEPTIDE: B NATRIURETIC PEPTIDE 5: 18 pg/mL (ref 0.0–100.0)

## 2015-03-18 MED ORDER — SODIUM CHLORIDE 0.9 % IV BOLUS (SEPSIS)
250.0000 mL | Freq: Once | INTRAVENOUS | Status: AC
  Administered 2015-03-18: 250 mL via INTRAVENOUS

## 2015-03-18 MED ORDER — ONDANSETRON HCL 4 MG/2ML IJ SOLN: 4.0000 mg | Freq: Four times a day (QID) | INTRAMUSCULAR | Status: DC | PRN

## 2015-03-18 MED ORDER — CARVEDILOL 12.5 MG PO TABS
12.5000 mg | ORAL_TABLET | Freq: Two times a day (BID) | ORAL | Status: DC
  Administered 2015-03-18 – 2015-03-20 (×4): 12.5 mg via ORAL
  Filled 2015-03-18 (×4): qty 1

## 2015-03-18 MED ORDER — ASPIRIN EC 81 MG PO TBEC
81.0000 mg | DELAYED_RELEASE_TABLET | Freq: Every day | ORAL | Status: DC
  Administered 2015-03-18 – 2015-03-20 (×3): 81 mg via ORAL
  Filled 2015-03-18 (×3): qty 1

## 2015-03-18 MED ORDER — ACETAMINOPHEN 325 MG PO TABS: 650.0000 mg | ORAL_TABLET | ORAL | Status: DC | PRN

## 2015-03-18 MED ORDER — ATORVASTATIN CALCIUM 20 MG PO TABS
20.0000 mg | ORAL_TABLET | Freq: Every day | ORAL | Status: DC
Start: 2015-03-18 — End: 2015-03-20
  Administered 2015-03-18 – 2015-03-20 (×3): 20 mg via ORAL
  Filled 2015-03-18 (×5): qty 1

## 2015-03-18 NOTE — Progress Notes (Signed)
ATTENDING:  Patient was admitted to [facility] under Dr Maryfrances Bunnell, awaiting bed there. She has EF 25-30% presented with lightheadedness, needing AICD.  Houston Siren, MD FACP.

## 2015-03-18 NOTE — ED Notes (Signed)
Sister of patient  At bedside and states that Wyvonne  Has appt tomorrow for possible stent placement  At her cardio md

## 2015-03-18 NOTE — ED Notes (Signed)
Report given to [facility]. Awaiting EMS arrival.

## 2015-03-18 NOTE — H&P (Signed)
PCP:   Pamela Richards, PA-C   Chief Complaint:  Dizzy, n/v, almost passed out  HPI: 45 yo female h/o htn, NICM EF 30% diagnosed in June 2016 with repeat echo in oct 2016 EF 30%, chronic CHF, undergoing evaluation for AICD placement comes in with sudden onset while sitting down of dizziness, nausea and vomiting, diaphoresis and almost passed out.  Pt says symptoms started while sitting, she ran to the bathroom vomited went back to sit down she got diaphoretic and almost passed out.  Ems was called, by the time they arrived she was lying and symptoms had improved.  She denies any fevers, cough or swelling.  No weight gain.  No prior episodes of similar symptoms.  No other neurological symptoms.  She did say after she vomited she did have some brief sscp with no radiation that also improved with lying.  She says she is suppose to get an MRI in the am, and be scheduled for AICD placement in the near future.  Pt referred for admission for her presynopce, and chest pain.  Review of Systems:  Positive and negative as per HPI otherwise all other systems are negative  Past Medical History: Past Medical History  Diagnosis Date  . Asthma   . HTN (hypertension)   . NICM (nonischemic cardiomyopathy) (HCC)   . CHF (congestive heart failure) [facility])    Past Surgical History  Procedure Laterality Date  . Cardiac catheterization N/A 08/06/2014    Procedure: Left Heart Cath and Coronary Angiography;  Surgeon: Pamela Lex, MD;  Location: [facility] INVASIVE CV LAB;  Service: Cardiovascular;  Laterality: N/A;    Medications: Prior to Admission medications   Medication Sig Start Date End Date Taking? Authorizing Provider  aspirin EC 81 MG EC tablet Take 1 tablet (81 mg total) by mouth daily. 08/07/14  Yes Pamela Llano, MD  atorvastatin (LIPITOR) 20 MG tablet Take 1 tablet (20 mg total) by mouth daily. 12/24/14  Yes Pamela Morale, MD  carvedilol (COREG) 12.5 MG tablet Take 1 tablet (12.5 mg total) by mouth 2  (two) times daily with a meal. 09/20/14  Yes Pamela Stade, MD  furosemide (LASIX) 20 MG tablet Take 20 mg by mouth daily.  09/01/14  Yes Historical Provider, MD  sacubitril-valsartan (ENTRESTO) 24-26 MG Take 1 tablet by mouth 2 (two) times daily. 01/31/15  Yes Pamela Patty, MD  spironolactone (ALDACTONE) 25 MG tablet Take 0.5 tablets (12.5 mg total) by mouth daily. 12/24/14  Yes Pamela Morale, MD    Allergies:   Allergies  Allergen Reactions  . Tramadol Swelling    Facial swelling    Social History:  reports that she has never smoked. She has never used smokeless tobacco. She reports that she does not drink alcohol or use illicit drugs.  Family History: Family History  Problem Relation Age of Onset  . Hypertension Mother   . Hypertension Father   . Stroke Father   . Throat cancer Father   . Cancer Father     Physical Exam: Filed Vitals:   03/18/15 0400 03/18/15 0415 03/18/15 0430 03/18/15 0445  BP:   116/82   Pulse: 87 81 80 77  Temp:      TempSrc:      Resp: Height:      Weight:      SpO2: 100% 100% 100% 100%   General appearance: alert, cooperative and no distress euvolemic Head: Normocephalic, without obvious abnormality, atraumatic Eyes: negative  Nose: Nares normal. Septum midline. Mucosa normal. No drainage or sinus tenderness. Neck: no JVD and supple, symmetrical, trachea midline Lungs: clear to auscultation bilaterally Heart: regular rate and rhythm, S1, S2 normal, no murmur, click, rub or gallop Abdomen: soft, non-tender; bowel sounds normal; no masses,  no organomegaly Extremities: extremities normal, atraumatic, no cyanosis or edema Pulses: 2+ and symmetric Skin: Skin color, texture, turgor normal. No rashes or lesions Neurologic: Grossly normal    Labs on Admission:   Recent Labs  03/18/15 0235  NA 139  K 4.0  CL 106  CO2 23  GLUCOSE 118*  BUN 18  CREATININE 1.30*  CALCIUM 9.0    Recent Labs  03/18/15 0235  WBC 10.2   HGB 14.6  HCT 43.3  MCV 87.3  PLT 217    Recent Labs  03/18/15 0235  TROPONINI <0.03    Radiological Exams on Admion: Dg Chest 2 View  03/18/2015  CLINICAL DATA:  Chest pain and near syncope. EXAM: CHEST  2 VIEW COMPARISON:  CT chest 08/04/2014.  Chest 08/03/2014 FINDINGS: The heart size and mediastinal contours are within normal limits. Both lungs are clear. The visualized skeletal structures are unremarkable. IMPRESSION: No active cardiopulmonary disease. Electronically Signed   By: Pamela Brooks M.D.   On: 03/18/2015 03:31   ekg nsr no acute issues  Assessment/Plan  45 yo female NICM EF 30% with presyncope and chest pain  Principal Problem:   Near syncope-  With associated chest pain, will transfer to [place].  Pt is hypotensive which is improved with small bolus.  Will hold her lasix and ARB and cont her coreg and aspirin.  [facility] to be evaluated on whether she needs AICD sooner rather than later.  There is no rhythm strips available from EMS encounter.  Active Problems:   HTN (hypertension)- noted, now with soft bp, hold some meds   Non-ischemic cardiomyopathy (HCC)- as above   Chronic systolic CHF (congestive heart failure) (HCC)as above   Chest pain-  Romi with serial trop.  Further evaluation per cardiology team.  obs on tele.  Full code.    Pamela Brooks A 03/18/2015, 4:59 AM

## 2015-03-18 NOTE — ED Notes (Signed)
Awaiting Lipitor from pharmacy.

## 2015-03-18 NOTE — Progress Notes (Signed)
Patient arrived to [id number]. Alert, oriented, no apparent discomfort or distress. Patient denies pain or discomfort. Page sent to [facility] Admitting pager to notify attending of patient's arrival. VSS. Will continue to monitor. Asher Muir Antigone Crowell,RN

## 2015-03-18 NOTE — ED Notes (Signed)
Report given to Asher Muir, Charity fundraiser at [facility]. Pt being prepared for transport.

## 2015-03-18 NOTE — ED Provider Notes (Signed)
TIME SEEN: 2:30 AM  CHIEF COMPLAINT: Near syncope, chest pain, nausea vomiting, diaphoresis  HPI: Pt is a 45 y.o. female with history of hypertension, nonischemic cardiomyopathy, asthma who presents to the emergency department with a near syncopal event tonight. Reports that she was doing her son's hair when she began feeling very lightheaded, had sharp left-sided chest pain without radiation, nausea and vomiting and diaphoresis. No palpitations. No shortness of breath. States that she does feel more lightheaded with standing over the past several days. No recent vomiting other than with this episode tonight or diarrhea. No fever. No numbness, tingling or focal weakness. No headache.  No history of PE, DVT, exogenous estrogen use, fracture, surgery, trauma, hospitalization, prolonged travel. No lower extremity swelling or pain. No calf tenderness.  States she is feeling better currently. Patient is hypotensive in the emergency department. States that her blood pressure normally runs "low" but she cannot tell me what the number is. She is on blood pressure medication for her nonischemic cardiomyopathy that is thought secondary to uncontrolled hypertension. States that she was recently started on Entresto in the beginning of December. She is also on Lasix, Coreg and spironolactone. She denies any changes in these medications.    Cath 08/06/14: 1. Angiographically only moderate CAD and a right dominant system. 2. Mid Cx lesion, 55% stenosed. 3. Severe nonischemic cardiomyopathy with EF of roughly 25% and global hypokinesis 4. WELL diuresis based on LVEDP of 6-10 mmHg. Unable to perform Right Heart Cath  ROS: See HPI Constitutional: no fever  Eyes: no drainage  ENT: no runny nose   Cardiovascular:  no chest pain  Resp: no SOB  GI: no vomiting GU: no dysuria Integumentary: no rash  Allergy: no hives  Musculoskeletal: no leg swelling  Neurological: no slurred speech ROS otherwise negative  PAST  MEDICAL HISTORY/PAST SURGICAL HISTORY:  Past Medical History  Diagnosis Date  . Asthma   . HTN (hypertension)   . NICM (nonischemic cardiomyopathy) (HCC)   . CHF (congestive heart failure) (HCC)     MEDICATIONS:  Prior to Admission medications   Medication Sig Start Date End Date Taking? Authorizing Provider  aspirin EC 81 MG EC tablet Take 1 tablet (81 mg total) by mouth daily. 08/07/14  Yes Clydia Llano, MD  atorvastatin (LIPITOR) 20 MG tablet Take 1 tablet (20 mg total) by mouth daily. 12/24/14  Yes Laurey Morale, MD  carvedilol (COREG) 12.5 MG tablet Take 1 tablet (12.5 mg total) by mouth 2 (two) times daily with a meal. 09/20/14  Yes Wendall Stade, MD  furosemide (LASIX) 20 MG tablet Take 20 mg by mouth daily.  09/01/14  Yes Historical Provider, MD  sacubitril-valsartan (ENTRESTO) 24-26 MG Take 1 tablet by mouth 2 (two) times daily. 01/31/15  Yes Dolores Patty, MD  spironolactone (ALDACTONE) 25 MG tablet Take 0.5 tablets (12.5 mg total) by mouth daily. 12/24/14  Yes Laurey Morale, MD    ALLERGIES:  Allergies  Allergen Reactions  . Tramadol Swelling    Facial swelling    SOCIAL HISTORY:  Social History  Substance Use Topics  . Smoking status: Never Smoker   . Smokeless tobacco: Never Used  . Alcohol Use: No    FAMILY HISTORY: Family History  Problem Relation Age of Onset  . Hypertension Mother   . Hypertension Father   . Stroke Father   . Throat cancer Father   . Cancer Father     EXAM: BP 91/57 mmHg  Pulse 71  Temp(Src) 98.4 F (36.9 C) (Oral)  Resp 11  Ht  (1.549 m)  Wt 166 lb (75.297 kg)  BMI 31.38 kg/m2  SpO2 100%  LMP 02/23/2015 CONSTITUTIONAL: Alert and oriented and responds appropriately to questions. Well-appearing; well-nourished HEAD: Normocephalic EYES: Conjunctivae clear, PERRL ENT: normal nose; no rhinorrhea; slightly dry mucous membranes; pharynx without lesions noted NECK: Supple, no meningismus, no LAD  CARD: RRR; S1 and S2  appreciated; no murmurs, no clicks, no rubs, no gallops RESP: Normal chest excursion without splinting or tachypnea; breath sounds clear and equal bilaterally; no wheezes, no rhonchi, no rales, no hypoxia or respiratory distress, speaking full sentences ABD/GI: Normal bowel sounds; non-distended; soft, non-tender, no rebound, no guarding, no peritoneal signs BACK:  The back appears normal and is non-tender to palpation, there is no CVA tenderness EXT: Normal ROM in all joints; non-tender to palpation; no edema; normal capillary refill; no cyanosis, no calf tenderness or swelling    SKIN: Normal color for age and race; warm NEURO: Moves all extremities equally, sensation to light touch intact diffusely, cranial nerves II through XII intact PSYCH: The patient's mood and manner are appropriate. Grooming and personal hygiene are appropriate.  MEDICAL DECISION MAKING: Patient here with near syncopal event. She does have an EF of 30% and was scheduled to have a cardiac MRI tomorrow. Per cardiology's notes have recommended an AICD. She is at significant risk for arrhythmia. No history of arrhythmia with EMS. Patient currently asymptomatic except with standing. She does appear slightly dry on exam. Will hydrate gently to see if this improves her blood pressure. She is having some blood pressures with 80s systolic. No infectious symptoms or other signs of sepsis. She is afebrile and nontoxic. I feel she will need admission.  ED PROGRESS: Patient's labs are unremarkable. She has chronic kidney disease which is unchanged. Troponin negative. BNP 18. Again no sign of volume overload on exam. If anything she appears dry. Blood pressures are slowly improving with gentle IV hydration. Chest x-ray shows no edema, infiltrate, pneumothorax.   4:00 AM  D/w Dr. Onalee Hua with hospitalist service who agrees on admission. Patient's blood pressure is improving with very gentle IV hydration. She has received 350 mL of IV fluid  total.  We'll be very cautious with IV fluids given her ejection fraction of 30%. She is supposed to have a cardiac MRI tomorrow per her sister. There is also recent note from Dr. Shirlee Latch recommending possible AICD. Given this episode tonight and her hypotension I feel she needs to be admitted to the hospital. It is unclear what rhythm she was in with EMS but she is in normal sinus now on asymptomatic. Dr. Onalee Hua and I both feel patient will benefit from being transferred to [facility] where she can see her cardiologist who potentially have a cardiac MRI while in the hospital and discuss AICD placement. Patient is comfortable with this plan. Accepting physician at [facility] would be Dr. Maryfrances Bunnell.  6:00 AM  Pt has received a total of 500 mL of IV fluid. No sign of respiratory distress or hypoxia. Her blood pressures have markedly improved with IV hydration. Awaiting transfer to [facility]. Patient is stable.  Urine shows moderate hemoglobin, small leukocytes, few bacteria. She is not having urinary symptoms. Urine culture is pending. Pregnancy test negative.   Date: 03/18/2015 1:37 AM  Rate: 71  Rhythm: normal sinus rhythm  QRS Axis: normal  Intervals: normal  ST/T Wave abnormalities: normal  Conduction  Disutrbances: none  Narrative Interpretation: Nonspecific ST flattening, T-wave inversions in inferior and anterior lateral leads compared to prior EKG in June 2016 have resolved       .  EKG Interpretation  Date/Time:  Monday March 18 2015 04:01:59 EST Ventricular Rate:  79 PR Interval:  174 QRS Duration: 95 QT Interval:  405 QTC Calculation: 464 R Axis:   62 Text Interpretation:  Sinus rhythm Borderline T abnormalities, anterior leads No significant change since Since last tracing of earlier today Confirmed by WARD,  DO, KRISTEN [phone]) on 03/18/2015 4:05:18 AM        Layla Maw Ward, DO 03/18/15 3491

## 2015-03-18 NOTE — ED Notes (Signed)
Pt resting at this time.  Visitor at bedside.

## 2015-03-19 ENCOUNTER — Encounter (HOSPITAL_COMMUNITY): Payer: Self-pay | Admitting: General Practice

## 2015-03-19 ENCOUNTER — Observation Stay (HOSPITAL_COMMUNITY): Payer: 59

## 2015-03-19 DIAGNOSIS — R55 Syncope and collapse: Secondary | ICD-10-CM

## 2015-03-19 DIAGNOSIS — I5022 Chronic systolic (congestive) heart failure: Secondary | ICD-10-CM

## 2015-03-19 DIAGNOSIS — I10 Essential (primary) hypertension: Secondary | ICD-10-CM | POA: Diagnosis not present

## 2015-03-19 DIAGNOSIS — I429 Cardiomyopathy, unspecified: Secondary | ICD-10-CM | POA: Diagnosis not present

## 2015-03-19 DIAGNOSIS — R079 Chest pain, unspecified: Secondary | ICD-10-CM

## 2015-03-19 DIAGNOSIS — N179 Acute kidney failure, unspecified: Secondary | ICD-10-CM | POA: Diagnosis not present

## 2015-03-19 LAB — URINE CULTURE

## 2015-03-19 LAB — BASIC METABOLIC PANEL
Anion gap: 10 (ref 5–15)
BUN: 16 mg/dL (ref 6–20)
CALCIUM: 8.8 mg/dL — AB (ref 8.9–10.3)
CO2: 19 mmol/L — ABNORMAL LOW (ref 22–32)
CREATININE: 1.02 mg/dL — AB (ref 0.44–1.00)
Chloride: 110 mmol/L (ref 101–111)
GFR calc Af Amer: >60 mL/min (ref >60)
GFR calc non Af Amer: >60 mL/min (ref >60)
GLUCOSE: 95 mg/dL (ref 65–99)
Potassium: 4.2 mmol/L (ref 3.5–5.1)
Sodium: 139 mmol/L (ref 135–145)

## 2015-03-19 LAB — TROPONIN I

## 2015-03-19 MED ORDER — SPIRONOLACTONE 25 MG PO TABS
12.5000 mg | ORAL_TABLET | Freq: Every day | ORAL | Status: DC
  Administered 2015-03-19: 12.5 mg via ORAL
  Filled 2015-03-19: qty 1

## 2015-03-19 MED ORDER — SACUBITRIL-VALSARTAN 24-26 MG PO TABS
1.0000 | ORAL_TABLET | Freq: Two times a day (BID) | ORAL | Status: DC
  Administered 2015-03-19 – 2015-03-20 (×2): 1 via ORAL
  Filled 2015-03-19 (×3): qty 1

## 2015-03-19 MED ORDER — GADOBENATE DIMEGLUMINE 529 MG/ML IV SOLN
22.0000 mL | Freq: Once | INTRAVENOUS | Status: AC | PRN
  Administered 2015-03-19: 22 mL via INTRAVENOUS

## 2015-03-19 NOTE — Consult Note (Signed)
Advanced Heart Failure Team Consult Note  Referring Physician:  Primary Physician: Dr Durwin Nora  Primary Cardiologist:  Dr Eden Emms  Primary HF Cardiologist: Dr Shirlee Latch   Reason for Consultation: Syncope   HPI:   Pamela Brooks is a 45 year old with history of HTN, chronic systolic heart failure, moderate nonobstructive cad admitted with syncope. She has been followed closely in the HF clinic and was last seen by Dr Shirlee Latch on December 5th with plan for cMRI.   After review of her medications she was taking entresto + 50 mg losartan daily. Losartan was stopped and entresto was started December 8th however she continued to take losartan 50 mg daily.  Weight at home has been stable 163-164 pounds. Working full time.   Yesterday she was admitted with pre-syncope. Prior to admit she had N/V/diaphoresis/chest pain. EMS called and symptoms had improved. No rhythm strips associated with event. Hypotensive in the ED that improved with IV fluids. Lasix, spiro, and entresto held. Troponins cycled  0.03, 0.03, and 0.03. Creatinine was up to 1.3 with baseline 1.0. BNP was low. CXR was ok.    Today she denies SOB/CP.     Review of Systems: [y] = yes,  = no   General: Weight gain ; Weight loss ; Anorexia ; Fatigue [Y ]; Fever ; Chills ; Weakness [ Y]  Cardiac: Chest pain/pressure [ Y]; Resting SOB ; Exertional SOB ; Orthopnea ; Pedal Edema ; Palpitations ; Syncope ; Presyncope [Y ]; Paroxysmal nocturnal dyspnea[ ]   Pulmonary: Cough ; Wheezing[ ] ; Hemoptysis[ ] ; Sputum ; Snoring   GI: Vomiting[ Y]; Dysphagia[ ] ; Melena[ ] ; Hematochezia ; Heartburn[ ] ; Abdominal pain ; Constipation ; Diarrhea ; BRBPR   GU: Hematuria[ ] ; Dysuria ; Nocturia[ ]   Vascular: Pain in legs with walking ; Pain in feet with lying flat ; Non-healing sores ; Stroke ; TIA ; Slurred speech ;  Neuro: Headaches[ ] ; Vertigo[ ] ; Seizures[ ] ; Paresthesias[ ] ;Blurred vision [  ]; Diplopia ; Vision changes   Ortho/Skin: Arthritis ; Joint pain ; Muscle pain ; Joint swelling ; Back Pain ; Rash   Psych: Depression[ ] ; Anxiety[ ]   Heme: Bleeding problems ; Clotting disorders ; Anemia   Endocrine: Diabetes ; Thyroid dysfunction[ ]   Home Medications Prior to Admission medications   Medication Sig Start Date End Date Taking? Authorizing Provider  aspirin EC 81 MG EC tablet Take 1 tablet (81 mg total) by mouth daily. 08/07/14  Yes Clydia Llano, MD  atorvastatin (LIPITOR) 20 MG tablet Take 1 tablet (20 mg total) by mouth daily. 12/24/14  Yes Laurey Morale, MD  carvedilol (COREG) 12.5 MG tablet Take 1 tablet (12.5 mg total) by mouth 2 (two) times daily with a meal. 09/20/14  Yes Wendall Stade, MD  furosemide (LASIX) 20 MG tablet Take 20 mg by mouth daily.  09/01/14  Yes Historical Provider, MD  losartan (COZAAR) 50 MG tablet Take 50 mg by mouth daily.   Yes Historical Provider, MD  sacubitril-valsartan (ENTRESTO) 24-26 MG Take 1 tablet by mouth 2 (two) times daily. 01/31/15  Yes Dolores Patty, MD  spironolactone (ALDACTONE) 25 MG tablet Take 0.5 tablets (12.5 mg total) by mouth daily. Patient not taking: Reported on 03/18/2015 12/24/14   Laurey Morale, MD  Past Medical History: Past Medical History  Diagnosis Date  . Asthma   . HTN (hypertension)   . NICM (nonischemic cardiomyopathy) (HCC)   . CHF (congestive heart failure) (HCC)     Past Surgical History: Past Surgical History  Procedure Laterality Date  . Cardiac catheterization N/A 08/06/2014    Procedure: Left Heart Cath and Coronary Angiography;  Surgeon: Marykay Lex, MD;  Location: [facility] INVASIVE CV LAB;  Service: Cardiovascular;  Laterality: N/A;    Family History: Family History  Problem Relation Age of Onset  . Hypertension Mother   . Hypertension Father   . Stroke Father   . Throat cancer Father   . Cancer Father     Social History: Social History    Social History  . Marital Status: Single    Spouse Name: N/A  . Number of Children: N/A  . Years of Education: N/A   Social History Main Topics  . Smoking status: Never Smoker   . Smokeless tobacco: Never Used  . Alcohol Use: No  . Drug Use: No  . Sexual Activity: Not Currently    Birth Control/ Protection: None   Other Topics Concern  . None   Social History Narrative    Allergies:  Allergies  Allergen Reactions  . Tramadol Swelling    Facial swelling    Objective:    Vital Signs:   Temp:  [98 F (36.7 C)-99.1 F (37.3 C)] 98.1 F (36.7 C) (01/24 0844) Pulse Rate:  [67-85] 75 (01/24 0900) Resp:  [12-19] 16 (01/24 0900) BP: (97-119)/(58-77) 119/73 mmHg (01/24 0900) SpO2:  [97 %-100 %] 100 % (01/24 0900) Weight:  [165 lb 14.4 oz (75.252 kg)-167 lb 9.6 oz (76.023 kg)] 167 lb 9.6 oz (76.023 kg) (01/24 0544) Last BM Date: 03/18/15  Weight change: Filed Weights   03/18/15 0125 03/18/15 1702 03/19/15 0544  Weight: 166 lb (75.297 kg) 165 lb 14.4 oz (75.252 kg) 167 lb 9.6 oz (76.023 kg)    Intake/Output:   Intake/Output Summary (Last 24 hours) at 03/19/15 1126 Last data filed at 03/19/15 1110  Gross per 24 hour  Intake    300 ml  Output    750 ml  Net   -450 ml     Physical Exam: General:  Well appearing. No resp difficulty. In bed HEENT: normal Neck: supple. JVP 5-6  . Carotids 2+ bilat; no bruits. No lymphadenopathy or thryomegaly appreciated. Cor: PMI nondisplaced. Regular rate & rhythm. No rubs, gallops or murmurs. Lungs: clear Abdomen: soft, nontender, nondistended. No hepatosplenomegaly. No bruits or masses. Good bowel sounds. Extremities: no cyanosis, clubbing, rash, edema Neuro: alert & orientedx3, cranial nerves grossly intact. moves all 4 extremities w/o difficulty. Affect pleasant  Telemetry: NSR  Labs: Basic Metabolic Panel:  Recent Labs Lab 03/18/15 0235 03/19/15 0542  NA 139 139  K 4.0 4.2  CL 106 110  CO2 23 19*  GLUCOSE 118*  95  BUN 18 16  CREATININE 1.30* 1.02*  CALCIUM 9.0 8.8*    Liver Function Tests: No results for input(s): AST, ALT, ALKPHOS, BILITOT, PROT, ALBUMIN in the last 168 hours. No results for input(s): LIPASE, AMYLASE in the last 168 hours. No results for input(s): AMMONIA in the last 168 hours.  CBC:  Recent Labs Lab 03/18/15 0235  WBC 10.2  HGB 14.6  HCT 43.3  MCV 87.3  PLT 217    Cardiac Enzymes:  Recent Labs Lab 03/18/15 0235 03/18/15 1827 03/18/15 2337 03/19/15 0542  TROPONINI <0.03 <0.03 <0.03 <  0.03    BNP: BNP (last 3 results)  Recent Labs  08/04/14 1616 01/31/15 1046 03/18/15 0235  BNP 982.2* 16.0 18.0    ProBNP (last 3 results) No results for input(s): PROBNP in the last 8760 hours.   CBG: No results for input(s): GLUCAP in the last 168 hours.  Coagulation Studies: No results for input(s): LABPROT, INR in the last 72 hours.  Other results: EKG: { NSR 71 bpm)  Imaging: Dg Chest 2 View  03/18/2015  CLINICAL DATA:  Chest pain and near syncope. EXAM: CHEST  2 VIEW COMPARISON:  CT chest 08/04/2014.  Chest 08/03/2014 FINDINGS: The heart size and mediastinal contours are within normal limits. Both lungs are clear. The visualized skeletal structures are unremarkable. IMPRESSION: No active cardiopulmonary disease. Electronically Signed   By: Burman Nieves M.D.   On: 03/18/2015 03:31      Medications:     Current Medications: . aspirin EC  81 mg Oral Daily  . atorvastatin  20 mg Oral Daily  . carvedilol  12.5 mg Oral BID WC     Infusions:      Assessment:  Pamela Saban is a 45 year old with a history of nonischemic CMP admitted with syncope.  1. Presyncope: Likely due to lowered bp from entresto and losartan . 2. Chronic Systolic HF: NICM.  Echo (6/16) with EF 25-30%, moderately dilated LV, moderate MR. Echo (10/16) with EF 30%, moderate LV dilation. Set up CMRI. NYHA II.  Volume status stable.  - Continue carvedilol 12.5 mg twice a day.  -  Restart entresto 24-26 mg twice a day tonight. I have instructed her to stop losartan and remove from her home meds. - Keep off lasix for now. Can use as needed for 3 pound weight gain.   - Can restart spironolactone 12.5 daily.  3. CAD: LHC 07/2014- 50% mLCx stenosis. Continue aspirin, statin, and BB.   Length of Stay:   Tonye Becket NP-C  03/19/2015, 11:26 AM  Advanced Heart Failure Team Pager [phone] (M-F; 7a - 4p)  Please contact [facility] for night-coverage after hours (4p -7a ) and weekends on [email or web address]  Patient seen with NP, agree with the above note.  She had an episode that almost seems vagal, was seated and felt warm all over and lightheaded.  Stood up, vomited in bathroom, and passed out.  She was initially hypotensive in ER, BP now recovered.    Suspect she may have been a bit dehydrated from Lasix, and she was also taking BOTH losartan and Entresto.   - STOP losartan. - Can use Lasix only prn.  - Restart Entresto tonight and can restart spironolactone 12.5 daily.   No arrhythmias on telemetry.  Missed cardiac MRI yesterday as outpatient, will arrange for today.  If EF < 35%, should get ICD.  If needs ICD, requests to do this admission.  Discussed with EP NP, possibly can be added on tomorrow but definitely Thursday.   Marca Ancona 03/19/2015 2:03 PM

## 2015-03-19 NOTE — Progress Notes (Signed)
[facility] Progress Note   Pamela Brooks  WUJ:[id number]  DOB: 1970/05/11  DOA: 03/18/2015 PCP: Frazier Richards, PA-C  Brief narrative: Pamela Brooks is a 45 y.o. female htn, NICM EF 30% diagnosed in June 2016 with repeat echo in oct 2016 EF 30%, chronic CHF, undergoing evaluation for AICD placement comes in with sudden onset dizziness followed by vomiting x 3 episodes. She began to have chest pain after the vomiting started. No palpitations. She was due to have and cardiac MRI yesterday which she missed as she was in the hospital.    Subjective: No further dizziness or vomiting. No chest pain or palpitations. No dyspnea or cough.   Assessment/Plan: Principal Problem:   Near syncope/ chest pain - question if this was an arrhythmia vs vasovagal episode- follow on telemetry - cardiac enzymes negative - will ask for cards eval as she is being worked up for an AICD as outpt- will obtain Cardiac MRI here   Active Problems:   HTN (hypertension) -cont Coreg    Non-ischemic cardiomyopathy /  Chronic systolic CHF (congestive heart failure) - lasix on hold due to AKI  AKI - baseline Cr about 1.0- 1.30 yesterday - will recheck today- lasix on hold     Antibiotics: Anti-infectives    None     Code Status:     Code Status Orders        Start     Ordered   03/18/15 1712  Full code   Continuous     03/18/15 1711    Code Status History    Date Active Date Inactive Code Status Order ID Comments User Context   08/06/2014 12:55 PM 08/07/2014  5:18 PM Full Code [id number]  Marykay Lex, MD Inpatient   08/04/2014 11:16 PM 08/06/2014 12:55 PM Full Code [id number]  Haydee Monica, MD Inpatient     Family Communication:   Disposition Plan: home in 1-2 days DVT prophylaxis: Lovenox Consultants: cardiology Procedures:     Objective: Filed Weights   03/18/15 0125 03/18/15 1702 03/19/15 0544  Weight: 75.297 kg (166 lb) 75.252 kg (165 lb 14.4 oz) 76.023 kg (167 lb 9.6 oz)     Intake/Output Summary (Last 24 hours) at 03/19/15 1045 Last data filed at 03/19/15 0900  Gross per 24 hour  Intake    300 ml  Output    650 ml  Net   -350 ml     Vitals Filed Vitals:   03/19/15 0023 03/19/15 0544 03/19/15 0844 03/19/15 0900  BP: 105/58 108/69 115/64 119/73  Pulse: 74 73 67 75  Temp: 98.7 F (37.1 C) 98.5 F (36.9 C) 98.1 F (36.7 C)   TempSrc: Oral Oral Oral   Resp: Height:      Weight:  76.023 kg (167 lb 9.6 oz)    SpO2: 100% 99% 100% 100%    Exam:  General:  Pt is alert, not in acute distress  HEENT: No icterus, No thrush, oral mucosa moist  Cardiovascular: regular rate and rhythm, S1/S2 No murmur  Respiratory: clear to auscultation bilaterally   Abdomen: Soft, +Bowel sounds, non tender, non distended, no guarding  MSK: No cyanosis or clubbing- no pedal edema   Data Reviewed: Basic Metabolic Panel:  Recent Labs Lab 03/18/15 0235  NA 139  K 4.0  CL 106  CO2 23  GLUCOSE 118*  BUN 18  CREATININE 1.30*  CALCIUM 9.0   Liver Function Tests: No results for input(s): AST, ALT,  ALKPHOS, BILITOT, PROT, ALBUMIN in the last 168 hours. No results for input(s): LIPASE, AMYLASE in the last 168 hours. No results for input(s): AMMONIA in the last 168 hours. CBC:  Recent Labs Lab 03/18/15 0235  WBC 10.2  HGB 14.6  HCT 43.3  MCV 87.3  PLT 217   Cardiac Enzymes:  Recent Labs Lab 03/18/15 0235 03/18/15 1827 03/18/15 2337 03/19/15 0542  TROPONINI <0.03 <0.03 <0.03 <0.03   BNP (last 3 results)  Recent Labs  08/04/14 1616 01/31/15 1046 03/18/15 0235  BNP 982.2* 16.0 18.0    ProBNP (last 3 results) No results for input(s): PROBNP in the last 8760 hours.  CBG: No results for input(s): GLUCAP in the last 168 hours.  No results found for this or any previous visit (from the past 240 hour(s)).   Studies: Dg Chest 2 View  03/18/2015  CLINICAL DATA:  Chest pain and near syncope. EXAM: CHEST  2 VIEW COMPARISON:   CT chest 08/04/2014.  Chest 08/03/2014 FINDINGS: The heart size and mediastinal contours are within normal limits. Both lungs are clear. The visualized skeletal structures are unremarkable. IMPRESSION: No active cardiopulmonary disease. Electronically Signed   By: Burman Nieves M.D.   On: 03/18/2015 03:31    Scheduled Meds:  Scheduled Meds: . aspirin EC  81 mg Oral Daily  . atorvastatin  20 mg Oral Daily  . carvedilol  12.5 mg Oral BID WC   Continuous Infusions:   Time spent on care of this patient: 35 min   Sudiksha Victor, MD 03/19/2015, 10:45 AM    [facility] Office  [phone] Pager - Text Page per [email or web address] If 7PM-7AM, please contact night-coverage [email or web address]

## 2015-03-19 NOTE — Progress Notes (Deleted)
[facility] Progress Note   [name]  LKG:[id number]  DOB: 1971/02/08  DOA: 03/18/2015 PCP: Frazier Richards, PA-C  Brief narrative: Pamela Brooks is a 45 y.o. female htn, NICM EF 30% diagnosed in June 2016 with repeat echo in oct 2016 EF 30%, chronic CHF, undergoing evaluation for AICD placement comes in with sudden onset dizziness followed by vomiting x 3 episodes. She began to have chest pain after the vomiting started. No palpitations. She was due to have and cardiac MRI yesterday which she missed as she was in the hospital.    Subjective: No further dizziness or vomiting. No chest pain or palpitations. No dyspnea or cough.   Assessment/Plan: Principal Problem:   Near syncope/ chest pain - question if this was an arrhythmia vs vasovagal episode- follow on telemetry- will ask for cards eval as she is being worked up for an AICD- will obtain Cardiac MRI here   Active Problems:   HTN (hypertension) -cont Coreg    Non-ischemic cardiomyopathy /  Chronic systolic CHF (congestive heart failure) - lasix on hold due to AKI  AKI - baseline Cr about 1.0- 1.30 yesterday - will recheck today- lasix on hold     Antibiotics: Anti-infectives    None     Code Status:     Code Status Orders        Start     Ordered   03/18/15 1712  Full code   Continuous     03/18/15 1711    Code Status History    Date Active Date Inactive Code Status Order ID Comments User Context   08/06/2014 12:55 PM 08/07/2014  5:18 PM Full Code [id number]  Marykay Lex, MD Inpatient   08/04/2014 11:16 PM 08/06/2014 12:55 PM Full Code [id number]  Haydee Monica, MD Inpatient     Family Communication:   Disposition Plan: home in 1-2 days DVT prophylaxis: Lovenox Consultants: cardiology Procedures:     Objective: Filed Weights   03/18/15 0125 03/18/15 1702 03/19/15 0544  Weight: 75.297 kg (166 lb) 75.252 kg (165 lb 14.4 oz) 76.023 kg (167 lb 9.6 oz)    Intake/Output Summary (Last 24 hours)  at 03/19/15 1034 Last data filed at 03/19/15 0900  Gross per 24 hour  Intake    300 ml  Output    650 ml  Net   -350 ml     Vitals Filed Vitals:   03/19/15 0023 03/19/15 0544 03/19/15 0844 03/19/15 0900  BP: 105/58 108/69 115/64 119/73  Pulse: 74 73 67 75  Temp: 98.7 F (37.1 C) 98.5 F (36.9 C) 98.1 F (36.7 C)   TempSrc: Oral Oral Oral   Resp: Height:      Weight:  76.023 kg (167 lb 9.6 oz)    SpO2: 100% 99% 100% 100%    Exam:  General:  Pt is alert, not in acute distress  HEENT: No icterus, No thrush, oral mucosa moist  Cardiovascular: regular rate and rhythm, S1/S2 No murmur  Respiratory: clear to auscultation bilaterally   Abdomen: Soft, +Bowel sounds, non tender, non distended, no guarding  MSK: No cyanosis or clubbing- no pedal edema   Data Reviewed: Basic Metabolic Panel:  Recent Labs Lab 03/18/15 0235  NA 139  K 4.0  CL 106  CO2 23  GLUCOSE 118*  BUN 18  CREATININE 1.30*  CALCIUM 9.0   Liver Function Tests: No results for input(s): AST, ALT, ALKPHOS, BILITOT, PROT, ALBUMIN in the last  168 hours. No results for input(s): LIPASE, AMYLASE in the last 168 hours. No results for input(s): AMMONIA in the last 168 hours. CBC:  Recent Labs Lab 03/18/15 0235  WBC 10.2  HGB 14.6  HCT 43.3  MCV 87.3  PLT 217   Cardiac Enzymes:  Recent Labs Lab 03/18/15 0235 03/18/15 1827 03/18/15 2337 03/19/15 0542  TROPONINI <0.03 <0.03 <0.03 <0.03   BNP (last 3 results)  Recent Labs  08/04/14 1616 01/31/15 1046 03/18/15 0235  BNP 982.2* 16.0 18.0    ProBNP (last 3 results) No results for input(s): PROBNP in the last 8760 hours.  CBG: No results for input(s): GLUCAP in the last 168 hours.  No results found for this or any previous visit (from the past 240 hour(s)).   Studies: Dg Chest 2 View  03/18/2015  CLINICAL DATA:  Chest pain and near syncope. EXAM: CHEST  2 VIEW COMPARISON:  CT chest 08/04/2014.  Chest 08/03/2014  FINDINGS: The heart size and mediastinal contours are within normal limits. Both lungs are clear. The visualized skeletal structures are unremarkable. IMPRESSION: No active cardiopulmonary disease. Electronically Signed   By: Burman Nieves M.D.   On: 03/18/2015 03:31    Scheduled Meds:  Scheduled Meds: . aspirin EC  81 mg Oral Daily  . atorvastatin  20 mg Oral Daily  . carvedilol  12.5 mg Oral BID WC   Continuous Infusions:   Time spent on care of this patient: 35 min   Mellissa Conley, MD 03/19/2015, 10:34 AM    [facility] Office  [phone] Pager - Text Page per [email or web address] If 7PM-7AM, please contact night-coverage [email or web address]

## 2015-03-20 ENCOUNTER — Telehealth (HOSPITAL_COMMUNITY): Payer: Self-pay | Admitting: Pharmacist

## 2015-03-20 DIAGNOSIS — I10 Essential (primary) hypertension: Secondary | ICD-10-CM | POA: Diagnosis not present

## 2015-03-20 DIAGNOSIS — R079 Chest pain, unspecified: Secondary | ICD-10-CM | POA: Diagnosis not present

## 2015-03-20 DIAGNOSIS — R55 Syncope and collapse: Secondary | ICD-10-CM | POA: Diagnosis not present

## 2015-03-20 DIAGNOSIS — I5022 Chronic systolic (congestive) heart failure: Secondary | ICD-10-CM | POA: Diagnosis not present

## 2015-03-20 MED ORDER — FUROSEMIDE 20 MG PO TABS: 20.0000 mg | ORAL_TABLET | Freq: Every day | ORAL | Status: DC | PRN

## 2015-03-20 NOTE — Progress Notes (Signed)
Consult for Medication assistance Entresto  Patient was placed on Entresto 02/01/2015, has private insurance with [facility] co pay for medication is $60 per month. Patient used the coupon card for a free one month supply but did not follow through with activating the $10 per month coupon card / or calling for ongoing assistance. CM talked to [employer] Rep;  Patient is currently activating her $10 per month/ for one year coupon card.  CM called Heather Scrubb at Dr Gala Romney office, they will have some sample for her to pick up today.   CM talked to patient at length about being proactive and making sure that her medication does not run out or miss dosage to contact her physician for additional support for assistance. Abelino Derrick [facility] [phone]

## 2015-03-20 NOTE — Progress Notes (Signed)
Patient alert and oriented, no c/o cp or shortness of breath, v/s stable, tele and iv d/c, d/c instruction explain and given to the patient, all questions answered, patient verbalized understanding. D/c patient home per order.

## 2015-03-20 NOTE — Telephone Encounter (Signed)
Was asked to inquire about approval of Ms. Hoopingarner's Entresto. It is approved through the end of the year but I did verify with [facility] that her copay was $60 which may be because she has a deductible to meet. Since she has commercial [employer], she can still use the [employer] copay card so her copays should be no more than $10/mo.  Tyler Deis. Bonnye Fava, PharmD, BCPS, CPP Clinical Pharmacist Pager: [phone] Phone: [phone] 03/20/2015 10:44 AM

## 2015-03-20 NOTE — Discharge Summary (Signed)
Physician Discharge Summary  Pamela Brooks:[id number] DOB: 10/09/1970 DOA: 03/18/2015  PCP: Frazier Richards, PA-C  Admit date: 03/18/2015 Discharge date: 03/20/2015  Time spent: 50 minutes  Recommendations for Outpatient Follow-up:  1. Bmet in 1 wk   Discharge Condition: stable Diet recommendation: low sodium heart healthy  Discharge Diagnoses:  Principal Problem:   Near syncope Active Problems:   HTN (hypertension)   Non-ischemic cardiomyopathy (HCC)   Chronic systolic CHF (congestive heart failure) (HCC)   Chest pain   History of present illness:  Pamela Brooks is a 45 y.o. female htn, NICM EF 30% diagnosed in June 2016 with repeat echo in oct 2016 EF 30%, chronic CHF, undergoing evaluation for AICD placement comes in with sudden onset dizziness followed by vomiting x 3 episodes. She began to have chest pain after the vomiting started. No palpitations. She was due to have and cardiac MRI yesterday which she missed as she was in the hospital  Hospital Course:  Principal Problem:  Near syncope/ chest pain - chest pain occurred only after she started vomiting and was likely related to the the vomiting itself - Near syncope differential includes arrhythmia vs vasovagal episode- following on telemetry- no arrhythmias noted - cardiology suspects that she was taking Losartan and Entresto at the same time and possibly became dehydrated/ hypotensive from this - cardiac enzymes negative - she was due for a Cardiac MRI on the day of admission as outpatient therefore it was done here- this reveals that her EF has normalized and therefore she has no need for an AICD - advised to only use Lasix PRN for weight gain and dyspnea - she was not taking her Aldactone at home and this had been discontinued from her list as well  Active Problems:  AKI - resolved with holding diuretic, Entresto and Losartan   HTN (hypertension) -cont Coreg   Non-ischemic cardiomyopathy / Chronic systolic CHF  (congestive heart failure) - lasix on held due to AKI- now to use only PRN as EF has improved to 55% per cardiac MRI   Consultations:  Cardiology   Discharge Exam: Filed Weights   03/18/15 1702 03/19/15 0544 03/20/15 0651  Weight: 75.252 kg (165 lb 14.4 oz) 76.023 kg (167 lb 9.6 oz) 75.841 kg (167 lb 3.2 oz)   Filed Vitals:   03/20/15 0651 03/20/15 1008  BP: 107/64 113/66  Pulse:  68  Temp: 98.3 F (36.8 C) 98.4 F (36.9 C)  Resp: 18     General: AAO x 3, no distress Cardiovascular: RRR, no murmurs  Respiratory: clear to auscultation bilaterally GI: soft, non-tender, non-distended, bowel sound positive  Discharge Instructions You were cared for by a hospitalist during your hospital stay. If you have any questions about your discharge medications or the care you received while you were in the hospital after you are discharged, you can call the unit and asked to speak with the hospitalist on call if the hospitalist that took care of you is not available. Once you are discharged, your primary care physician will handle any further medical issues. Please note that NO REFILLS for any discharge medications will be authorized once you are discharged, as it is imperative that you return to your primary care physician (or establish a relationship with a primary care physician if you do not have one) for your aftercare needs so that they can reassess your need for medications and monitor your lab values.  Discharge Instructions    (HEART FAILURE PATIENTS) Call MD:  Anytime you have any of the following symptoms: 1) 3 pound weight gain in 24 hours or 5 pounds in 1 week 2) shortness of breath, with or without a dry hacking cough 3) swelling in the hands, feet or stomach 4) if you have to sleep on extra pillows at night in order to breathe.    Complete by:  As directed      Diet - low sodium heart healthy    Complete by:  As directed      Increase activity slowly    Complete by:  As directed              Medication List    STOP taking these medications        losartan 50 MG tablet  Commonly known as:  COZAAR     spironolactone 25 MG tablet  Commonly known as:  ALDACTONE      TAKE these medications        aspirin 81 MG EC tablet  Take 1 tablet (81 mg total) by mouth daily.     atorvastatin 20 MG tablet  Commonly known as:  LIPITOR  Take 1 tablet (20 mg total) by mouth daily.     carvedilol 12.5 MG tablet  Commonly known as:  COREG  Take 1 tablet (12.5 mg total) by mouth 2 (two) times daily with a meal.     furosemide 20 MG tablet  Commonly known as:  LASIX  Take 1 tablet (20 mg total) by mouth daily as needed.     sacubitril-valsartan 24-26 MG  Commonly known as:  ENTRESTO  Take 1 tablet by mouth 2 (two) times daily.       Allergies  Allergen Reactions  . Tramadol Swelling    Facial swelling       Follow-up Information    Follow up with [facility], PA-C On 03/27/2015.   Specialty:  Physician Assistant   Why:  At 11:15 am    Contact information:   [address] [place] [address] [phone]       Follow up with Tonye Becket, NP On 03/27/2015.   Specialty:  Cardiology   Why:  at 2:00 Garage Code 0001   Contact information:   [address] [place] [address] [phone]       - The results of significant diagnostics from this hospitalization (including imaging, microbiology, ancillary and laboratory) are listed below for reference.    Significant Diagnostic Studies: Dg Chest 2 View  03/18/2015  CLINICAL DATA:  Chest pain and near syncope. EXAM: CHEST  2 VIEW COMPARISON:  CT chest 08/04/2014.  Chest 08/03/2014 FINDINGS: The heart size and mediastinal contours are within normal limits. Both lungs are clear. The visualized skeletal structures are unremarkable. IMPRESSION: No active cardiopulmonary disease. Electronically Signed   By: Burman Nieves M.D.   On: 03/18/2015 03:31   Mr Card Morphology Wo/w Cm  03/19/2015   CLINICAL DATA:  Cardiomyopathy of uncertain etiology EXAM: CARDIAC MRI TECHNIQUE: The patient was scanned on a 1.5 Tesla GE magnet. A dedicated cardiac coil was used. Functional imaging was done using Fiesta sequences. 2,3, and 4 chamber views were done to assess for RWMA's. Modified Simpson's rule using a short axis stack was used to calculate an ejection fraction on a dedicated work [employer]. The patient received 22 cc of Multihance. After 10 minutes inversion recovery sequences were used to assess for infiltration and scar tissue. CONTRAST:  22 cc  Multihance FINDINGS: Limited images of the lung fields showed no significant abnormalities. Normal left ventricular size and wall thickness. Normal wall motion with EF 55%. Normal right ventricular size and systolic function. Normal atrial sizes. Trileaflet aortic valve with no significant regurgitation or stenosis. Trivial mitral regurgitation. On delayed enhancement imaging, there was no late gadolinium enhancement (LGE) noted. MEASUREMENTS: MEASUREMENTS LVEDV 149 mL LV SV 81 mL LV EF 55% IMPRESSION: 1. Normal LV size and systolic function, EF 55%. This is improved compared to the prior echo. 2.  Normal RV size and systolic function. 3. No LGE, so no definitive evidence for prior MI, myocarditis, or infiltrative disease. Dalton Mclean Electronically Signed   By: Marca Ancona M.D.   On: 03/19/2015 22:24    Microbiology: Recent Results (from the past 240 hour(s))  Urine culture     Status: None   Collection Time: 03/18/15  3:32 AM  Result Value Ref Range Status   Specimen Description URINE, CLEAN CATCH  Final   Special Requests NONE  Final   Culture   Final    MULTIPLE SPECIES PRESENT, SUGGEST RECOLLECTION Performed at [facility]    Report Status 03/19/2015 FINAL  Final     Labs: Basic Metabolic Panel:  Recent Labs Lab 03/18/15 0235 03/19/15 0542  NA 139 139  K 4.0 4.2  CL 106 110  CO2 23 19*  GLUCOSE 118* 95   BUN 18 16  CREATININE 1.30* 1.02*  CALCIUM 9.0 8.8*   Liver Function Tests: No results for input(s): AST, ALT, ALKPHOS, BILITOT, PROT, ALBUMIN in the last 168 hours. No results for input(s): LIPASE, AMYLASE in the last 168 hours. No results for input(s): AMMONIA in the last 168 hours. CBC:  Recent Labs Lab 03/18/15 0235  WBC 10.2  HGB 14.6  HCT 43.3  MCV 87.3  PLT 217   Cardiac Enzymes:  Recent Labs Lab 03/18/15 0235 03/18/15 1827 03/18/15 2337 03/19/15 0542  TROPONINI <0.03 <0.03 <0.03 <0.03   BNP: BNP (last 3 results)  Recent Labs  08/04/14 1616 01/31/15 1046 03/18/15 0235  BNP 982.2* 16.0 18.0    ProBNP (last 3 results) No results for input(s): PROBNP in the last 8760 hours.  CBG: No results for input(s): GLUCAP in the last 168 hours.     SignedCalvert Cantor, MD [facility] 03/20/2015, 10:23 AM

## 2015-03-20 NOTE — Progress Notes (Signed)
Patient ID: Pamela Brooks, female   DOB: Jul 23, 1970, 45 y.o.   MRN: [id number]     SUBJECTIVE: No complaints this morning. No further lightheadedness.  Cardiac MRI: EF 55%, normal RV size and systolic function.   Scheduled Meds: . aspirin EC  81 mg Oral Daily  . atorvastatin  20 mg Oral Daily  . carvedilol  12.5 mg Oral BID WC  . sacubitril-valsartan  1 tablet Oral BID   Continuous Infusions:  PRN Meds:.acetaminophen, ondansetron (ZOFRAN) IV    Filed Vitals:   03/19/15 0900 03/19/15 2049 03/20/15 0037 03/20/15 0651  BP: 119/73 105/64 109/65 107/64  Pulse: 75 64 64   Temp:  98.5 F (36.9 C) 98.7 F (37.1 C) 98.3 F (36.8 C)  TempSrc:  Oral Oral Oral  Resp: 16 18 18 18   Height:      Weight:    167 lb 3.2 oz (75.841 kg)  SpO2: 100% 100% 100% 100%    Intake/Output Summary (Last 24 hours) at 03/20/15 0820 Last data filed at 03/20/15 0654  Gross per 24 hour  Intake    700 ml  Output    875 ml  Net   -175 ml    LABS: Basic Metabolic Panel:  Recent Labs  51/83/35 0235 03/19/15 0542  NA 139 139  K 4.0 4.2  CL 106 110  CO2 23 19*  GLUCOSE 118* 95  BUN 18 16  CREATININE 1.30* 1.02*  CALCIUM 9.0 8.8*   Liver Function Tests: No results for input(s): AST, ALT, ALKPHOS, BILITOT, PROT, ALBUMIN in the last 72 hours. No results for input(s): LIPASE, AMYLASE in the last 72 hours. CBC:  Recent Labs  03/18/15 0235  WBC 10.2  HGB 14.6  HCT 43.3  MCV 87.3  PLT 217   Cardiac Enzymes:  Recent Labs  03/18/15 1827 03/18/15 2337 03/19/15 0542  TROPONINI <0.03 <0.03 <0.03   BNP: Invalid input(s): POCBNP D-Dimer: No results for input(s): DDIMER in the last 72 hours. Hemoglobin A1C: No results for input(s): HGBA1C in the last 72 hours. Fasting Lipid Panel: No results for input(s): CHOL, HDL, LDLCALC, TRIG, CHOLHDL, LDLDIRECT in the last 72 hours. Thyroid Function Tests: No results for input(s): TSH, T4TOTAL, T3FREE, THYROIDAB in the last 72 hours.  Invalid  input(s): FREET3 Anemia Panel: No results for input(s): VITAMINB12, FOLATE, FERRITIN, TIBC, IRON, RETICCTPCT in the last 72 hours.  RADIOLOGY: Dg Chest 2 View  03/18/2015  CLINICAL DATA:  Chest pain and near syncope. EXAM: CHEST  2 VIEW COMPARISON:  CT chest 08/04/2014.  Chest 08/03/2014 FINDINGS: The heart size and mediastinal contours are within normal limits. Both lungs are clear. The visualized skeletal structures are unremarkable. IMPRESSION: No active cardiopulmonary disease. Electronically Signed   By: Burman Nieves M.D.   On: 03/18/2015 03:31   Mr Card Morphology Wo/w Cm  03/19/2015  CLINICAL DATA:  Cardiomyopathy of uncertain etiology EXAM: CARDIAC MRI TECHNIQUE: The patient was scanned on a 1.5 Tesla GE magnet. A dedicated cardiac coil was used. Functional imaging was done using Fiesta sequences. 2,3, and 4 chamber views were done to assess for RWMA's. Modified Simpson's rule using a short axis stack was used to calculate an ejection fraction on a dedicated work [employer] officer, [employer]. The patient received 22 cc of Multihance. After 10 minutes inversion recovery sequences were used to assess for infiltration and scar tissue. CONTRAST:  22 cc Multihance FINDINGS: Limited images of the lung fields showed no significant abnormalities. Normal left ventricular size and  wall thickness. Normal wall motion with EF 55%. Normal right ventricular size and systolic function. Normal atrial sizes. Trileaflet aortic valve with no significant regurgitation or stenosis. Trivial mitral regurgitation. On delayed enhancement imaging, there was no late gadolinium enhancement (LGE) noted. MEASUREMENTS: MEASUREMENTS LVEDV 149 mL LV SV 81 mL LV EF 55% IMPRESSION: 1. Normal LV size and systolic function, EF 55%. This is improved compared to the prior echo. 2.  Normal RV size and systolic function. 3. No LGE, so no definitive evidence for prior MI, myocarditis, or infiltrative disease. Pamela Brooks  Electronically Signed   By: Marca Ancona M.D.   On: 03/19/2015 22:24    PHYSICAL EXAM General: NAD Neck: No JVD, no thyromegaly or thyroid nodule.  Lungs: Clear to auscultation bilaterally with normal respiratory effort. CV: Nondisplaced PMI.  Heart regular S1/S2, no S3/S4, no murmur.  No peripheral edema.  No carotid bruit.  Normal pedal pulses.  Abdomen: Soft, nontender, no hepatosplenomegaly, no distention.  Neurologic: Alert and oriented x 3.  Psych: Normal affect. Extremities: No clubbing or cyanosis.   TELEMETRY: Reviewed telemetry pt in NSR, no significant events  ASSESSMENT AND PLAN: Pamela Brooks is a 45 year old with a history of nonischemic CMP admitted with presyncope.  1. Presyncope: Likely due to lowered BP from combination of Entresto and losartan.  BP stable this morning. No events on telemetry.  Presyncope seems more likely to be a vagal event in setting of low BP/dehydration.  She will stay off losartan and spironolactone and will use Lasix only as needed.  2. Chronic Systolic HF: NICM. Echo (6/16) with EF 25-30%, moderately dilated LV, moderate MR. Echo (10/16) with EF 30%, moderate LV dilation. NYHA II. Volume status stable. Cardiac MRI yesterday showed EF improved to 55%.  - She will not need an ICD.  - Continue carvedilol 12.5 mg twice a day and Entresto 24/26 bid..  - She should NOT take losartan.  - I will also have her stop spironolactone with recovered EF. - She can take Lasix at home as needed rather than daily.  3. CAD: LHC 07/2014- 50% mLCx stenosis. Continue aspirin, statin, and BB.   OK for home today.  Cardiac meds: Entresto 24/26 bid, ASA 81, Coreg 12.5 bid, atorvastatin 20 daily.  We will arrange followup in CHF clinic.   Marca Ancona 03/20/2015 8:24 AM

## 2015-03-27 ENCOUNTER — Encounter (HOSPITAL_COMMUNITY): Payer: 59

## 2015-03-27 ENCOUNTER — Inpatient Hospital Stay: Payer: 59 | Admitting: Physician Assistant

## 2015-04-02 NOTE — Progress Notes (Signed)
Patient ID: Pamela Brooks, female   DOB: December 31, 1970, 45 y.o.   MRN: [id number]    Advanced Heart Failure Clinic Note   PCP: Frazier Richards Cardiology: Dr. Eden Emms HF Cardiology: Dr. Shirlee Latch  45 yo with history of HTN and chronic systolic CHF presents for CHF clinic evaluation.  She is followed by Dr Eden Emms.  She went to the ER in 6/16 with dyspnea and peripheral edema. No chest pain.  No prior viral-type illness.  She was found to be in CHF, echo with EF 25-30%.  LHC was done, showing nonobstructive stenosis in the circumflex.  She was diuresed and BP controlled.    Admitted 1/23 -03/20/15 with hypotension and near syncope. It was discovered pt had continued her losartan on top of her Entresto. Meds helded and then resumed WITHOUT losartan.  Cleda Daub stopped with hypotension and improved EF. Discharge weight 167 lbs.  She returns today for post hospital follow up. Feels good overall. No more lightheadedness or near-syncope.  Still working full time. Can easily walk 20-30 minutes with no problems, not sure her upper limit.  No orthopnea/PND.  No CP or palpitations. No DOE on steps or inclines. No longer taking losartan.  Has not needed any lasix. Weight up a few lbs since last visit, but feels better with correct medicine dosages, appetite improved.   Labs (7/16): BNP 25, K 4.5, creatinine 1.13 Labs (10/16): SPEP negative, HIV negative, K 3.5, creatinine 1.08 Labs (1/17): K 4.2, creatinine 1.02  PMH: 1. HTN 2. Chronic systolic CHF: Nonischemic cardiomyopathy, ?due to HTN.  LHC (6/16) with 50% mLCx stenosis.  Echo (6/16) with EF 25-30%, moderately dilated LV, moderate MR.  Echo (10/16) with EF 30%, moderate LV dilation.  cMRI  03/19/15 LVEF 55%, normal LV, Normal RV, no LGE, so no definitive evidence for prior MI, myocarditis, or infiltrative disease. 3. CAD: LHC (6/16) with 50% mLCx stenosis.   SH: Lives with parents in [place].  Works in Science writer.  Non smoker, no ETOH.   FH: Father and  mother with HTN.  No CHF or CAD that she knows of.    ROS: All systems reviewed and negative except as per HPI.   Current Outpatient Prescriptions  Medication Sig Dispense Refill  . aspirin EC 81 MG EC tablet Take 1 tablet (81 mg total) by mouth daily. 30 tablet 0  . atorvastatin (LIPITOR) 20 MG tablet Take 1 tablet (20 mg total) by mouth daily. 30 tablet 3  . carvedilol (COREG) 12.5 MG tablet Take 1 tablet (12.5 mg total) by mouth 2 (two) times daily with a meal. 60 tablet 6  . furosemide (LASIX) 20 MG tablet Take 1 tablet (20 mg total) by mouth daily as needed. 30 tablet 0  . sacubitril-valsartan (ENTRESTO) 24-26 MG Take 1 tablet by mouth 2 (two) times daily. 60 tablet 6   No current facility-administered medications for this encounter.   Filed Vitals:   04/03/15 1045  BP: 104/78  Pulse: 81  Weight: 171 lb (77.565 kg)  SpO2: 100%   Wt Readings from Last 3 Encounters:  04/03/15 171 lb (77.565 kg)  03/20/15 167 lb 3.2 oz (75.841 kg)  01/31/15 169 lb 4 oz (76.771 kg)     General: NAD, very pleasant Neck: No JVD, no thyromegaly or thyroid nodule.  Lungs: Clear to auscultation bilaterally with normal respiratory effort. CV: Nondisplaced PMI.  Heart regular S1/S2, no S3, +S4, no murmur.  No peripheral edema.  No carotid bruit.  Normal pedal pulses.  Abdomen: Soft, NT, ND, no HSM. No bruits or masses. +BS  Skin: Intact without lesions or rashes.  Neurologic: Alert and oriented x 3.  Psych: Normal affect. Extremities: No clubbing or cyanosis.  HEENT: Normal.   Assessment/Plan: 1. Chronic systolic CHF: Nonischemic cardiomyopathy.  This could be due to poorly controlled HTN.  Possible viral myocarditis.  NYHA class II symptoms, seems to be doing well currently.  - EF improved from 30% by last echo to 55% by cMRI 1/17.  - Volume status stable on exam. - Continue Coreg.  Will check BMET today and BNP today - Continue entresto 24/26 mg BID. No room for uptitration with soft BP -  Cleda Daub held during recent admit with hypotension.  Do not need to add back with improved EF.  - Taking lasix only as needed. Has not needed since left the hospital.  - cMRI  03/19/15 LVEF 55%, normal LV, Normal RV, no LGE, so no definitive evidence for prior MI, myocarditis, or infiltrative disease. 2. CAD: Moderate nonobstructive CAD.  She is on ASA 81.   - Continue atorvastatin 20 mg daily. Check lipids in 1/17. 3. HTN:  - Stable on current meds. Will not up-titrate today with improved EF and recent hypotension.  No med changes. Stable on current med, EF improved, BP also soft.  Follow up 4-5 weeks with Dr Shirlee Latch.   Mariam Dollar Tillery PA-C 04/02/2015

## 2015-04-03 ENCOUNTER — Ambulatory Visit (HOSPITAL_COMMUNITY)
Admission: RE | Admit: 2015-04-03 | Discharge: 2015-04-03 | Disposition: A | Discharge status: Home / Self Care | Payer: 59 | Source: Ambulatory Visit | Attending: Internal Medicine | Admitting: Internal Medicine

## 2015-04-03 VITALS — BP 104/78 | HR 81 | Wt 171.0 lb

## 2015-04-03 DIAGNOSIS — I10 Essential (primary) hypertension: Secondary | ICD-10-CM | POA: Diagnosis not present

## 2015-04-03 DIAGNOSIS — I110 Hypertensive heart disease with heart failure: Secondary | ICD-10-CM | POA: Diagnosis not present

## 2015-04-03 DIAGNOSIS — Z8249 Family history of ischemic heart disease and other diseases of the circulatory system: Secondary | ICD-10-CM | POA: Diagnosis not present

## 2015-04-03 DIAGNOSIS — Z7982 Long term (current) use of aspirin: Secondary | ICD-10-CM | POA: Insufficient documentation

## 2015-04-03 DIAGNOSIS — R55 Syncope and collapse: Secondary | ICD-10-CM

## 2015-04-03 DIAGNOSIS — I5022 Chronic systolic (congestive) heart failure: Secondary | ICD-10-CM

## 2015-04-03 DIAGNOSIS — I2510 Atherosclerotic heart disease of native coronary artery without angina pectoris: Secondary | ICD-10-CM | POA: Diagnosis not present

## 2015-04-03 DIAGNOSIS — Z79899 Other long term (current) drug therapy: Secondary | ICD-10-CM | POA: Diagnosis not present

## 2015-04-03 DIAGNOSIS — I428 Other cardiomyopathies: Secondary | ICD-10-CM | POA: Insufficient documentation

## 2015-04-03 DIAGNOSIS — I420 Dilated cardiomyopathy: Secondary | ICD-10-CM

## 2015-04-03 LAB — BASIC METABOLIC PANEL
Anion gap: 15 (ref 5–15)
BUN: 18 mg/dL (ref 6–20)
CALCIUM: 9 mg/dL (ref 8.9–10.3)
CO2: 17 mmol/L — ABNORMAL LOW (ref 22–32)
CREATININE: 1.12 mg/dL — AB (ref 0.44–1.00)
Chloride: 109 mmol/L (ref 101–111)
GFR calc Af Amer: >60 mL/min (ref >60)
GFR, EST NON AFRICAN AMERICAN: 58 mL/min — AB (ref >60)
Glucose, Bld: 76 mg/dL (ref 65–99)
POTASSIUM: 4.1 mmol/L (ref 3.5–5.1)
SODIUM: 141 mmol/L (ref 135–145)

## 2015-04-03 LAB — CHOLESTEROL, TOTAL: Cholesterol: 102 mg/dL (ref 0–200)

## 2015-04-03 LAB — BRAIN NATRIURETIC PEPTIDE: B NATRIURETIC PEPTIDE 5: 17.5 pg/mL (ref 0.0–100.0)

## 2015-04-03 MED ORDER — ATORVASTATIN CALCIUM 20 MG PO TABS: 20.0000 mg | ORAL_TABLET | Freq: Every day | ORAL | Status: DC

## 2015-04-03 MED ORDER — CARVEDILOL 12.5 MG PO TABS: 12.5000 mg | ORAL_TABLET | Freq: Two times a day (BID) | ORAL | Status: DC

## 2015-04-03 NOTE — Patient Instructions (Signed)
Labs today  Your physician recommends that you schedule a follow-up appointment in: 4-5 weeks with Dr.McLean  Do the following things EVERYDAY: 1) Weigh yourself in the morning before breakfast. Write it down and keep it in a log. 2) Take your medicines as prescribed 3) Eat low salt foods-Limit salt (sodium) to 2000 mg per day.  4) Stay as active as you can everyday 5) Limit all fluids for the day to less than 2 liters 6)

## 2015-05-02 ENCOUNTER — Encounter (HOSPITAL_COMMUNITY): Payer: Self-pay

## 2015-05-02 ENCOUNTER — Ambulatory Visit (HOSPITAL_COMMUNITY)
Admission: RE | Admit: 2015-05-02 | Discharge: 2015-05-02 | Disposition: A | Discharge status: Home / Self Care | Payer: 59 | Source: Ambulatory Visit | Attending: Cardiology | Admitting: Cardiology

## 2015-05-02 VITALS — BP 120/72 | HR 83 | Wt 173.0 lb

## 2015-05-02 DIAGNOSIS — Z7982 Long term (current) use of aspirin: Secondary | ICD-10-CM | POA: Insufficient documentation

## 2015-05-02 DIAGNOSIS — Z79899 Other long term (current) drug therapy: Secondary | ICD-10-CM | POA: Insufficient documentation

## 2015-05-02 DIAGNOSIS — I2510 Atherosclerotic heart disease of native coronary artery without angina pectoris: Secondary | ICD-10-CM | POA: Diagnosis not present

## 2015-05-02 DIAGNOSIS — I5022 Chronic systolic (congestive) heart failure: Secondary | ICD-10-CM | POA: Diagnosis not present

## 2015-05-02 DIAGNOSIS — Z8249 Family history of ischemic heart disease and other diseases of the circulatory system: Secondary | ICD-10-CM | POA: Diagnosis not present

## 2015-05-02 DIAGNOSIS — I110 Hypertensive heart disease with heart failure: Secondary | ICD-10-CM | POA: Diagnosis not present

## 2015-05-02 DIAGNOSIS — I428 Other cardiomyopathies: Secondary | ICD-10-CM | POA: Diagnosis not present

## 2015-05-02 NOTE — Patient Instructions (Signed)
Follow up with Dr.McLean in 6 months 

## 2015-05-02 NOTE — Progress Notes (Signed)
Patient ID: Pamela Brooks, female   DOB: Nov 11, 1970, 45 y.o.   MRN: [id number]    Advanced Heart Failure Clinic Note   PCP: Pamela Brooks Cardiology: Dr. Eden Brooks HF Cardiology: Dr. Shirlee Brooks  45 yo with history of HTN and chronic systolic CHF presents for CHF clinic evaluation.  She is followed by Dr Pamela Brooks.  She went to the ER in 6/16 with dyspnea and peripheral edema. No chest pain.  No prior viral-type illness.  She was found to be in CHF, echo with EF 25-30%.  LHC was done, showing nonobstructive stenosis in the circumflex.  She was diuresed and BP controlled.    Cardiac MRI in 1/17 showed EF improved to 55%. Admitted 1/23 -03/20/15 with hypotension and near syncope. It was discovered pt had continued her losartan on top of her Entresto. Meds helded and then resumed without losartan.  Spironolactone stopped with hypotension and improved EF. Discharge weight 167 lbs.  She returns for followup today.  Doing well, no lightheadedness or syncope.  No exertional dyspnea or chest pain.  No orthopnea/PND.  Weight stable.   Labs (7/16): BNP 25, K 4.5, creatinine 1.13 Labs (10/16): SPEP negative, HIV negative, K 3.5, creatinine 1.08 Labs (1/17): K 4.2, creatinine 1.02 Labs (2/17): K 4.1, creatinine 1.12, total cholesterol 102, BNP 17.5.   PMH: 1. HTN 2. Chronic systolic CHF: Nonischemic cardiomyopathy, ?due to myocarditis versus HTN.   - LHC (6/16) with 50% mLCx stenosis.  Echo (6/16) with EF 25-30%, moderately dilated LV, moderate MR.   - Echo (10/16) with EF 30%, moderate LV dilation.  - cMRI  03/19/15 LVEF 55%, normal RV, no LGE, so no definitive evidence for prior MI, myocarditis, or infiltrative disease. 3. CAD: LHC (6/16) with 50% mLCx stenosis.   SH: Lives with parents in [place].  Works in Science writer.  Non smoker, no ETOH.   FH: Father and mother with HTN.  No CHF or CAD that she knows of.    ROS: All systems reviewed and negative except as per HPI.   Current Outpatient  Prescriptions  Medication Sig Dispense Refill  . aspirin EC 81 MG EC tablet Take 1 tablet (81 mg total) by mouth daily. 30 tablet 0  . atorvastatin (LIPITOR) 20 MG tablet Take 1 tablet (20 mg total) by mouth daily. 30 tablet 3  . carvedilol (COREG) 12.5 MG tablet Take 1 tablet (12.5 mg total) by mouth 2 (two) times daily with a meal. 60 tablet 6  . sacubitril-valsartan (ENTRESTO) 24-26 MG Take 1 tablet by mouth 2 (two) times daily. 60 tablet 6  . furosemide (LASIX) 20 MG tablet Take 1 tablet (20 mg total) by mouth daily as needed. (Patient not taking: Reported on 04/03/2015) 30 tablet 0   No current facility-administered medications for this encounter.   Filed Vitals:   05/02/15 1020  BP: 120/72  Pulse: 83  Weight: 173 lb (78.472 kg)  SpO2: 100%   Wt Readings from Last 3 Encounters:  05/02/15 173 lb (78.472 kg)  04/03/15 171 lb (77.565 kg)  03/20/15 167 lb 3.2 oz (75.841 kg)     General: NAD, very pleasant Neck: No JVD, no thyromegaly or thyroid nodule.  Lungs: Clear to auscultation bilaterally with normal respiratory effort. CV: Nondisplaced PMI.  Heart regular S1/S2, no S3, +S4, no murmur.  No peripheral edema.  No carotid bruit.  Normal pedal pulses.  Abdomen: Soft, NT, ND, no HSM. No bruits or masses. +BS  Skin: Intact without lesions or rashes.  Neurologic: Alert and oriented x 3.  Psych: Normal affect. Extremities: No clubbing or cyanosis.  HEENT: Normal.   Assessment/Plan: 1. Chronic systolic CHF: Nonischemic cardiomyopathy.  Cardiac MRI from 1/17 showed improvement in EF up to 55%.  NYHA class I-II symptoms.  No volume overload on exam.  Possible viral myocarditis with recovery.   - Continue Coreg and Entresto for now without changes.  2. CAD: Moderate nonobstructive CAD.   - She is on ASA 81.   - Continue atorvastatin 20 mg daily. Good lipids in 2/17.  3. HTN: Stable on current meds.   She can followup in 6 months.   Pamela Brooks  05/02/2015

## 2015-10-31 ENCOUNTER — Other Ambulatory Visit (HOSPITAL_COMMUNITY): Payer: Self-pay | Admitting: Internal Medicine

## 2015-10-31 DIAGNOSIS — I420 Dilated cardiomyopathy: Secondary | ICD-10-CM

## 2015-11-29 ENCOUNTER — Other Ambulatory Visit (HOSPITAL_COMMUNITY): Payer: Self-pay | Admitting: Internal Medicine

## 2016-04-08 ENCOUNTER — Telehealth (HOSPITAL_COMMUNITY): Payer: Self-pay | Admitting: Pharmacist

## 2016-04-08 ENCOUNTER — Telehealth (HOSPITAL_COMMUNITY): Payer: Self-pay | Admitting: Cardiology

## 2016-04-08 ENCOUNTER — Ambulatory Visit (HOSPITAL_COMMUNITY)
Admission: RE | Admit: 2016-04-08 | Discharge: 2016-04-08 | Disposition: A | Discharge status: Home / Self Care | Payer: Managed Care, Other (non HMO) | Source: Ambulatory Visit | Attending: Cardiology | Admitting: Cardiology

## 2016-04-08 ENCOUNTER — Encounter (HOSPITAL_COMMUNITY): Payer: Self-pay

## 2016-04-08 VITALS — BP 150/94 | HR 83 | Wt 172.5 lb

## 2016-04-08 DIAGNOSIS — I428 Other cardiomyopathies: Secondary | ICD-10-CM | POA: Insufficient documentation

## 2016-04-08 DIAGNOSIS — I2510 Atherosclerotic heart disease of native coronary artery without angina pectoris: Secondary | ICD-10-CM | POA: Insufficient documentation

## 2016-04-08 DIAGNOSIS — I4581 Long QT syndrome: Secondary | ICD-10-CM | POA: Diagnosis not present

## 2016-04-08 DIAGNOSIS — I5022 Chronic systolic (congestive) heart failure: Secondary | ICD-10-CM | POA: Insufficient documentation

## 2016-04-08 DIAGNOSIS — Z79899 Other long term (current) drug therapy: Secondary | ICD-10-CM | POA: Diagnosis not present

## 2016-04-08 DIAGNOSIS — Z7982 Long term (current) use of aspirin: Secondary | ICD-10-CM | POA: Diagnosis not present

## 2016-04-08 DIAGNOSIS — I110 Hypertensive heart disease with heart failure: Secondary | ICD-10-CM | POA: Diagnosis not present

## 2016-04-08 LAB — CBC
HEMATOCRIT: 39.5 % (ref 36.0–46.0)
Hemoglobin: 12.8 g/dL (ref 12.0–15.0)
MCH: 26.9 pg (ref 26.0–34.0)
MCHC: 32.4 g/dL (ref 30.0–36.0)
MCV: 83 fL (ref 78.0–100.0)
Platelets: 223 10*3/uL (ref 150–400)
RBC: 4.76 MIL/uL (ref 3.87–5.11)
RDW: 13.5 % (ref 11.5–15.5)
WBC: 6.5 10*3/uL (ref 4.0–10.5)

## 2016-04-08 LAB — BASIC METABOLIC PANEL
Anion gap: 9 (ref 5–15)
BUN: 9 mg/dL (ref 6–20)
CHLORIDE: 105 mmol/L (ref 101–111)
CO2: 24 mmol/L (ref 22–32)
Calcium: 9 mg/dL (ref 8.9–10.3)
Creatinine, Ser: 1.07 mg/dL — ABNORMAL HIGH (ref 0.44–1.00)
GFR calc Af Amer: >60 mL/min (ref >60)
GFR calc non Af Amer: >60 mL/min (ref >60)
Glucose, Bld: 123 mg/dL — ABNORMAL HIGH (ref 65–99)
POTASSIUM: 3.2 mmol/L — AB (ref 3.5–5.1)
SODIUM: 138 mmol/L (ref 135–145)

## 2016-04-08 LAB — LIPID PANEL
CHOL/HDL RATIO: 2.3 ratio
Cholesterol: 144 mg/dL (ref 0–200)
HDL: 63 mg/dL (ref >40)
LDL CALC: 66 mg/dL (ref 0–99)
Triglycerides: 73 mg/dL (ref <150)
VLDL: 15 mg/dL (ref 0–40)

## 2016-04-08 LAB — MAGNESIUM: MAGNESIUM: 1.8 mg/dL (ref 1.7–2.4)

## 2016-04-08 MED ORDER — POTASSIUM CHLORIDE CRYS ER 20 MEQ PO TBCR: 20.0000 meq | EXTENDED_RELEASE_TABLET | Freq: Every day | ORAL | 3 refills | Status: DC

## 2016-04-08 NOTE — Telephone Encounter (Addendum)
Entresto 24-26 mg BID PA approved by [employer] through 04/08/17.   Tyler Deis. Bonnye Fava, PharmD, BCPS, CPP Clinical Pharmacist Pager: [phone] Phone: [phone] 04/08/2016 10:51 AM

## 2016-04-08 NOTE — Progress Notes (Signed)
Patient ID: Pamela Brooks, female   DOB: 02/03/1971, 46 y.o.   MRN: [id number]    Advanced Heart Failure Clinic Note   PCP: [facility] Cardiology: Dr. Shirlee Latch  46 yo with history of HTN and chronic systolic CHF presents for CHF clinic evaluation.  She went to the ER in 6/16 with dyspnea and peripheral edema. No chest pain.  No prior viral-type illness.  She was found to be in CHF, echo with EF 25-30%.  LHC was done, showing nonobstructive stenosis in the circumflex.  She was diuresed and BP controlled.    Cardiac MRI in 1/17 showed EF improved to 55%. Admitted 1/23 -03/20/15 with hypotension and near syncope. It was discovered pt had continued her losartan on top of her Entresto. Meds helded and then resumed without losartan.  Spironolactone stopped with hypotension and improved EF. Discharge weight 167 lbs.  She returns for followup today.  Doing well, no lightheadedness or syncope.  No exertional dyspnea or chest pain.  No orthopnea/PND.  Weight stable. She does some walking for exercise.  BP elevated today but she has not taken her morning meds yet.   ECG: NSR, QTc 504 msec  PMH: 1. HTN 2. Chronic systolic CHF: Nonischemic cardiomyopathy, ?due to myocarditis versus HTN.   - LHC (6/16) with 50% mLCx stenosis.  Echo (6/16) with EF 25-30%, moderately dilated LV, moderate MR.   - Echo (10/16) with EF 30%, moderate LV dilation.  - cMRI  03/19/15 LVEF 55%, normal RV, no LGE, so no definitive evidence for prior MI, myocarditis, or infiltrative disease. 3. CAD: LHC (6/16) with 50% mLCx stenosis.   SH: Lives with parents in [place].  Works in Science writer.  Non smoker, no ETOH.   FH: Father and mother with HTN.  No CHF or CAD that she knows of.    ROS: All systems reviewed and negative except as per HPI.   Current Outpatient Prescriptions  Medication Sig Dispense Refill  . aspirin EC 81 MG EC tablet Take 1 tablet (81 mg total) by mouth daily. 30 tablet 0  . atorvastatin (LIPITOR)  20 MG tablet TAKE ONE TABLET BY MOUTH ONCE DAILY 30 tablet 3  . carvedilol (COREG) 12.5 MG tablet Take 1 tablet (12.5 mg total) by mouth 2 (two) times daily with a meal. 60 tablet 6  . ENTRESTO 24-26 MG TAKE ONE TABLET BY MOUTH TWICE DAILY 60 tablet 6  . furosemide (LASIX) 20 MG tablet Take 1 tablet (20 mg total) by mouth daily as needed. (Patient not taking: Reported on 04/03/2015) 30 tablet 0  . potassium chloride SA (K-DUR,KLOR-CON) 20 MEQ tablet Take 1 tablet (20 mEq total) by mouth daily. 90 tablet 3   No current facility-administered medications for this encounter.    Vitals:   04/08/16 1032  BP: (!) 150/94  Pulse: 83  SpO2: 100%  Weight: 172 lb 8 oz (78.2 kg)   Wt Readings from Last 3 Encounters:  04/08/16 172 lb 8 oz (78.2 kg)  05/02/15 173 lb (78.5 kg)  04/03/15 171 lb (77.6 kg)     General: NAD, very pleasant Neck: No JVD, no thyromegaly or thyroid nodule.  Lungs: Clear to auscultation bilaterally with normal respiratory effort. CV: Nondisplaced PMI.  Heart regular S1/S2, no S3, +S4, no murmur.  No peripheral edema.  No carotid bruit.  Normal pedal pulses.  Abdomen: Soft, NT, ND, no HSM. No bruits or masses. +BS  Skin: Intact without lesions or rashes.  Neurologic: Alert and  oriented x 3.  Psych: Normal affect. Extremities: No clubbing or cyanosis.  HEENT: Normal.   Assessment/Plan: 1. Chronic systolic CHF: Nonischemic cardiomyopathy.  Cardiac MRI from 1/17 showed improvement in EF up to 55%.  NYHA class I-II symptoms.  No volume overload on exam.  Possible viral myocarditis with recovery.   - Continue Coreg and Entresto for now without changes.  BMET today. - Repeat echo to ensure that EF has remained stable.  2. CAD: Moderate nonobstructive CAD.   - She is on ASA 81.   - Continue atorvastatin 20 mg daily. Check lipids today.  3. HTN: Stable on current meds.  4. Prolonged QT interval: Probably not medication-related.  I will check K and Mg level today.   She can  followup in 6 months if echo remains stable.   Marca Ancona  04/08/2016

## 2016-04-08 NOTE — Telephone Encounter (Signed)
-----   Message from Laurey Morale, MD sent at 04/08/2016  4:04 PM EST ----- Add 20 mEq KCl to her daily regimen.  Good lipids.

## 2016-04-08 NOTE — Telephone Encounter (Signed)
Patient aware. Pt voiced understanding 

## 2016-04-08 NOTE — Patient Instructions (Signed)
Labs today (will call for abnormal results, otherwise no news is good news)  Echo has been ordered for you  Follow up in 6 Months

## 2016-05-07 ENCOUNTER — Ambulatory Visit (HOSPITAL_COMMUNITY): Payer: Managed Care, Other (non HMO)

## 2016-05-12 ENCOUNTER — Other Ambulatory Visit (HOSPITAL_COMMUNITY): Payer: Self-pay | Admitting: Internal Medicine

## 2016-06-01 ENCOUNTER — Encounter (HOSPITAL_COMMUNITY): Payer: Self-pay | Admitting: *Deleted

## 2016-07-30 IMAGING — US US ABDOMEN LIMITED
1 series · 14 of 25 positions shown · non-contrast
Comparison: None.

CLINICAL DATA: Right upper quadrant pain

EXAM:
US ABDOMEN LIMITED - RIGHT UPPER QUADRANT

[Series 1: us abdomen limited · 0.15mm/px · 14 of 58 slices shown]
[im 1/58]
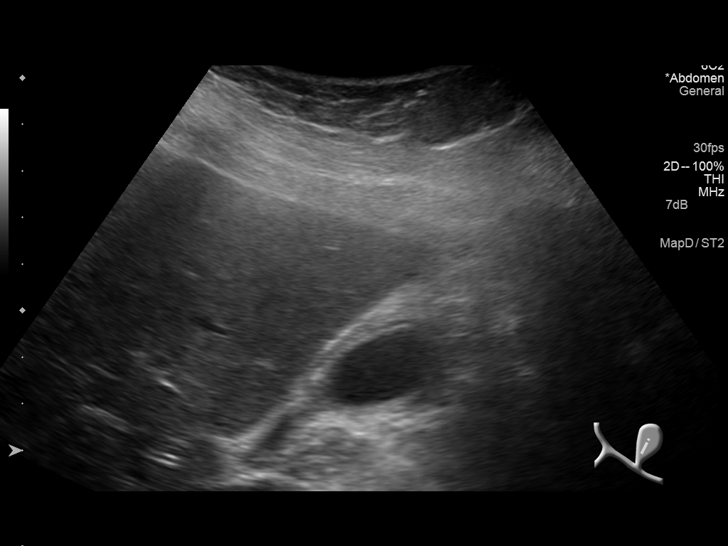
[im 5/58]
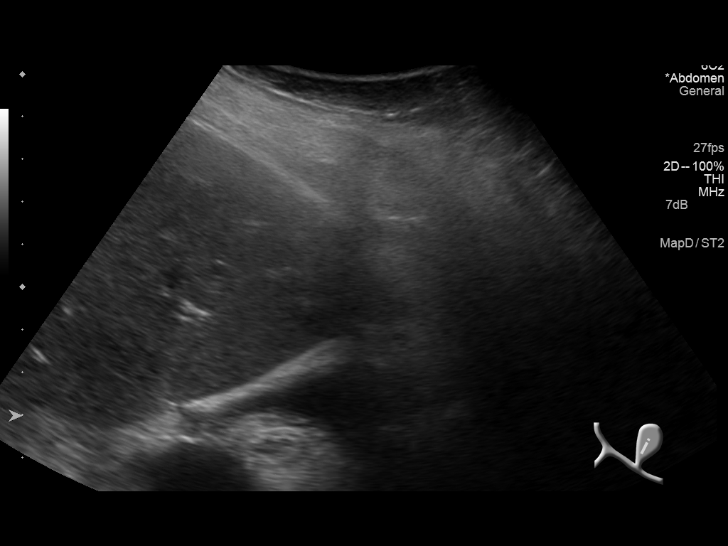
[im 10/58]
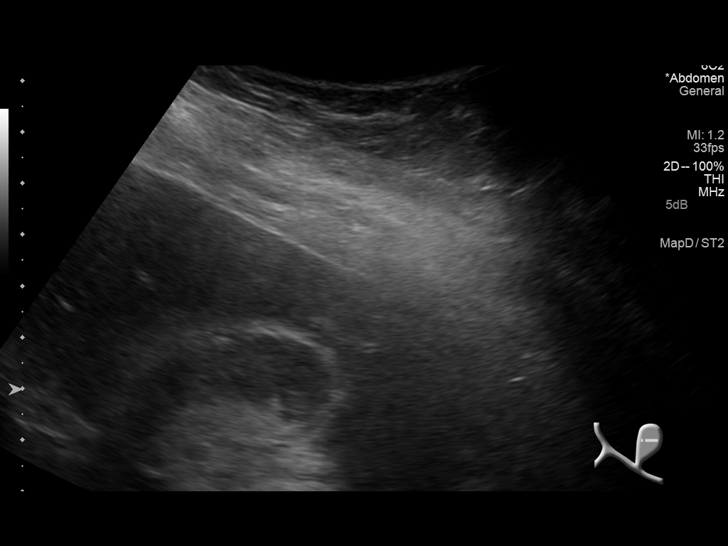
[im 15/58]
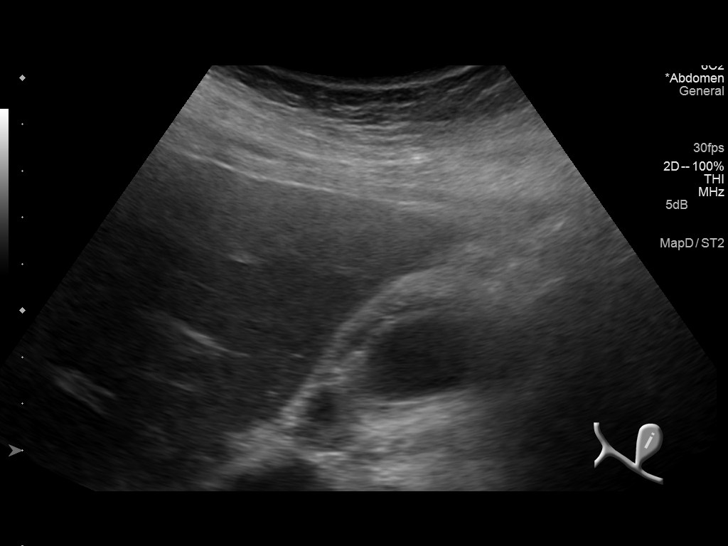
[im 20/58]
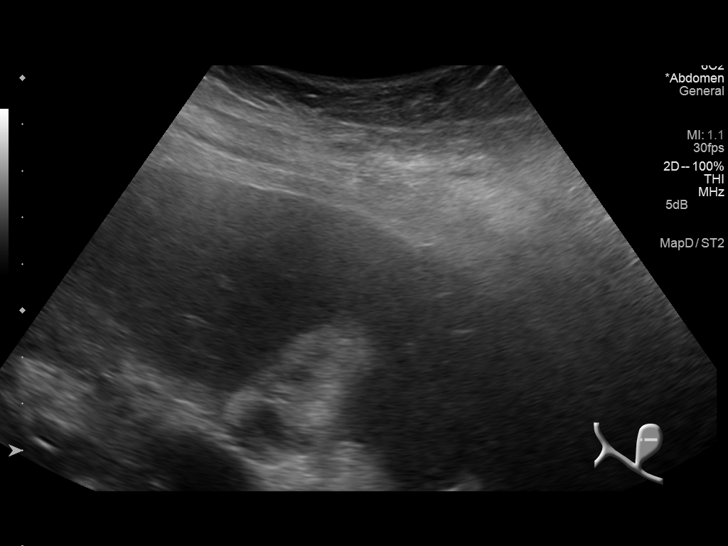
[im 22/58]
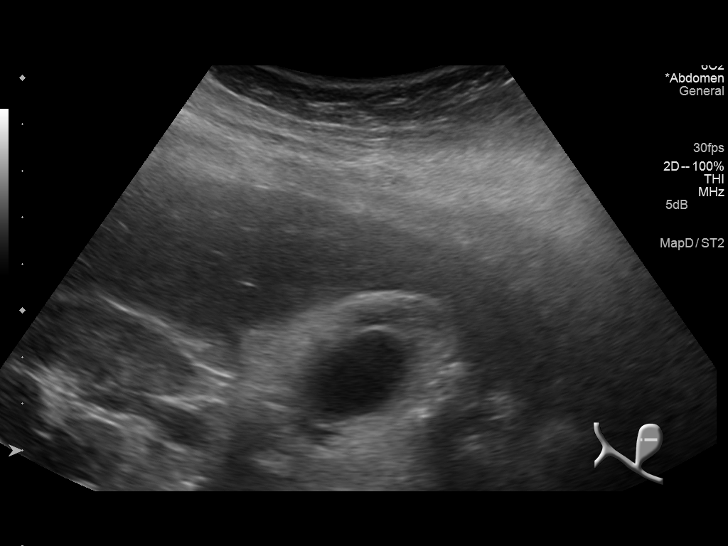
[im 27/58]
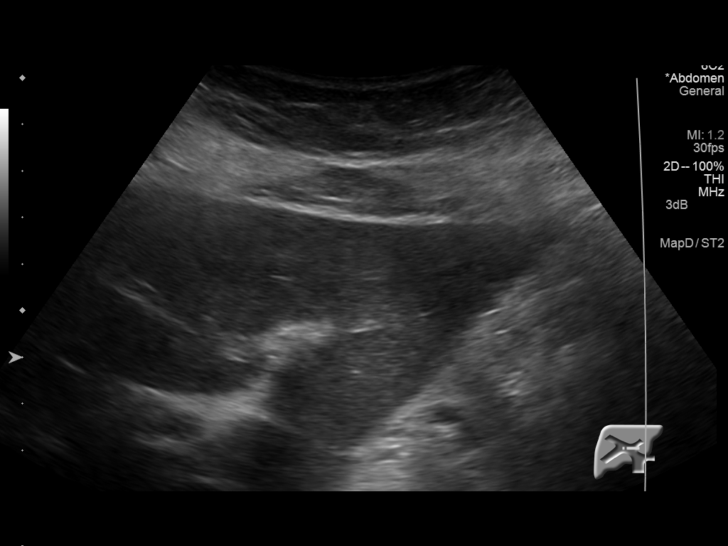
[im 31/58]
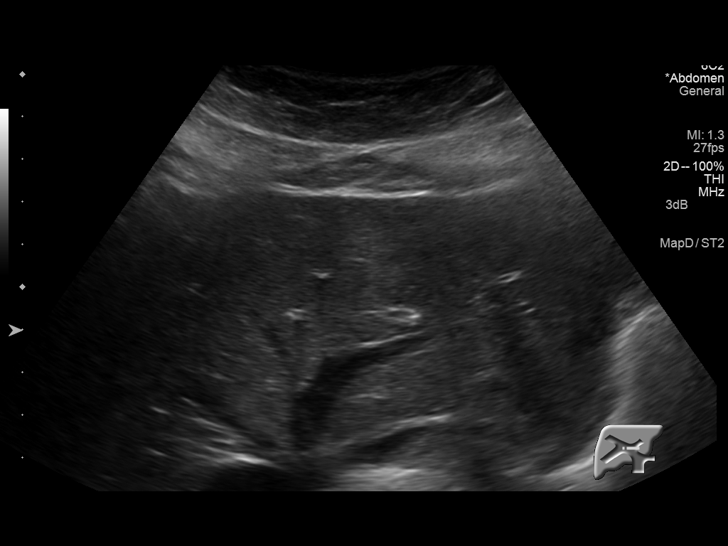
[im 36/58]
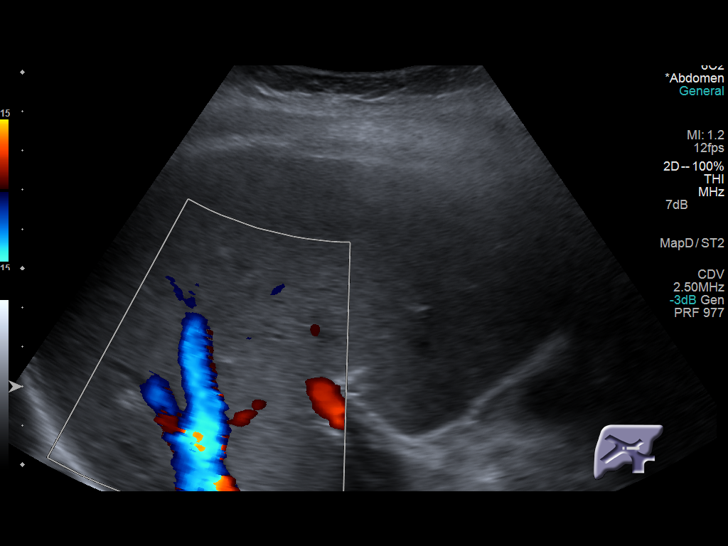
[im 39/58]
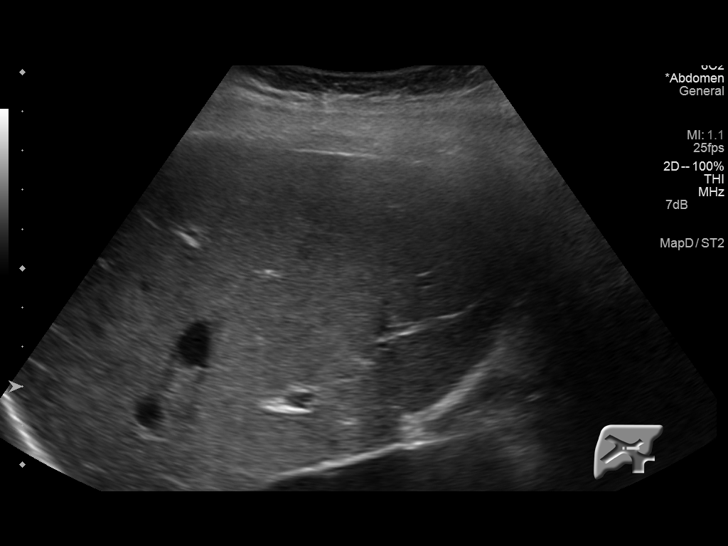
[im 43/58]
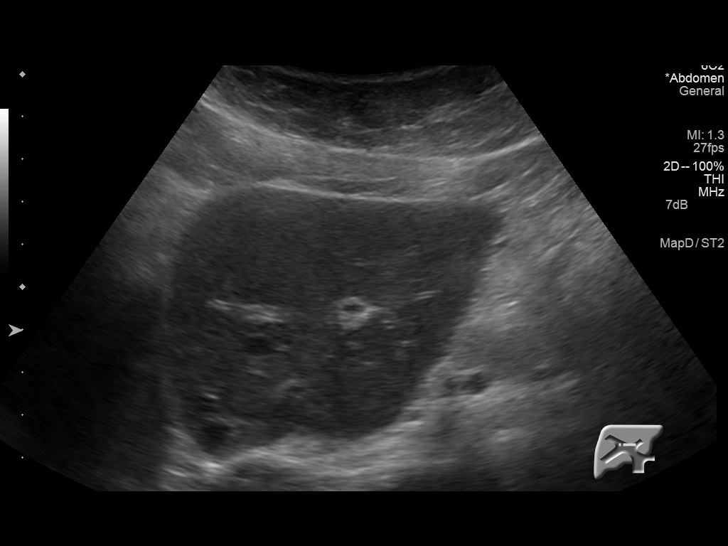
[im 48/58]
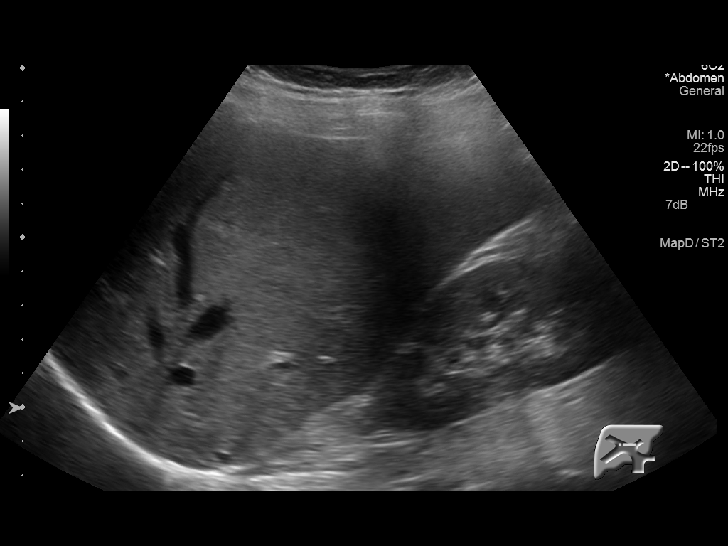
[im 53/58]
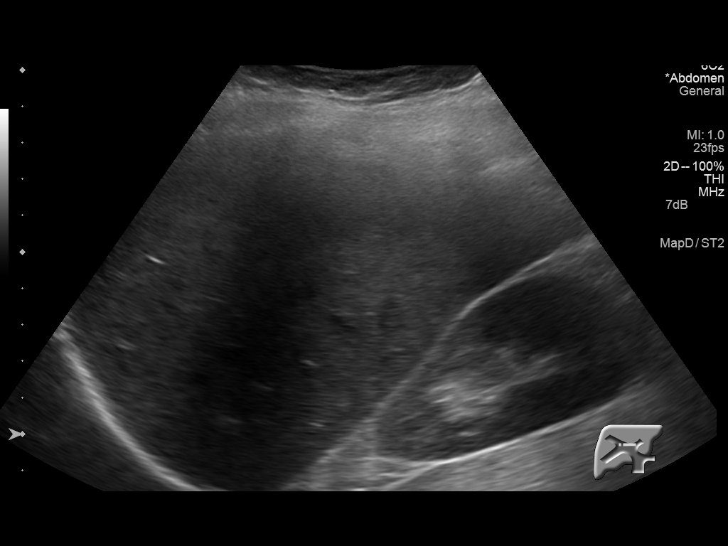
[im 58/58]
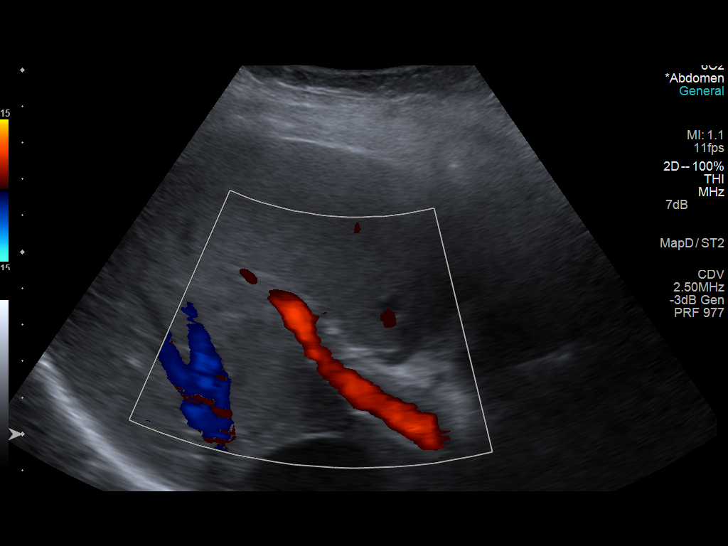

[14 of 25 positions shown; findings below may reference images not displayed]

FINDINGS: Gallbladder:

Well distended with diffuse gallbladder wall thickening and
gallbladder wall edema. No calculi are seen.

Common bile duct:

Diameter: 2.6 mm.

Liver:

Within normal limits.
IMPRESSION: Diffusely thickened gallbladder wall without evidence of
cholelithiasis. These changes would be consistent with acalculous
cholecystitis. Correlation with the physical exam is recommended.

## 2016-08-18 IMAGING — CT CT ANGIO CHEST
1 of 8 series · 17 of 36 positions shown · IV contrast (Iohexol (Omnipaque 350))
Comparison: Radiographs 08/03/2014.  Abdominal CT 08/03/2014.

CLINICAL DATA: Shortness of breath for 4 days. Evaluate for
pulmonary embolism. Initial encounter.

EXAM:
CT ANGIOGRAPHY CHEST WITH CONTRAST
TECHNIQUE: Multidetector CT imaging of the chest was performed using the
standard protocol during bolus administration of intravenous
contrast. Multiplanar CT image reconstructions and MIPs were
obtained to evaluate the vascular anatomy.
CONTRAST:  100mL OMNIPAQUE IOHEXOL 350 MG/ML SOLN

[Series 506: thins pacs · axial · 0.66mm/px · z∈[+469,+701]mm · 17 of 262 slices shown]
[im 15/262  lung]
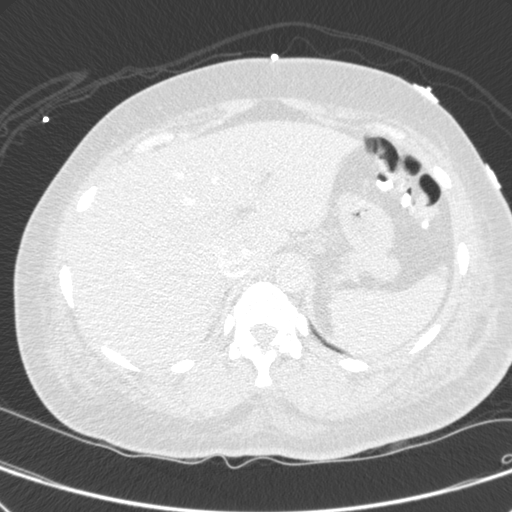
[im 30/262  mediastinal]
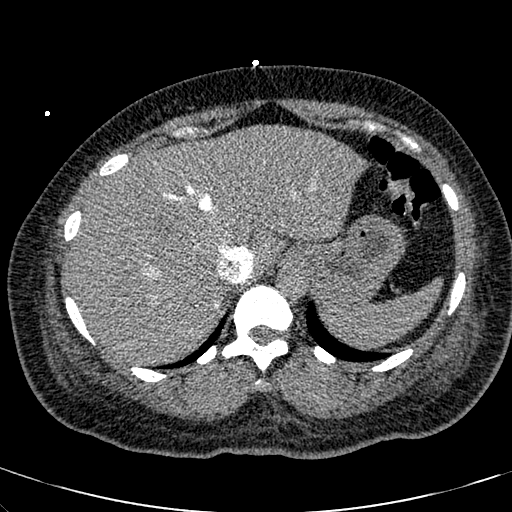
[im 44/262  lung]
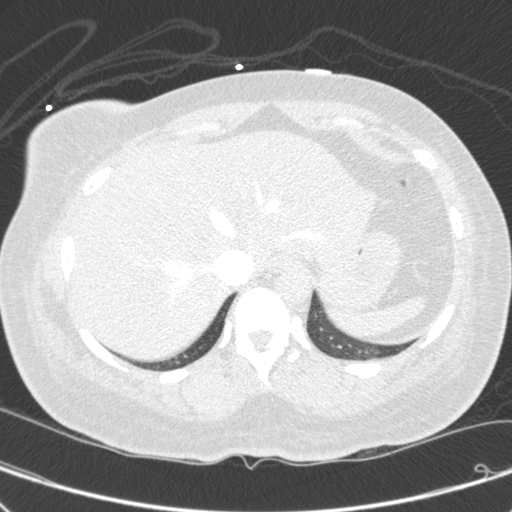
[im 59/262  mediastinal]
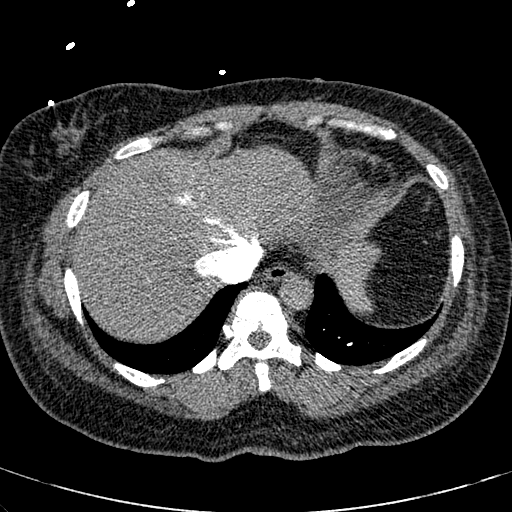
[im 73/262  lung]
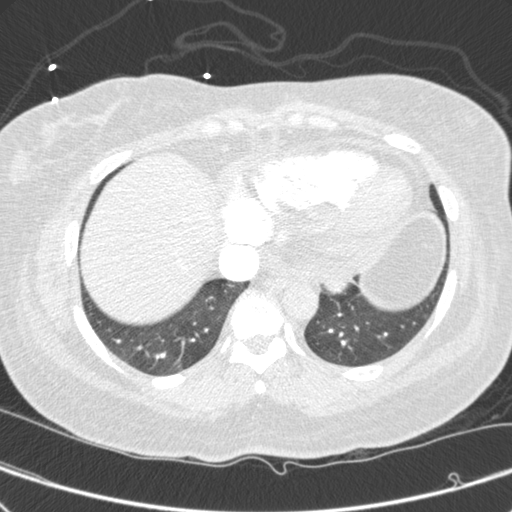
[im 88/262  mediastinal]
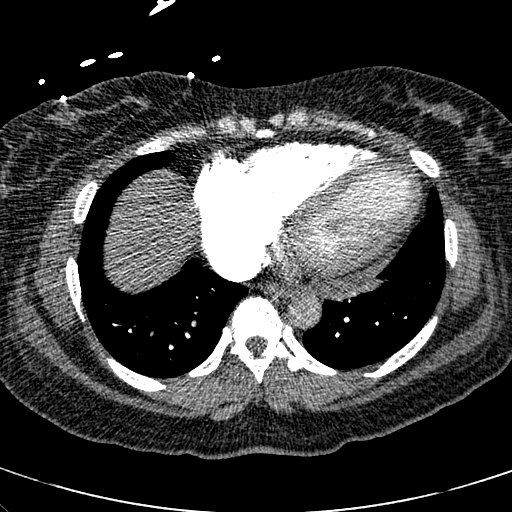
[im 102/262  lung]
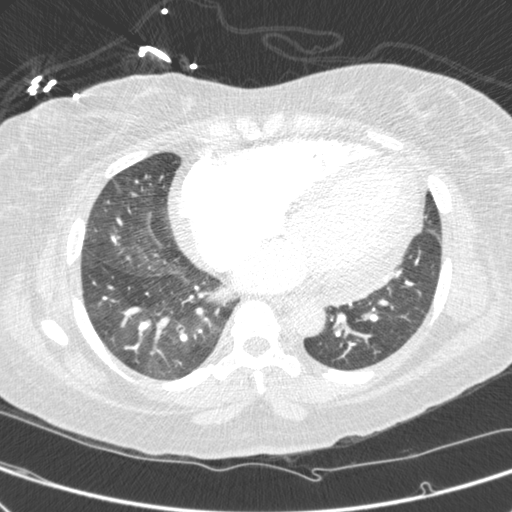
[im 117/262  mediastinal]
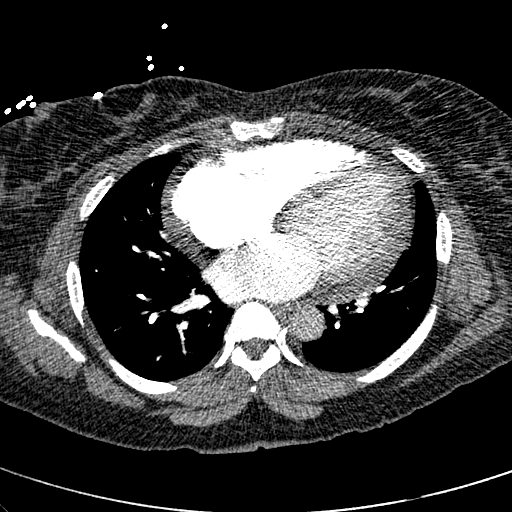
[im 131/262  lung]
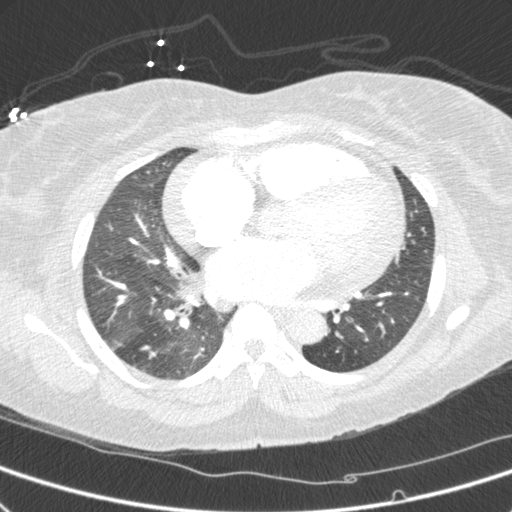
[im 146/262  mediastinal]
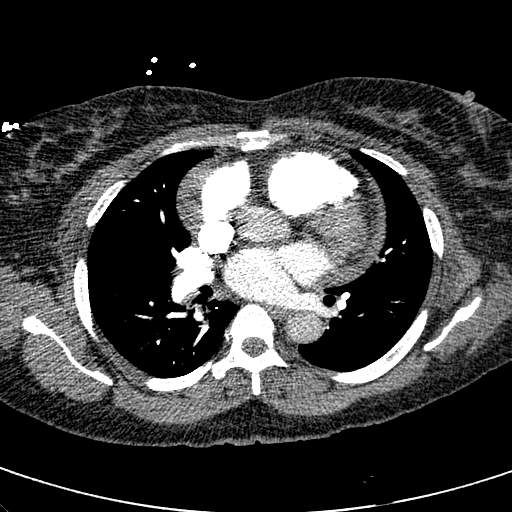
[im 160/262  lung]
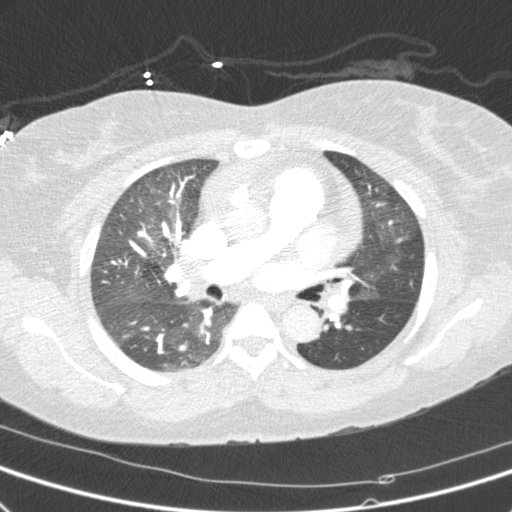
[im 175/262  mediastinal]
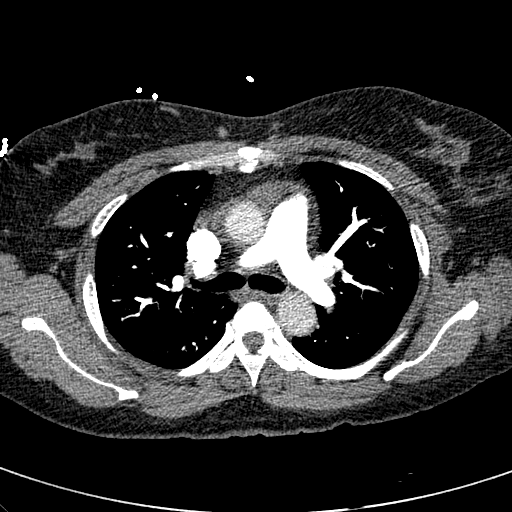
[im 189/262  lung]
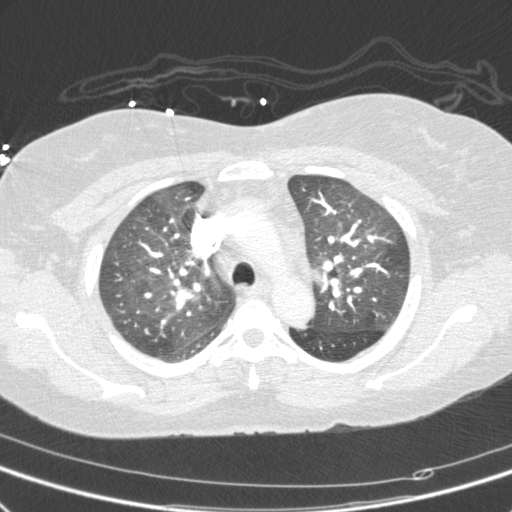
[im 204/262  mediastinal]
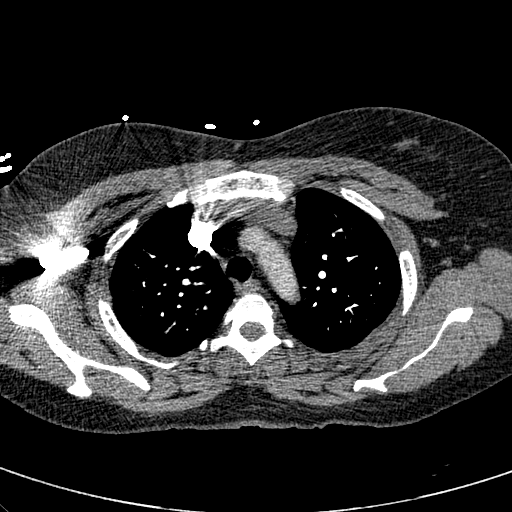
[im 218/262  lung]
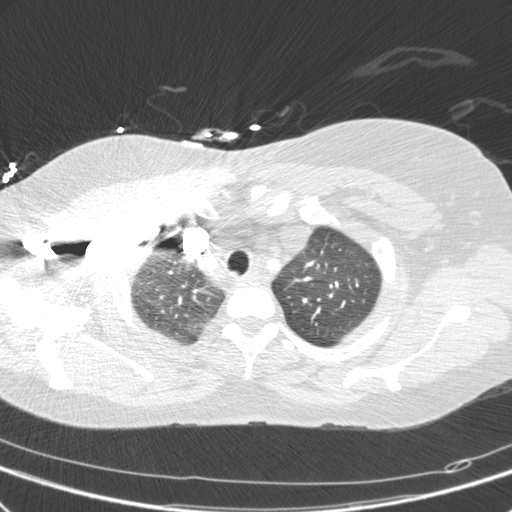
[im 233/262  mediastinal]
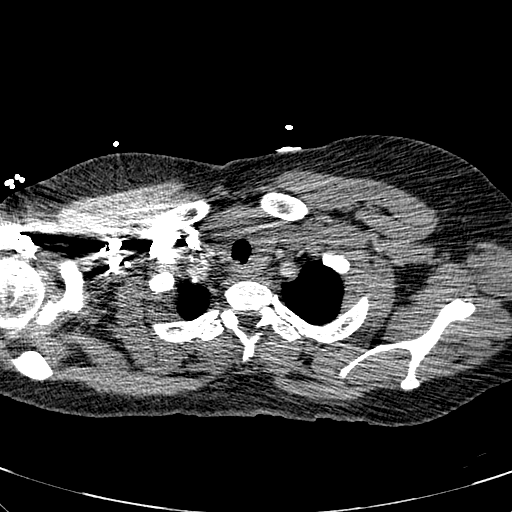
[im 247/262  lung]
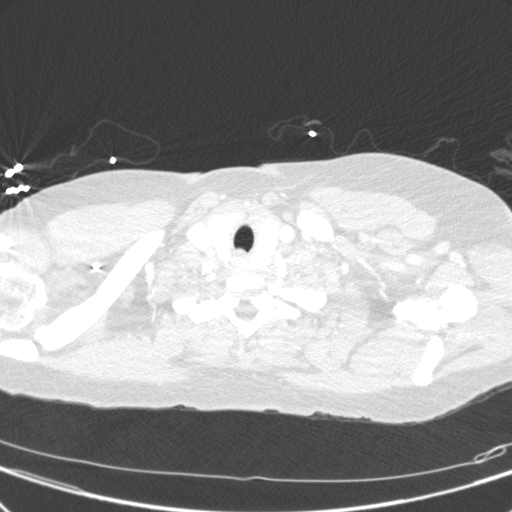

[17 of 36 positions shown; findings below may reference images not displayed]

FINDINGS: Mediastinum: The pulmonary arteries are well opacified with
contrast. There is no evidence of acute pulmonary embolism. There is
limited opacification of the aorta which demonstrates no definite
abnormality.Cardiomegaly and a small pericardial effusion are again
noted. There are no enlarged mediastinal, hilar or axillary lymph
nodes. The thyroid gland, trachea and esophagus demonstrate no
significant findings.

Lungs/Pleura: There is no pleural effusion.The lungs demonstrate
mosaic attenuation without focal airspace disease, suspicious nodule
or endobronchial lesion.

Upper abdomen: There is reflux of contrast into the IVC and hepatic
veins. No other significant findings.

Musculoskeletal/Chest wall: No chest wall lesion or acute osseous
findings.Mild generalized soft tissue edema.

Review of the MIP images confirms the above findings.
IMPRESSION: 1. No evidence of acute pulmonary embolism.
2. Cardiomegaly and small pericardial effusion are noted. There is
some reflux of contrast into the IVC and hepatic veins, again
suggesting right heart failure or insufficiency. The size of the
pericardial effusion does not suggest tamponade. Consider
echocardiography for further evaluation.
3. Mild generalized soft tissue edema.

## 2017-01-15 ENCOUNTER — Other Ambulatory Visit (HOSPITAL_COMMUNITY): Payer: Self-pay | Admitting: Internal Medicine

## 2017-01-20 ENCOUNTER — Other Ambulatory Visit (HOSPITAL_COMMUNITY): Payer: Self-pay | Admitting: Internal Medicine

## 2017-03-29 ENCOUNTER — Telehealth (HOSPITAL_COMMUNITY): Payer: Self-pay | Admitting: Vascular Surgery

## 2017-03-29 NOTE — Telephone Encounter (Signed)
Left pt message to make f/u appt w/ [facility] 

## 2017-03-31 ENCOUNTER — Telehealth (HOSPITAL_COMMUNITY): Payer: Self-pay | Admitting: Vascular Surgery

## 2017-03-31 NOTE — Telephone Encounter (Signed)
Returned pt call to make f.u appt 04/06/17, left pt message stating appt was made 2/12 @ 1:30 asked pt to call back to confirm appt

## 2017-04-06 ENCOUNTER — Ambulatory Visit (HOSPITAL_COMMUNITY)
Admission: RE | Admit: 2017-04-06 | Discharge: 2017-04-06 | Disposition: A | Discharge status: Home / Self Care | Payer: BLUE CROSS/BLUE SHIELD | Source: Ambulatory Visit | Attending: Cardiology | Admitting: Cardiology

## 2017-04-06 VITALS — BP 151/94 | HR 70 | Wt 171.0 lb

## 2017-04-06 DIAGNOSIS — Z79899 Other long term (current) drug therapy: Secondary | ICD-10-CM | POA: Diagnosis not present

## 2017-04-06 DIAGNOSIS — Z7982 Long term (current) use of aspirin: Secondary | ICD-10-CM | POA: Diagnosis not present

## 2017-04-06 DIAGNOSIS — I5022 Chronic systolic (congestive) heart failure: Secondary | ICD-10-CM | POA: Diagnosis not present

## 2017-04-06 DIAGNOSIS — I110 Hypertensive heart disease with heart failure: Secondary | ICD-10-CM | POA: Insufficient documentation

## 2017-04-06 DIAGNOSIS — I420 Dilated cardiomyopathy: Secondary | ICD-10-CM

## 2017-04-06 DIAGNOSIS — I2510 Atherosclerotic heart disease of native coronary artery without angina pectoris: Secondary | ICD-10-CM | POA: Insufficient documentation

## 2017-04-06 LAB — BASIC METABOLIC PANEL
Anion gap: 8 (ref 5–15)
BUN: 9 mg/dL (ref 6–20)
CALCIUM: 8.9 mg/dL (ref 8.9–10.3)
CO2: 21 mmol/L — AB (ref 22–32)
CREATININE: 0.96 mg/dL (ref 0.44–1.00)
Chloride: 109 mmol/L (ref 101–111)
Glucose, Bld: 83 mg/dL (ref 65–99)
Potassium: 4.2 mmol/L (ref 3.5–5.1)
SODIUM: 138 mmol/L (ref 135–145)

## 2017-04-06 MED ORDER — SACUBITRIL-VALSARTAN 49-51 MG PO TABS: 1.0000 | ORAL_TABLET | Freq: Two times a day (BID) | ORAL | 11 refills | Status: DC

## 2017-04-06 NOTE — Patient Instructions (Addendum)
Will schedule you for an echocardiogram at [facility]  [address] [place], [place] [address] [phone] Their office will call you to schedule.  Routine lab work today. Will notify you of abnormal results, otherwise no news is good news!  INCREASE Entresto to 49-51 mg twice daily. Rx sent to preferred pharmacy electronically. Use 30 day free and copay cards.  EKG today.  Follow up 1 year.  Take all medication as prescribed the day of your appointment. Bring all medications with you to your appointment.  Do the following things EVERYDAY: 1) Weigh yourself in the morning before breakfast. Write it down and keep it in a log. 2) Take your medicines as prescribed 3) Eat low salt foods-Limit salt (sodium) to 2000 mg per day.  4) Stay as active as you can everyday 5) Limit all fluids for the day to less than 2 liters

## 2017-04-06 NOTE — Progress Notes (Signed)
Patient ID: Pamela Brooks, female   DOB: 24-Nov-1970, 47 y.o.   MRN: [id number]    Advanced Heart Failure Clinic Note   PCP: [facility] Cardiology: Dr. Shirlee Latch  47 yo with history of HTN and chronic systolic CHF presents for CHF clinic evaluation.  She went to the ER in 6/16 with dyspnea and peripheral edema. No chest pain.  No prior viral-type illness.  She was found to be in CHF, echo with EF 25-30%.  LHC was done, showing nonobstructive stenosis in the circumflex.  She was diuresed and BP controlled.    Cardiac MRI in 1/17 showed EF improved to 55%. Admitted 1/23 -03/20/15 with hypotension and near syncope. It was discovered pt had continued her losartan on top of her Entresto. Meds helded and then resumed without losartan.  Spironolactone stopped with hypotension and improved EF. Discharge weight 167 lbs.  Today she returns for 1 year follow up. Overall feeling fine. Denies SOB/PND/Orthopnea. No CP. Appetite ok. No fever or chills. Weight at home 169-170 pounds. Taking all medications but ran out a few weeks ago. Continues to work full time in [place].    PMH: 1. HTN 2. Chronic systolic CHF: Nonischemic cardiomyopathy, ?due to myocarditis versus HTN.   - LHC (6/16) with 50% mLCx stenosis.  Echo (6/16) with EF 25-30%, moderately dilated LV, moderate MR.   - Echo (10/16) with EF 30%, moderate LV dilation.  - cMRI  03/19/15 LVEF 55%, normal RV, no LGE, so no definitive evidence for prior MI, myocarditis, or infiltrative disease. 3. CAD: LHC (6/16) with 50% mLCx stenosis.   SH: Lives with parents in [place].  Works in Science writer.  Non smoker, no ETOH.   FH: Father and mother with HTN.  No CHF or CAD that she knows of.    ROS: All systems reviewed and negative except as per HPI.   Current Outpatient Medications  Medication Sig Dispense Refill  . aspirin EC 81 MG EC tablet Take 1 tablet (81 mg total) by mouth daily. 30 tablet 0  . carvedilol (COREG) 12.5 MG tablet TAKE ONE  TABLET BY MOUTH TWICE DAILY WITH A MEAL 180 tablet 3  . sacubitril-valsartan (ENTRESTO) 24-26 MG Take 1 tablet by mouth 2 (two) times daily. Needs office visit 60 tablet 0   No current facility-administered medications for this encounter.    Vitals:   04/06/17 1328  BP: (!) 151/94  Pulse: 70  SpO2: 100%  Weight: 171 lb (77.6 kg)   Wt Readings from Last 3 Encounters:  04/06/17 171 lb (77.6 kg)  04/08/16 172 lb 8 oz (78.2 kg)  05/02/15 173 lb (78.5 kg)    General:  Well appearing. No resp difficulty. Walked in the clinic without difficulty HEENT: normal Neck: supple. no JVD. Carotids 2+ bilat; no bruits. No lymphadenopathy or thryomegaly appreciated. Cor: PMI nondisplaced. Regular rate & rhythm. No rubs, gallops or murmurs. Lungs: clear Abdomen: soft, nontender, nondistended. No hepatosplenomegaly. No bruits or masses. Good bowel sounds. Extremities: no cyanosis, clubbing, rash, edema Neuro: alert & orientedx3, cranial nerves grossly intact. moves all 4 extremities w/o difficulty. Affect pleasant  EKG: NSR 69 bpm QT/QTc 414/443 ms   Assessment/Plan: 1. Chronic systolic CHF: Nonischemic cardiomyopathy.  Cardiac MRI from 1/17 showed improvement in EF up to 55%.  Possible viral myocarditis with recovery.   NYHA I. Plan to repeat ECHO. If EF remains >55% will refer to [facility] in [place].  Continue coreg 12.5 mg twice a day.  Increase  entresto to 49-51 mg twice a day. Repeat BMET in 7-10 days.  - Repeat echo to ensure that EF has remained stable.  2. CAD: Moderate nonobstructive CAD on cath 2016 No S/S ischemia. .   - She is on ASA 81.   - Continue atorvastatin 20 mg daily. 3. HTN: Elevated. Increase entresto as above. .    Check BMET, ECHO. If EF remains >55% will refer to [facility] cardiology.   Will need follow in 1 year.   Amy Clegg NP-C  04/06/2017

## 2017-04-07 ENCOUNTER — Other Ambulatory Visit (HOSPITAL_COMMUNITY): Payer: Self-pay

## 2017-04-07 ENCOUNTER — Telehealth (HOSPITAL_COMMUNITY): Payer: Self-pay | Admitting: Vascular Surgery

## 2017-04-07 DIAGNOSIS — I5022 Chronic systolic (congestive) heart failure: Secondary | ICD-10-CM

## 2017-04-07 NOTE — Telephone Encounter (Signed)
Left pt message to giving echo appt 04/16/17 @ 2

## 2017-04-08 ENCOUNTER — Telehealth (HOSPITAL_COMMUNITY): Payer: Self-pay | Admitting: Pharmacist

## 2017-04-08 NOTE — Telephone Encounter (Signed)
Entresto PA approved by [employer] commercial through 02/22/38.   Tyler Deis. Bonnye Fava, PharmD, BCPS, CPP Clinical Pharmacist Phone: [phone] 04/08/2017 12:28 PM

## 2017-04-16 ENCOUNTER — Other Ambulatory Visit: Payer: BLUE CROSS/BLUE SHIELD

## 2017-04-22 ENCOUNTER — Emergency Department (HOSPITAL_COMMUNITY): Payer: BLUE CROSS/BLUE SHIELD

## 2017-04-22 ENCOUNTER — Emergency Department (HOSPITAL_COMMUNITY)
Admission: EM | Admit: 2017-04-22 | Discharge: 2017-04-22 | Disposition: A | Discharge status: Home / Self Care | Payer: BLUE CROSS/BLUE SHIELD | Attending: Emergency Medicine | Admitting: Emergency Medicine

## 2017-04-22 ENCOUNTER — Encounter (HOSPITAL_COMMUNITY): Payer: Self-pay | Admitting: Emergency Medicine

## 2017-04-22 ENCOUNTER — Other Ambulatory Visit: Payer: Self-pay

## 2017-04-22 DIAGNOSIS — Z5321 Procedure and treatment not carried out due to patient leaving prior to being seen by health care provider: Secondary | ICD-10-CM | POA: Diagnosis not present

## 2017-04-22 DIAGNOSIS — R079 Chest pain, unspecified: Secondary | ICD-10-CM | POA: Diagnosis not present

## 2017-04-22 DIAGNOSIS — R0602 Shortness of breath: Secondary | ICD-10-CM | POA: Insufficient documentation

## 2017-04-22 LAB — BRAIN NATRIURETIC PEPTIDE: B Natriuretic Peptide: 63.2 pg/mL (ref 0.0–100.0)

## 2017-04-22 LAB — BASIC METABOLIC PANEL WITH GFR
Anion gap: 8 (ref 5–15)
BUN: 8 mg/dL (ref 6–20)
CO2: 23 mmol/L (ref 22–32)
Calcium: 8.7 mg/dL — ABNORMAL LOW (ref 8.9–10.3)
Chloride: 108 mmol/L (ref 101–111)
Creatinine, Ser: 0.9 mg/dL (ref 0.44–1.00)
GFR calc Af Amer: >60 mL/min
GFR calc non Af Amer: >60 mL/min
Glucose, Bld: 82 mg/dL (ref 65–99)
Potassium: 4.1 mmol/L (ref 3.5–5.1)
Sodium: 139 mmol/L (ref 135–145)

## 2017-04-22 LAB — I-STAT TROPONIN, ED
Troponin i, poc: 0 ng/mL (ref 0.00–0.08)
Troponin i, poc: 0 ng/mL (ref 0.00–0.08)

## 2017-04-22 LAB — I-STAT BETA HCG BLOOD, ED (MC, WL, AP ONLY): I-stat hCG, quantitative: <5 m[IU]/mL (ref <5)

## 2017-04-22 LAB — CBC
HCT: 39 % (ref 36.0–46.0)
Hemoglobin: 12.6 g/dL (ref 12.0–15.0)
MCH: 26.5 pg (ref 26.0–34.0)
MCHC: 32.3 g/dL (ref 30.0–36.0)
MCV: 82.1 fL (ref 78.0–100.0)
Platelets: 256 K/uL (ref 150–400)
RBC: 4.75 MIL/uL (ref 3.87–5.11)
RDW: 14.4 % (ref 11.5–15.5)
WBC: 5.3 K/uL (ref 4.0–10.5)

## 2017-04-22 LAB — D-DIMER, QUANTITATIVE (NOT AT ARMC): D-Dimer, Quant: 0.47 ug/mL-FEU (ref 0.00–0.50)

## 2017-04-22 NOTE — ED Notes (Signed)
Patient verbalizes understanding of discharge instructions. Opportunity for questioning and answers were provided. Armband removed by staff, pt discharged from ED ambualtory.  

## 2017-04-22 NOTE — Discharge Instructions (Signed)
Follow-up with your cardiologist. return here as needed.

## 2017-04-22 NOTE — ED Notes (Signed)
Phlebotomy @ bedside  

## 2017-04-22 NOTE — ED Provider Notes (Signed)
Patient placed in Quick Look pathway, seen and evaluated   Chief Complaint: shortness of breath, chest pain  HPI:   47 y.o. female with hx of HTN, CHF here today with shortness of breath that is worse with lying flat x 1 day and chest pain. Patient denies fever or chills. She reports that she has "fluid pills" that she only takes when she feels like she needs them. She has not taken any since the symptoms started.   ROS: Resp: shortness of breath  Cardiac: chest pain  Physical Exam:  BP (!) 157/99 (BP Location: Right Arm)   Pulse 69   Temp 98.9 F (37.2 C) (Oral)   Resp 18   Ht 5\' 6"  (1.676 m)   Wt 77.1 kg (170 lb)   SpO2 100%   BMI 27.44 kg/m    Gen: No distress  Neuro: Awake and Alert  Skin: Warm and dry  Resp: decreased breath sounds, no rales,  rhonchi or wheezing     Focused Exam:    Initiation of care has begun. The patient has been counseled on the process, plan, and necessity for staying for the completion/evaluation, and the remainder of the medical screening examination    Janne Napoleon, NP 04/22/17 1528    Gerhard Munch, MD 04/24/17 [phone]

## 2017-04-22 NOTE — ED Triage Notes (Signed)
Pt c/o shortness of breath and chest pain x 1 day. Worse when lying flat. Denies fevers. LS diminished.

## 2017-04-29 NOTE — ED Provider Notes (Signed)
[facility]amela [facility] EMERGENCY DEPARTMENT Provider Note   CSN: [id number] Arrival date & time: 04/22/17  1512     History   Chief Complaint Chief Complaint  Patient presents with  . Chest Pain    HPI Pamela Brooks is a 47 y.o. female.  HPI Patient presents to the emergency department with chest pain with shortness of breath with been ongoing over the last 24 hours.  Patient states that she was concerned so she came to be evaluated.  The patient states that nothing seems to make the condition better she states that she did feel a little bit more shortness of breath when she lies back.  Patient states that she is currently not having any pain at this time.  Patient states that she only takes her Lasix as needed.  Patient states does not feel like similar to any previous episodes of chest pain.  Patient states this seems to be more sharp in nature.  The patient states that she has not taken any of her Lasix.  The patient denies headache,blurred vision, neck pain, fever, cough, weakness, numbness, dizziness, anorexia, edema, abdominal pain, nausea, vomiting, diarrhea, rash, back pain, dysuria, hematemesis, bloody stool, near syncope, or syncope. Past Medical History:  Diagnosis Date  . Asthma   . CHF (congestive heart failure) (HCC)   . GERD (gastroesophageal reflux disease)   . HTN (hypertension)   . NICM (nonischemic cardiomyopathy) [facility])     Patient Active Problem List   Diagnosis Date Noted  . Chest pain 03/18/2015  . Near syncope 03/18/2015  . Pain in the chest   . Chronic systolic CHF (congestive heart failure) (HCC) 12/25/2014  . CAD (coronary artery disease) 12/25/2014  . Congestive dilated cardiomyopathy (HCC)   . Non-ischemic cardiomyopathy (HCC)   . Acute CHF (congestive heart failure) (HCC) 08/04/2014  . SOB (shortness of breath) 08/04/2014  . HTN (hypertension) 08/04/2014  . CHF (congestive heart failure) (HCC) 08/04/2014  . Asthma     Past Surgical  History:  Procedure Laterality Date  . CARDIAC CATHETERIZATION N/A 08/06/2014   Procedure: Left Heart Cath and Coronary Angiography;  Surgeon: Marykay Lex, MD;  Location: [facility] INVASIVE CV LAB;  Service: Cardiovascular;  Laterality: N/A;    OB History    No data available       Home Medications    Prior to Admission medications   Medication Sig Start Date End Date Taking? Authorizing Provider  acetaminophen (TYLENOL) 500 MG tablet Take 500 mg by mouth every 6 (six) hours as needed for mild pain.   Yes [provider]  aspirin EC 81 MG EC tablet Take 1 tablet (81 mg total) by mouth daily. 08/07/14  Yes Clydia Llano, MD  carvedilol (COREG) 12.5 MG tablet TAKE ONE TABLET BY MOUTH TWICE DAILY WITH A MEAL 05/12/16  Yes Bensimhon, Bevelyn Buckles, MD  sacubitril-valsartan (ENTRESTO) 49-51 MG Take 1 tablet by mouth 2 (two) times daily. 04/06/17  Yes Clegg, Amy D, NP    Family History Family History  Problem Relation Age of Onset  . Hypertension Mother   . Hypertension Father   . Stroke Father   . Throat cancer Father   . Cancer Father     Social History Social History   Tobacco Use  . Smoking status: Never Smoker  . Smokeless tobacco: Never Used  Substance Use Topics  . Alcohol use: No    Alcohol/week: 0.0 oz  . Drug use: No     Allergies  Tramadol   Review of Systems Review of Systems All other systems negative except as documented in the HPI. All pertinent positives and negatives as reviewed in the HPI. Physical Exam Updated Vital Signs BP (!) 180/110 (BP Location: Left Arm)   Pulse 65   Temp 98.9 F (37.2 C) (Oral)   Resp 18   Ht 5\' 6"  (1.676 m)   Wt 77.1 kg (170 lb)   SpO2 100%   BMI 27.44 kg/m   Physical Exam   ED Treatments / Results  Labs (all labs ordered are listed, but only abnormal results are displayed) Labs Reviewed  BASIC METABOLIC PANEL - Abnormal; Notable for the following components:      Result Value   Calcium 8.7 (*)    All other  components within normal limits  CBC  BRAIN NATRIURETIC PEPTIDE  D-DIMER, QUANTITATIVE (NOT AT [facility])  I-STAT TROPONIN, ED  I-STAT BETA HCG BLOOD, ED ([facility], [facility], [facility] ONLY)  I-STAT TROPONIN, ED    EKG  EKG Interpretation  Date/Time:  Thursday April 22 2017 15:17:26 EST Ventricular Rate:  69 PR Interval:  156 QRS Duration: 86 QT Interval:  414 QTC Calculation: 443 R Axis:   56 Text Interpretation:  Normal sinus rhythm Normal ECG Confirmed by Tilden Fossa [phone]) on 04/22/2017 10:47:12 PM       Radiology No results found.  Procedures Procedures (including critical care time)  Medications Ordered in ED Medications - No data to display   Initial Impression / Assessment and Plan / ED Course  I have reviewed the triage vital signs and the nursing notes.  Pertinent labs & imaging results that were available during my care of the patient were reviewed by me and considered in my medical decision making (see chart for details).     Patient has been having constant chest discomfort over the last 24 hours.  She is not showing any signs of acute heart failure at this point.  We will have the patient follow-up with her cardiologist.  I have advised her to return here as needed.  Patient agrees to the plan and all questions were answered.  She states that she was really making sure that everything was okay as far as her CHF was concerned.  Final Clinical Impressions(s) / ED Diagnoses   Final diagnoses:  Nonspecific chest pain    ED Discharge Orders    None       Charlestine Night, PA-C 04/29/17 1324    Tilden Fossa, MD 05/05/17 1233

## 2017-05-18 ENCOUNTER — Encounter: Payer: Self-pay | Admitting: Adult Health

## 2017-06-05 ENCOUNTER — Other Ambulatory Visit (HOSPITAL_COMMUNITY): Payer: Self-pay | Admitting: Internal Medicine

## 2018-05-10 ENCOUNTER — Other Ambulatory Visit (HOSPITAL_COMMUNITY): Payer: Self-pay | Admitting: Adult Health

## 2018-06-28 ENCOUNTER — Other Ambulatory Visit: Payer: Self-pay | Admitting: Nurse Practitioner

## 2018-07-11 ENCOUNTER — Telehealth (HOSPITAL_COMMUNITY): Payer: Self-pay | Admitting: *Deleted

## 2018-07-11 ENCOUNTER — Other Ambulatory Visit (HOSPITAL_COMMUNITY): Payer: Self-pay | Admitting: *Deleted

## 2018-07-11 MED ORDER — FUROSEMIDE 20 MG PO TABS: 20.0000 mg | ORAL_TABLET | ORAL | 3 refills | Status: DC | PRN

## 2018-07-11 NOTE — Telephone Encounter (Signed)
Pt called stating she was prescribed lasix 20mg  as needed but I do not see it on her current medication list or last office note. Patient states she has a little swelling in ankles she thinks from being home from work and not being as active but would normally take one 20mg  lasix tablet.   Message routed to Amy Clegg,NP for refill approval

## 2018-07-11 NOTE — Telephone Encounter (Signed)
   She needs a visit.   You can send in a refill for lasix 20 mg as needed.   Send in 10 tablets.   Thanks Amy

## 2018-08-08 ENCOUNTER — Other Ambulatory Visit (HOSPITAL_COMMUNITY): Payer: Self-pay | Admitting: Internal Medicine

## 2018-10-17 ENCOUNTER — Other Ambulatory Visit (HOSPITAL_COMMUNITY): Payer: Self-pay

## 2018-10-17 MED ORDER — ENTRESTO 49-51 MG PO TABS: 1.0000 | ORAL_TABLET | Freq: Two times a day (BID) | ORAL | 0 refills | Status: DC

## 2018-10-24 ENCOUNTER — Other Ambulatory Visit (HOSPITAL_COMMUNITY): Payer: Self-pay

## 2018-10-24 MED ORDER — ENTRESTO 49-51 MG PO TABS: 1.0000 | ORAL_TABLET | Freq: Two times a day (BID) | ORAL | 0 refills | Status: DC

## 2019-01-09 ENCOUNTER — Other Ambulatory Visit (HOSPITAL_COMMUNITY): Payer: Self-pay | Admitting: *Deleted

## 2019-01-09 MED ORDER — ENTRESTO 49-51 MG PO TABS: 1.0000 | ORAL_TABLET | Freq: Two times a day (BID) | ORAL | 0 refills | Status: DC

## 2019-01-17 ENCOUNTER — Telehealth (HOSPITAL_COMMUNITY): Payer: Self-pay

## 2019-01-17 NOTE — Telephone Encounter (Signed)
Attempted to call Patient for Pre Covid Screening. Patient answered the phone the first time and the call was lost and when I called back I was sent to the Voicemail.

## 2019-01-18 ENCOUNTER — Encounter (HOSPITAL_COMMUNITY): Payer: BLUE CROSS/BLUE SHIELD

## 2019-03-06 ENCOUNTER — Other Ambulatory Visit (HOSPITAL_COMMUNITY): Payer: Self-pay

## 2019-03-06 MED ORDER — ENTRESTO 49-51 MG PO TABS: 1.0000 | ORAL_TABLET | Freq: Two times a day (BID) | ORAL | 0 refills | Status: DC

## 2019-03-20 ENCOUNTER — Ambulatory Visit (HOSPITAL_COMMUNITY)
Admission: RE | Admit: 2019-03-20 | Discharge: 2019-03-20 | Disposition: A | Discharge status: Home / Self Care | Payer: BC Managed Care – PPO | Source: Ambulatory Visit | Attending: Adult Health | Admitting: Adult Health

## 2019-03-20 ENCOUNTER — Other Ambulatory Visit: Payer: Self-pay

## 2019-03-20 ENCOUNTER — Encounter (HOSPITAL_COMMUNITY): Payer: Self-pay

## 2019-03-20 VITALS — BP 174/118 | HR 99 | Wt 179.8 lb

## 2019-03-20 DIAGNOSIS — I428 Other cardiomyopathies: Secondary | ICD-10-CM | POA: Diagnosis not present

## 2019-03-20 DIAGNOSIS — I5022 Chronic systolic (congestive) heart failure: Secondary | ICD-10-CM | POA: Insufficient documentation

## 2019-03-20 DIAGNOSIS — Z7982 Long term (current) use of aspirin: Secondary | ICD-10-CM | POA: Diagnosis not present

## 2019-03-20 DIAGNOSIS — I2510 Atherosclerotic heart disease of native coronary artery without angina pectoris: Secondary | ICD-10-CM | POA: Diagnosis not present

## 2019-03-20 DIAGNOSIS — I110 Hypertensive heart disease with heart failure: Secondary | ICD-10-CM | POA: Insufficient documentation

## 2019-03-20 DIAGNOSIS — Z79899 Other long term (current) drug therapy: Secondary | ICD-10-CM | POA: Insufficient documentation

## 2019-03-20 DIAGNOSIS — I10 Essential (primary) hypertension: Secondary | ICD-10-CM

## 2019-03-20 DIAGNOSIS — Z8249 Family history of ischemic heart disease and other diseases of the circulatory system: Secondary | ICD-10-CM | POA: Insufficient documentation

## 2019-03-20 LAB — BASIC METABOLIC PANEL
Anion gap: 8 (ref 5–15)
BUN: 13 mg/dL (ref 6–20)
CO2: 25 mmol/L (ref 22–32)
Calcium: 8.7 mg/dL — ABNORMAL LOW (ref 8.9–10.3)
Chloride: 110 mmol/L (ref 98–111)
Creatinine, Ser: 1.12 mg/dL — ABNORMAL HIGH (ref 0.44–1.00)
GFR calc Af Amer: >60 mL/min (ref >60)
GFR calc non Af Amer: 57 mL/min — ABNORMAL LOW (ref >60)
Glucose, Bld: 100 mg/dL — ABNORMAL HIGH (ref 70–99)
Potassium: 3.7 mmol/L (ref 3.5–5.1)
Sodium: 143 mmol/L (ref 135–145)

## 2019-03-20 LAB — LIPID PANEL
Cholesterol: 138 mg/dL (ref 0–200)
HDL: 40 mg/dL — ABNORMAL LOW (ref >40)
LDL Cholesterol: 86 mg/dL (ref 0–99)
Total CHOL/HDL Ratio: 3.5 RATIO
Triglycerides: 62 mg/dL (ref <150)
VLDL: 12 mg/dL (ref 0–40)

## 2019-03-20 MED ORDER — ENTRESTO 97-103 MG PO TABS: 1.0000 | ORAL_TABLET | Freq: Two times a day (BID) | ORAL | 11 refills | Status: DC

## 2019-03-20 MED ORDER — CARVEDILOL 12.5 MG PO TABS: 18.7500 mg | ORAL_TABLET | Freq: Two times a day (BID) | ORAL | 3 refills | Status: DC

## 2019-03-20 MED ORDER — FUROSEMIDE 20 MG PO TABS: 20.0000 mg | ORAL_TABLET | ORAL | 3 refills | Status: DC

## 2019-03-20 NOTE — Progress Notes (Signed)
Patient ID: Pamela Brooks, female   DOB: [birth year], 49 y.o.   MRN: [id number]    Advanced [facility] Failure Clinic Note   PCP: None  HF Cardiology: Dr. Shirlee Latch  Ms Ressler is a 49 year with history of HTN and chronic systolic CHF presents for CHF clinic evaluation.  She went to the ER in 6/16 with dyspnea and peripheral edema. No chest pain.  No prior viral-type illness.  She was found to be in CHF, echo with EF 25-30%.  LHC was done, showing nonobstructive stenosis in the circumflex.  She was diuresed and BP controlled.    Cardiac MRI in 1/17 showed EF improved to 55%. Admitted 1/23 -03/20/15 with hypotension and near syncope. It was discovered pt had continued her losartan on top of her Entresto. Meds helded and then resumed without losartan.  Spironolactone stopped with hypotension and improved EF. Discharge weight 167 lbs.  Today she returns for HF follow up. She has not been seen in a few years. Overall feeling fine. Says she has recently been taking lasix 3-4 times a week. Denies SOB/PND/Orthopnea. No chest pain. Appetite ok. No fever or chills. She has not weighing at home. Weight at home  pounds. Taking all medications. Working full time at [employer].   PMH: 1. HTN 2. Chronic systolic CHF: Nonischemic cardiomyopathy, ?due to myocarditis versus HTN.   - LHC (6/16) with 50% mLCx stenosis.  Echo (6/16) with EF 25-30%, moderately dilated LV, moderate MR.   - Echo (10/16) with EF 30%, moderate LV dilation.  - cMRI  03/19/15 LVEF 55%, normal RV, no LGE, so no definitive evidence for prior MI, myocarditis, or infiltrative disease. 3. CAD: LHC (6/16) with 50% mLCx stenosis.   SH: Lives with parents in [place].  Works in Science [employer].  Non smoker, no ETOH.   FH: Father and mother with HTN.  No CHF or CAD that she knows of.    ROS: All systems reviewed and negative except as per HPI.   Current Outpatient Medications  Medication Sig Dispense Refill  . acetaminophen (TYLENOL) 500 MG tablet Take  500 mg by mouth every 6 (six) hours as needed for mild pain.    Marland Kitchen aspirin EC 81 MG EC tablet Take 1 tablet (81 mg total) by mouth daily. 30 tablet 0  . carvedilol (COREG) 12.5 MG tablet TAKE 1 TABLET BY MOUTH TWICE DAILY WITH MEALS 180 tablet 0  . sacubitril-valsartan (ENTRESTO) 49-51 MG Take 1 tablet by mouth 2 (two) times daily. 60 tablet 0  . furosemide (LASIX) 20 MG tablet Take 1 tablet (20 mg total) by mouth as needed for fluid or edema. (Patient not taking: Reported on 03/20/2019) 10 tablet 3   No current facility-administered medications for this encounter.   Vitals:   03/20/19 1456  BP: (!) 174/118  Pulse: 99  SpO2: 99%  Weight: 81.6 kg (179 lb 12.8 oz)   Wt Readings from Last 3 Encounters:  03/20/19 81.6 kg (179 lb 12.8 oz)  04/22/17 77.1 kg (170 lb)  04/06/17 77.6 kg (171 lb)    General:  Well appearing. No resp difficulty HEENT: normal Neck: supple. no JVD. Carotids 2+ bilat; no bruits. No lymphadenopathy or thryomegaly appreciated. Cor: PMI nondisplaced. Regular rate & rhythm. No rubs, gallops or murmurs. Lungs: clear Abdomen: soft, nontender, nondistended. No hepatosplenomegaly. No bruits or masses. Good bowel sounds. Extremities: no cyanosis, clubbing, rash, edema Neuro: alert & orientedx3, cranial nerves grossly intact. moves all 4 extremities w/o difficulty. Affect pleasant  Assessment/Plan: 1. Chronic systolic CHF: Nonischemic cardiomyopathy.  Cardiac MRI from 1/17 showed improvement in EF up to 55%.  Possible viral myocarditis with recovery.   NYHA II.  - Volume status mildly elevated. Start lasix 20 mg every other day.  - Increase coreg to 18.75 mg twice a day.  - Increase entresto 97-103 mg twice a day.  - Check BMET today and in 10-14 days.  -2. CAD: Moderate nonobstructive CAD on cath 2016 No S/S ischemia.  - Check lipids. Needs to be back on a statin.  - Continue ASA 81.   3. HTN:  Uncontrolled. Increasing entresto and carvedilol as noted above.    Follow up in 2 weeks. Repeat ECHO.  Greater than 50% of the (total minutes 25) visit spent in counseling/coordination of care regarding medication changes and the purpose test.     Melquisedec Journey NP-C  03/20/2019

## 2019-03-20 NOTE — Patient Instructions (Signed)
INCREASE Coreg to 18.75 mg, (one and one half tab) twice daily INCREASE Entresto to 97/103 mg, one tab twice daily INCREASE Lasix to 20 mg, one tab every other day  Labs today We will only contact you if something comes back abnormal or we need to make some changes. Otherwise no news is good news!  Your physician recommends that you schedule a follow-up appointment in: 2 weeks with Amy Clegg,NP and echo  Your physician has requested that you have an echocardiogram. Echocardiography is a painless test that uses sound waves to create images of your heart. It provides your doctor with information about the size and shape of your heart and how well your heart's chambers and valves are working. This procedure takes approximately one hour. There are no restrictions for this procedure.  Do the following things EVERYDAY: 1) Weigh yourself in the morning before breakfast. Write it down and keep it in a log. 2) Take your medicines as prescribed 3) Eat low salt foods-Limit salt (sodium) to 2000 mg per day.  4) Stay as active as you can everyday 5) Limit all fluids for the day to less than 2 liters  At the [facility], you and your health needs are our priority. As part of our continuing mission to provide you with exceptional heart care, we have created designated Provider Care Teams. These Care Teams include your primary Cardiologist (physician) and Advanced Practice Providers (APPs- Physician Assistants and Nurse Practitioners) who all work together to provide you with the care you need, when you need it.   You may see any of the following providers on your designated Care Team at your next follow up: Marland Kitchen Dr Arvilla Meres . Dr Marca Ancona . Tonye Becket, NP . Robbie Lis, PA . Karle Plumber, PharmD   Please be sure to bring in all your medications bottles to every appointment.

## 2019-03-23 ENCOUNTER — Telehealth (HOSPITAL_COMMUNITY): Payer: Self-pay

## 2019-03-23 MED ORDER — ATORVASTATIN CALCIUM 40 MG PO TABS: 40.0000 mg | ORAL_TABLET | Freq: Every day | ORAL | 5 refills | Status: DC

## 2019-03-23 NOTE — Telephone Encounter (Signed)
Pt aware of results. Script for atorvastatin 40mg  daily sent to pharmacy. Verbalized understanding.

## 2019-03-23 NOTE — Telephone Encounter (Signed)
-----   Message from Sherald Hess, NP sent at 03/23/2019  1:08 PM EST ----- Start atorvastatin 40 mg daily.

## 2019-04-03 ENCOUNTER — Ambulatory Visit (HOSPITAL_BASED_OUTPATIENT_CLINIC_OR_DEPARTMENT_OTHER)
Admission: RE | Admit: 2019-04-03 | Discharge: 2019-04-03 | Disposition: A | Discharge status: Home / Self Care | Payer: BC Managed Care – PPO | Source: Ambulatory Visit | Attending: Adult Health | Admitting: Adult Health

## 2019-04-03 ENCOUNTER — Ambulatory Visit (HOSPITAL_COMMUNITY)
Admission: RE | Admit: 2019-04-03 | Discharge: 2019-04-03 | Disposition: A | Discharge status: Home / Self Care | Payer: BC Managed Care – PPO | Source: Ambulatory Visit | Attending: Adult Health | Admitting: Adult Health

## 2019-04-03 ENCOUNTER — Encounter (HOSPITAL_COMMUNITY): Payer: Self-pay

## 2019-04-03 ENCOUNTER — Other Ambulatory Visit: Payer: Self-pay

## 2019-04-03 VITALS — BP 152/110 | HR 79 | Wt 176.2 lb

## 2019-04-03 DIAGNOSIS — I5022 Chronic systolic (congestive) heart failure: Secondary | ICD-10-CM | POA: Insufficient documentation

## 2019-04-03 DIAGNOSIS — I110 Hypertensive heart disease with heart failure: Secondary | ICD-10-CM | POA: Diagnosis present

## 2019-04-03 DIAGNOSIS — I2510 Atherosclerotic heart disease of native coronary artery without angina pectoris: Secondary | ICD-10-CM

## 2019-04-03 DIAGNOSIS — Z8249 Family history of ischemic heart disease and other diseases of the circulatory system: Secondary | ICD-10-CM | POA: Diagnosis not present

## 2019-04-03 DIAGNOSIS — I10 Essential (primary) hypertension: Secondary | ICD-10-CM

## 2019-04-03 DIAGNOSIS — I371 Nonrheumatic pulmonary valve insufficiency: Secondary | ICD-10-CM | POA: Insufficient documentation

## 2019-04-03 DIAGNOSIS — Z79899 Other long term (current) drug therapy: Secondary | ICD-10-CM | POA: Insufficient documentation

## 2019-04-03 DIAGNOSIS — I428 Other cardiomyopathies: Secondary | ICD-10-CM | POA: Diagnosis not present

## 2019-04-03 DIAGNOSIS — Z7982 Long term (current) use of aspirin: Secondary | ICD-10-CM | POA: Insufficient documentation

## 2019-04-03 DIAGNOSIS — I050 Rheumatic mitral stenosis: Secondary | ICD-10-CM | POA: Insufficient documentation

## 2019-04-03 LAB — BASIC METABOLIC PANEL
Anion gap: 14 (ref 5–15)
BUN: 13 mg/dL (ref 6–20)
CO2: 18 mmol/L — ABNORMAL LOW (ref 22–32)
Calcium: 8.6 mg/dL — ABNORMAL LOW (ref 8.9–10.3)
Chloride: 109 mmol/L (ref 98–111)
Creatinine, Ser: 1.06 mg/dL — ABNORMAL HIGH (ref 0.44–1.00)
GFR calc Af Amer: >60 mL/min (ref >60)
GFR calc non Af Amer: >60 mL/min (ref >60)
Glucose, Bld: 103 mg/dL — ABNORMAL HIGH (ref 70–99)
Potassium: 4 mmol/L (ref 3.5–5.1)
Sodium: 141 mmol/L (ref 135–145)

## 2019-04-03 MED ORDER — DAPAGLIFLOZIN PROPANEDIOL 10 MG PO TABS: 10.0000 mg | ORAL_TABLET | Freq: Every day | ORAL | 11 refills | Status: DC

## 2019-04-03 NOTE — Patient Instructions (Signed)
Lab work done today. We will notify you of any abnormal lab work. No news is good news!  START Farxiga 10mg  tab daily.  Please follow up with the [facility] in 2 weeks.  At the [facility], you and your health needs are our priority. As part of our continuing mission to provide you with exceptional heart care, we have created designated Provider Care Teams. These Care Teams include your primary Cardiologist (physician) and Advanced Practice Providers (APPs- Physician Assistants and Nurse Practitioners) who all work together to provide you with the care you need, when you need it.   You may see any of the following providers on your designated Care Team at your next follow up: Dr Marland Kitchen . Dr Arvilla Meres . Marca Ancona, NP . Tonye Becket, PA . Robbie Lis, PharmD   Please be sure to bring in all your medications bottles to every appointment.

## 2019-04-03 NOTE — Progress Notes (Signed)
Patient ID: Pamela Brooks, female   DOB: February 09, 1971, 49 y.o.   MRN: [id number]    Advanced Heart Failure Clinic Note   PCP: None  HF Cardiology: Dr. Shirlee Latch  Pamela Brooks is a 49 year with history of HTN and chronic systolic CHF presents for CHF clinic evaluation.  She went to the ER in 6/16 with dyspnea and peripheral edema. No chest pain.  No prior viral-type illness.  She was found to be in CHF, echo with EF 25-30%.  LHC was done, showing nonobstructive stenosis in the circumflex.  She was diuresed and BP controlled.    Cardiac MRI in 1/17 showed EF improved to 55%. Admitted 1/23 -03/20/15 with hypotension and near syncope. It was discovered pt had continued her losartan on top of her Entresto. Meds helded and then resumed without losartan.  Spironolactone stopped with hypotension and improved EF. Discharge weight 167 lbs.  Today she returns for HF follow up. She was seen 2 weeks ago and at that time carvedilol was increased to 18.75 mg twice and entresto was increased to 97-103 bm twice a day. Overall feeling fine. Mild SOB with steps. Denies PND/Orthopnea. Appetite ok. No fever or chills. Not weighing on a regular . Taking all medications. Working full time at [employer].   PMH: 1. HTN 2. Chronic systolic CHF: Nonischemic cardiomyopathy, ?due to myocarditis versus HTN.   - LHC (6/16) with 50% mLCx stenosis.  Echo (6/16) with EF 25-30%, moderately dilated LV, moderate MR.   - Echo (10/16) with EF 30%, moderate LV dilation.  - cMRI  03/19/15 LVEF 55%, normal RV, no LGE, so no definitive evidence for prior MI, myocarditis, or infiltrative disease. 3. CAD: LHC (6/16) with 50% mLCx stenosis.   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  Works in Science writer.  Non smoker, no ETOH.   FH: Father and mother with HTN.  No CHF or CAD that she knows of.    ROS: All systems reviewed and negative except as per HPI.   Current Outpatient Medications  Medication Sig Dispense Refill  . acetaminophen (TYLENOL) 500  MG tablet Take 500 mg by mouth every 6 (six) hours as needed for mild pain.    Marland Kitchen aspirin EC 81 MG EC tablet Take 1 tablet (81 mg total) by mouth daily. 30 tablet 0  . atorvastatin (LIPITOR) 40 MG tablet Take 1 tablet (40 mg total) by mouth daily. 30 tablet 5  . carvedilol (COREG) 12.5 MG tablet Take 1.5 tablets (18.75 mg total) by mouth 2 (two) times daily with a meal. 90 tablet 3  . furosemide (LASIX) 20 MG tablet Take 1 tablet (20 mg total) by mouth every other day. 45 tablet 3  . sacubitril-valsartan (ENTRESTO) 97-103 MG Take 1 tablet by mouth 2 (two) times daily. 60 tablet 11   No current facility-administered medications for this encounter.   Vitals:   04/03/19 1517  BP: (!) 152/110  Pulse: 79  SpO2: 99%  Weight: 79.9 kg (176 lb 3.2 oz)   Wt Readings from Last 3 Encounters:  04/03/19 79.9 kg (176 lb 3.2 oz)  03/20/19 81.6 kg (179 lb 12.8 oz)  04/22/17 77.1 kg (170 lb)    General:  Well appearing. No resp difficulty HEENT: normal Neck: supple. JVP 7-8 . Carotids 2+ bilat; no bruits. No lymphadenopathy or thryomegaly appreciated. Cor: PMI nondisplaced. Regular rate & rhythm. No rubs, gallops or murmurs. Lungs: clear Abdomen: soft, nontender, nondistended. No hepatosplenomegaly. No bruits or masses. Good bowel sounds.  Extremities: no cyanosis, clubbing, rash, edema Neuro: alert & orientedx3, cranial nerves grossly intact. moves all 4 extremities w/o difficulty. Affect pleasant  Assessment/Plan: 1. Chronic systolic CHF: Nonischemic cardiomyopathy.  Cardiac MRI from 1/17 showed improvement in EF up to 55%.  Possible viral myocarditis with recovery.  Uncontrolled HTN  - ECHO concerning with reduced EF < 30% mod-severe MR . Dr Aundra Dubin reviewed.   - NYHA II-III. Volume status mild elevated.  - Continue  lasix 20 mg every other day.  -Continue coreg to 18.75 mg twice a day.  - Continue entresto 97-103 mg twice a day.  - Add farxiga  10 mg daily. She was given 30 day free care.  -2.  CAD: Moderate nonobstructive CAD on cath 2016 No S/S ischemia.  - Continue ASA 81.   3. HTN:  Uncontrolled. Continue current dose of carvedilol and entresto. Add farxiga.   Follow up in 2 weeks. Check BMET.   Pamela Carrier NP-C  04/03/2019

## 2019-04-03 NOTE — Progress Notes (Signed)
  Echocardiogram 2D Echocardiogram has been performed.  Pamela Brooks 04/03/2019, 3:13 PM

## 2019-04-17 ENCOUNTER — Encounter (HOSPITAL_COMMUNITY): Payer: Self-pay

## 2019-04-17 ENCOUNTER — Other Ambulatory Visit: Payer: Self-pay

## 2019-04-17 ENCOUNTER — Ambulatory Visit (HOSPITAL_COMMUNITY)
Admission: RE | Admit: 2019-04-17 | Discharge: 2019-04-17 | Disposition: A | Discharge status: Home / Self Care | Payer: BC Managed Care – PPO | Source: Ambulatory Visit | Attending: Cardiology | Admitting: Cardiology

## 2019-04-17 VITALS — BP 146/92 | HR 73 | Wt 176.2 lb

## 2019-04-17 DIAGNOSIS — Z7982 Long term (current) use of aspirin: Secondary | ICD-10-CM | POA: Diagnosis not present

## 2019-04-17 DIAGNOSIS — I050 Rheumatic mitral stenosis: Secondary | ICD-10-CM | POA: Diagnosis not present

## 2019-04-17 DIAGNOSIS — I110 Hypertensive heart disease with heart failure: Secondary | ICD-10-CM | POA: Insufficient documentation

## 2019-04-17 DIAGNOSIS — I10 Essential (primary) hypertension: Secondary | ICD-10-CM | POA: Diagnosis not present

## 2019-04-17 DIAGNOSIS — Z7984 Long term (current) use of oral hypoglycemic drugs: Secondary | ICD-10-CM | POA: Diagnosis not present

## 2019-04-17 DIAGNOSIS — I428 Other cardiomyopathies: Secondary | ICD-10-CM | POA: Diagnosis not present

## 2019-04-17 DIAGNOSIS — I2510 Atherosclerotic heart disease of native coronary artery without angina pectoris: Secondary | ICD-10-CM | POA: Diagnosis not present

## 2019-04-17 DIAGNOSIS — Z79899 Other long term (current) drug therapy: Secondary | ICD-10-CM | POA: Insufficient documentation

## 2019-04-17 DIAGNOSIS — I5022 Chronic systolic (congestive) heart failure: Secondary | ICD-10-CM | POA: Diagnosis present

## 2019-04-17 DIAGNOSIS — Z8249 Family history of ischemic heart disease and other diseases of the circulatory system: Secondary | ICD-10-CM | POA: Diagnosis not present

## 2019-04-17 LAB — BASIC METABOLIC PANEL
Anion gap: 11 (ref 5–15)
BUN: 10 mg/dL (ref 6–20)
CO2: 22 mmol/L (ref 22–32)
Calcium: 8.7 mg/dL — ABNORMAL LOW (ref 8.9–10.3)
Chloride: 107 mmol/L (ref 98–111)
Creatinine, Ser: 1.1 mg/dL — ABNORMAL HIGH (ref 0.44–1.00)
GFR calc Af Amer: >60 mL/min (ref >60)
GFR calc non Af Amer: 58 mL/min — ABNORMAL LOW (ref >60)
Glucose, Bld: 143 mg/dL — ABNORMAL HIGH (ref 70–99)
Potassium: 3.7 mmol/L (ref 3.5–5.1)
Sodium: 140 mmol/L (ref 135–145)

## 2019-04-17 MED ORDER — CARVEDILOL 25 MG PO TABS: 25.0000 mg | ORAL_TABLET | Freq: Two times a day (BID) | ORAL | 3 refills | Status: DC

## 2019-04-17 MED ORDER — SPIRONOLACTONE 25 MG PO TABS: 12.5000 mg | ORAL_TABLET | Freq: Every day | ORAL | 3 refills | Status: DC

## 2019-04-17 NOTE — Progress Notes (Signed)
Patient ID: Pamela Brooks, female   DOB: Nov 13, 1970, 49 y.o.   MRN: [id number]    Advanced Heart Failure Clinic Note   PCP: None  HF Cardiology: Dr. Shirlee Latch  Pamela Brooks is a 48 year with history of HTN and chronic systolic CHF presents for CHF clinic evaluation.  She went to the ER in 6/16 with dyspnea and peripheral edema. No chest pain.  No prior viral-type illness.  She was found to be in CHF, echo with EF 25-30%.  LHC was done, showing nonobstructive stenosis in the circumflex.  She was diuresed and BP controlled.    Cardiac MRI in 1/17 showed EF improved to 55%. Admitted 1/23 -03/20/15 with hypotension and near syncope. It was discovered pt had continued her losartan on top of her Entresto. Meds helded and then resumed without losartan.  Spironolactone stopped with hypotension and improved EF. Discharge weight 167 lbs.  Today she returns for HF follow up. Last visit farxiga was started. Overall feeling much better. Denies dizziness. Denies SOB/PND/Orthopnea. Appetite ok. No fever or chills. Weight at home 173 pounds. Taking all medications.  Working full time at [employer].   PMH: 1. HTN 2. Chronic systolic CHF: Nonischemic cardiomyopathy, ?due to myocarditis versus HTN.   - LHC (6/16) with 50% mLCx stenosis.  Echo (6/16) with EF 25-30%, moderately dilated LV, moderate MR.   - Echo (10/16) with EF 30%, moderate LV dilation.  - cMRI  03/19/15 LVEF 55%, normal RV, no LGE, so no definitive evidence for prior MI, myocarditis, or infiltrative disease. - Echo 03/2019 EF 35-40%, mitral valve restricted posterior leaflet, moderate mitral stenosis 3. CAD: LHC (6/16) with 50% mLCx stenosis.   SH: Lives with parents in [place].  Works in Science writer.  Non smoker, no ETOH.   FH: Father and mother with HTN.  No CHF or CAD that she knows of.    ROS: All systems reviewed and negative except as per HPI.   Current Outpatient Medications  Medication Sig Dispense Refill  . acetaminophen (TYLENOL) 500 MG  tablet Take 500 mg by mouth every 6 (six) hours as needed for mild pain.    Marland Kitchen aspirin EC 81 MG EC tablet Take 1 tablet (81 mg total) by mouth daily. 30 tablet 0  . atorvastatin (LIPITOR) 40 MG tablet Take 1 tablet (40 mg total) by mouth daily. 30 tablet 5  . carvedilol (COREG) 12.5 MG tablet Take 1.5 tablets (18.75 mg total) by mouth 2 (two) times daily with a meal. 90 tablet 3  . dapagliflozin propanediol (FARXIGA) 10 MG TABS tablet Take 10 mg by mouth daily before breakfast. 30 tablet 11  . furosemide (LASIX) 20 MG tablet Take 1 tablet (20 mg total) by mouth every other day. 45 tablet 3  . sacubitril-valsartan (ENTRESTO) 97-103 MG Take 1 tablet by mouth 2 (two) times daily. 60 tablet 11   No current facility-administered medications for this encounter.   Vitals:   04/17/19 1347  BP: (!) 146/92  Weight: 79.9 kg (176 lb 3.2 oz)   Wt Readings from Last 3 Encounters:  04/17/19 79.9 kg (176 lb 3.2 oz)  04/03/19 79.9 kg (176 lb 3.2 oz)  03/20/19 81.6 kg (179 lb 12.8 oz)    General:  Well appearing. No resp difficulty HEENT: normal Neck: supple. no JVD. Carotids 2+ bilat; no bruits. No lymphadenopathy or thryomegaly appreciated. Cor: PMI nondisplaced. Regular rate & rhythm. No rubs, gallops or murmurs. Lungs: clear Abdomen: soft, nontender, nondistended. No hepatosplenomegaly. No bruits  or masses. Good bowel sounds. Extremities: no cyanosis, clubbing, rash, edema Neuro: alert & orientedx3, cranial nerves grossly intact. moves all 4 extremities w/o difficulty. Affect pleasant   Assessment/Plan: 1. Chronic systolic CHF: Nonischemic cardiomyopathy.  Cardiac MRI from 1/17 showed improvement in EF up to 55%.  Possible viral myocarditis with recovery.  Uncontrolled HTN  - ECHO concerning with reduced EF < 30% mod-severe MR . Dr McLean reviewed.   - Continue  lasix 20 mg every other day.  - Increase coreg 25 mg twice a day.    - Continue entresto 97-103 mg twice a day.  - Add spironolactone  12.5 mg daily.  - Continue farxiga  10 mg daily.  - Check BMET  2. CAD: Moderate nonobstructive CAD on cath 2016 No S/S ischemia.  - Continue ASA 81.   3. HTN:  - Elevated.  -Increase carvedilol 25 mg twice a day.  - Add 12.5 mg spironolactone daily.  - Check BMET  4.Mitral Valve Stenosis Noted on ECHO. Will need TEE to assess valve.   Follow up in 4 weeks with Dr McLean.     Pamela Leoni NP-C  04/17/2019  

## 2019-04-17 NOTE — Addendum Note (Signed)
Encounter addended by: Nicole Cella, RN on: 04/17/2019 3:00 PM  Actions taken: Order list changed, Diagnosis association updated

## 2019-04-17 NOTE — H&P (View-Only) (Signed)
Patient ID: Pamela Brooks, female   DOB: Nov 13, 1970, 49 y.o.   MRN: [id number]    Advanced Heart Failure Clinic Note   PCP: None  HF Cardiology: Dr. Shirlee Latch  Pamela Brooks is a 48 year with history of HTN and chronic systolic CHF presents for CHF clinic evaluation.  She went to the ER in 6/16 with dyspnea and peripheral edema. No chest pain.  No prior viral-type illness.  She was found to be in CHF, echo with EF 25-30%.  LHC was done, showing nonobstructive stenosis in the circumflex.  She was diuresed and BP controlled.    Cardiac MRI in 1/17 showed EF improved to 55%. Admitted 1/23 -03/20/15 with hypotension and near syncope. It was discovered pt had continued her losartan on top of her Entresto. Meds helded and then resumed without losartan.  Spironolactone stopped with hypotension and improved EF. Discharge weight 167 lbs.  Today she returns for HF follow up. Last visit farxiga was started. Overall feeling much better. Denies dizziness. Denies SOB/PND/Orthopnea. Appetite ok. No fever or chills. Weight at home 173 pounds. Taking all medications.  Working full time at [employer].   PMH: 1. HTN 2. Chronic systolic CHF: Nonischemic cardiomyopathy, ?due to myocarditis versus HTN.   - LHC (6/16) with 50% mLCx stenosis.  Echo (6/16) with EF 25-30%, moderately dilated LV, moderate MR.   - Echo (10/16) with EF 30%, moderate LV dilation.  - cMRI  03/19/15 LVEF 55%, normal RV, no LGE, so no definitive evidence for prior MI, myocarditis, or infiltrative disease. - Echo 03/2019 EF 35-40%, mitral valve restricted posterior leaflet, moderate mitral stenosis 3. CAD: LHC (6/16) with 50% mLCx stenosis.   SH: Lives with parents in [place].  Works in Science writer.  Non smoker, no ETOH.   FH: Father and mother with HTN.  No CHF or CAD that she knows of.    ROS: All systems reviewed and negative except as per HPI.   Current Outpatient Medications  Medication Sig Dispense Refill  . acetaminophen (TYLENOL) 500 MG  tablet Take 500 mg by mouth every 6 (six) hours as needed for mild pain.    Marland Kitchen aspirin EC 81 MG EC tablet Take 1 tablet (81 mg total) by mouth daily. 30 tablet 0  . atorvastatin (LIPITOR) 40 MG tablet Take 1 tablet (40 mg total) by mouth daily. 30 tablet 5  . carvedilol (COREG) 12.5 MG tablet Take 1.5 tablets (18.75 mg total) by mouth 2 (two) times daily with a meal. 90 tablet 3  . dapagliflozin propanediol (FARXIGA) 10 MG TABS tablet Take 10 mg by mouth daily before breakfast. 30 tablet 11  . furosemide (LASIX) 20 MG tablet Take 1 tablet (20 mg total) by mouth every other day. 45 tablet 3  . sacubitril-valsartan (ENTRESTO) 97-103 MG Take 1 tablet by mouth 2 (two) times daily. 60 tablet 11   No current facility-administered medications for this encounter.   Vitals:   04/17/19 1347  BP: (!) 146/92  Weight: 79.9 kg (176 lb 3.2 oz)   Wt Readings from Last 3 Encounters:  04/17/19 79.9 kg (176 lb 3.2 oz)  04/03/19 79.9 kg (176 lb 3.2 oz)  03/20/19 81.6 kg (179 lb 12.8 oz)    General:  Well appearing. No resp difficulty HEENT: normal Neck: supple. no JVD. Carotids 2+ bilat; no bruits. No lymphadenopathy or thryomegaly appreciated. Cor: PMI nondisplaced. Regular rate & rhythm. No rubs, gallops or murmurs. Lungs: clear Abdomen: soft, nontender, nondistended. No hepatosplenomegaly. No bruits  or masses. Good bowel sounds. Extremities: no cyanosis, clubbing, rash, edema Neuro: alert & orientedx3, cranial nerves grossly intact. moves all 4 extremities w/o difficulty. Affect pleasant   Assessment/Plan: 1. Chronic systolic CHF: Nonischemic cardiomyopathy.  Cardiac MRI from 1/17 showed improvement in EF up to 55%.  Possible viral myocarditis with recovery.  Uncontrolled HTN  - ECHO concerning with reduced EF < 30% mod-severe MR . Dr Aundra Dubin reviewed.   - Continue  lasix 20 mg every other day.  - Increase coreg 25 mg twice a day.    - Continue entresto 97-103 mg twice a day.  - Add spironolactone  12.5 mg daily.  - Continue farxiga  10 mg daily.  - Check BMET  2. CAD: Moderate nonobstructive CAD on cath 2016 No S/S ischemia.  - Continue ASA 81.   3. HTN:  - Elevated.  -Increase carvedilol 25 mg twice a day.  - Add 12.5 mg spironolactone daily.  - Check BMET  4.Mitral Valve Stenosis Noted on ECHO. Will need TEE to assess valve.   Follow up in 4 weeks with Dr Aundra Dubin.     Pamela Boylan NP-C  04/17/2019

## 2019-04-17 NOTE — Patient Instructions (Signed)
Lab work done today. We will notify you of any abnormal lab work. No news is good news!  INCREASE Carvedilol to 25mg  tab two times daily.  START Spironolactone 12.5mg  (0.5 tab) daily  Your physician has requested that you have a TEE. During a TEE, sound waves are used to create images of your heart. It provides your doctor with information about the size and shape of your heart and how well your heart's chambers and valves are working. In this test, a transducer is attached to the end of a flexible tube that's guided down your throat and into your esophagus (the tube leading from you mouth to your stomach) to get a more detailed image of your heart. You are not awake for the procedure. Please see the instruction sheet given to you today. For further information please visit .   Please follow up with the [facility] in 4 weeks.  At the [facility], you and your health needs are our priority. As part of our continuing mission to provide you with exceptional heart care, we have created designated Provider Care Teams. These Care Teams include your primary Cardiologist (physician) and Advanced Practice Providers (APPs- Physician Assistants and Nurse Practitioners) who all work together to provide you with the care you need, when you need it.   You may see any of the following providers on your designated Care Team at your next follow up: [email or web address] Dr Marland Kitchen . Dr Arvilla Meres . Marca Ancona, NP . Tonye Becket, PA . Robbie Lis, PharmD   Please be sure to bring in all your medications bottles to every appointment.

## 2019-04-17 NOTE — Progress Notes (Signed)
Letter given for TEE

## 2019-04-18 ENCOUNTER — Encounter (HOSPITAL_COMMUNITY): Payer: BC Managed Care – PPO

## 2019-04-26 ENCOUNTER — Other Ambulatory Visit (HOSPITAL_COMMUNITY)
Admission: RE | Admit: 2019-04-26 | Discharge: 2019-04-26 | Disposition: A | Discharge status: Home / Self Care | Payer: BC Managed Care – PPO | Source: Ambulatory Visit | Attending: Cardiology | Admitting: Cardiology

## 2019-04-26 DIAGNOSIS — Z20822 Contact with and (suspected) exposure to covid-19: Secondary | ICD-10-CM | POA: Insufficient documentation

## 2019-04-26 DIAGNOSIS — Z01812 Encounter for preprocedural laboratory examination: Secondary | ICD-10-CM | POA: Diagnosis present

## 2019-04-26 LAB — SARS CORONAVIRUS 2 (TAT 6-24 HRS): SARS Coronavirus 2: NEGATIVE

## 2019-04-27 NOTE — Anesthesia Preprocedure Evaluation (Addendum)
Anesthesia Evaluation  Patient identified by MRN, date of birth, ID band Patient awake    Reviewed: Allergy & Precautions, NPO status , Patient's Chart, lab work & pertinent test results  Airway Mallampati: III  TM Distance: <3 FB Neck ROM: Full    Dental no notable dental hx. (+) Poor Dentition, Dental Advisory Given   Pulmonary neg pulmonary ROS,    Pulmonary exam normal breath sounds clear to auscultation       Cardiovascular hypertension, Pt. on medications and Pt. on home beta blockers + CAD and +CHF  Normal cardiovascular exam+ Valvular Problems/Murmurs  Rhythm:Regular Rate:Normal  04/03/19 Echo  ejection fraction,  by estimation, is 35 to 40%. The left ventricle has moderate to severely  Cardiomyopathy EF now 25-30    Neuro/Psych    GI/Hepatic   Endo/Other  diabetes  Renal/GU K+ 3.7 Cr 1.10     Musculoskeletal   Abdominal   Peds  Hematology negative hematology ROS (+)   Anesthesia Other Findings   Reproductive/Obstetrics                            Anesthesia Physical Anesthesia Plan  ASA: IV  Anesthesia Plan: MAC   Post-op Pain Management:    Induction:   PONV Risk Score and Plan: Treatment may vary due to age or medical condition  Airway Management Planned: Nasal Cannula and Natural Airway  Additional Equipment: None  Intra-op Plan:   Post-operative Plan:   Informed Consent:     Dental advisory given  Plan Discussed with:   Anesthesia Plan Comments: (TEE for MStenosis)        Anesthesia Quick Evaluation

## 2019-04-28 ENCOUNTER — Ambulatory Visit (HOSPITAL_COMMUNITY)
Admission: RE | Admit: 2019-04-28 | Discharge: 2019-04-28 | Disposition: A | Discharge status: Home / Self Care | Payer: BC Managed Care – PPO | Attending: Cardiology | Admitting: Cardiology

## 2019-04-28 ENCOUNTER — Ambulatory Visit (HOSPITAL_COMMUNITY): Payer: BC Managed Care – PPO | Admitting: Anesthesiology

## 2019-04-28 ENCOUNTER — Ambulatory Visit (HOSPITAL_BASED_OUTPATIENT_CLINIC_OR_DEPARTMENT_OTHER)
Admission: RE | Admit: 2019-04-28 | Discharge: 2019-04-28 | Disposition: A | Discharge status: Home / Self Care | Payer: BC Managed Care – PPO | Source: Home / Self Care | Attending: Cardiology | Admitting: Cardiology

## 2019-04-28 ENCOUNTER — Other Ambulatory Visit: Payer: Self-pay

## 2019-04-28 ENCOUNTER — Encounter (HOSPITAL_COMMUNITY)
Admission: RE | Disposition: A | Discharge status: Home / Self Care | Payer: Self-pay | Source: Home / Self Care | Attending: Cardiology

## 2019-04-28 DIAGNOSIS — Z8249 Family history of ischemic heart disease and other diseases of the circulatory system: Secondary | ICD-10-CM | POA: Insufficient documentation

## 2019-04-28 DIAGNOSIS — I2510 Atherosclerotic heart disease of native coronary artery without angina pectoris: Secondary | ICD-10-CM | POA: Insufficient documentation

## 2019-04-28 DIAGNOSIS — I428 Other cardiomyopathies: Secondary | ICD-10-CM | POA: Diagnosis not present

## 2019-04-28 DIAGNOSIS — Z7984 Long term (current) use of oral hypoglycemic drugs: Secondary | ICD-10-CM | POA: Diagnosis not present

## 2019-04-28 DIAGNOSIS — Z79899 Other long term (current) drug therapy: Secondary | ICD-10-CM | POA: Diagnosis not present

## 2019-04-28 DIAGNOSIS — I110 Hypertensive heart disease with heart failure: Secondary | ICD-10-CM | POA: Diagnosis not present

## 2019-04-28 DIAGNOSIS — Z7982 Long term (current) use of aspirin: Secondary | ICD-10-CM | POA: Diagnosis not present

## 2019-04-28 DIAGNOSIS — I050 Rheumatic mitral stenosis: Secondary | ICD-10-CM | POA: Diagnosis present

## 2019-04-28 DIAGNOSIS — I340 Nonrheumatic mitral (valve) insufficiency: Secondary | ICD-10-CM | POA: Diagnosis not present

## 2019-04-28 DIAGNOSIS — I5022 Chronic systolic (congestive) heart failure: Secondary | ICD-10-CM | POA: Insufficient documentation

## 2019-04-28 HISTORY — PX: TEE WITHOUT CARDIOVERSION: SHX5443

## 2019-04-28 SURGERY — ECHOCARDIOGRAM, TRANSESOPHAGEAL
Anesthesia: Monitor Anesthesia Care

## 2019-04-28 MED ORDER — PROPOFOL 10 MG/ML IV BOLUS
INTRAVENOUS | Status: DC | PRN
  Administered 2019-04-28: 30 mg via INTRAVENOUS

## 2019-04-28 MED ORDER — SODIUM CHLORIDE 0.9 % IV SOLN: INTRAVENOUS | Status: DC

## 2019-04-28 MED ORDER — PROPOFOL 500 MG/50ML IV EMUL
INTRAVENOUS | Status: DC | PRN
  Administered 2019-04-28: 100 ug/kg/min via INTRAVENOUS

## 2019-04-28 NOTE — CV Procedure (Addendum)
Procedure: TEE  Sedation: Per anesthesiology  Indication: Mitral valve disease.  Findings: Please see echo section for full report.  Mildly dilated left ventricle with mild LV hypertrophy.  EF 45%, mild diffuse hypokinesis.  Normal RV size with mildly decreased systolic function.  Mild left atrial enlargement, no LA appendage thrombus.  Mild right atrial enlargement.  Trivial tricuspid regurgitation, peak RV-RA 47 mmHg.  Trileaflet aortic valve, no stenosis or regurgitation.  The posterior mitral leaflet was restricted, no prolapse or flail.  There was moderate mitral regurgitation with ERO 0.2 cm^2 and regurgitant volume 42 mL by PISA.  Pulmonary vein interrogation was difficult but I do not think there was systolic flow reversal.  No significant mitral stenosis, mean gradient 2 mmHg.  No evidence for PFO or ASD by color doppler.  Normal caliber thoracic aorta with no significant plaque. Small pericardial effusion.   Mitral regurgitation appeared moderate.   Pamela Brooks 04/28/2019 10:57 AM

## 2019-04-28 NOTE — Progress Notes (Signed)
  Echocardiogram 2D Echocardiogram has been performed.  Gerda Diss 04/28/2019, 11:09 AM

## 2019-04-28 NOTE — Transfer of Care (Signed)
Immediate Anesthesia Transfer of Care Note  Patient: Pamela Brooks  Procedure(s) Performed: TRANSESOPHAGEAL ECHOCARDIOGRAM (TEE) (N/A )  Patient Location: Endoscopy Unit  Anesthesia Type:MAC  Level of Consciousness: drowsy and patient cooperative  Airway & Oxygen Therapy: Patient Spontanous Breathing  Post-op Assessment: Report given to RN, Post -op Vital signs reviewed and stable and Patient moving all extremities X 4  Post vital signs: Reviewed and stable  Last Vitals:  Vitals Value Taken Time  BP 169/101 04/28/19 1053  Temp    Pulse 80 04/28/19 1054  Resp 19 04/28/19 1054  SpO2 99 % 04/28/19 1054  Vitals shown include unvalidated device data.  Last Pain:  Vitals:   04/28/19 0854  TempSrc: Oral  PainSc: 0-No pain         Complications: No apparent anesthesia complications

## 2019-04-28 NOTE — Interval H&P Note (Signed)
History and Physical Interval Note:  04/28/2019 10:24 AM  Pamela Brooks  has presented today for surgery, with the diagnosis of MITRAL VALVE STENOSIS.  The various methods of treatment have been discussed with the patient and family. After consideration of risks, benefits and other options for treatment, the patient has consented to  Procedure(s): TRANSESOPHAGEAL ECHOCARDIOGRAM (TEE) (N/A) as a surgical intervention.  The patient's history has been reviewed, patient examined, no change in status, stable for surgery.  I have reviewed the patient's chart and labs.  Questions were answered to the patient's satisfaction.     Idelia Caudell [employer]

## 2019-04-28 NOTE — Discharge Instructions (Signed)

## 2019-04-30 NOTE — Anesthesia Postprocedure Evaluation (Signed)
Anesthesia Post Note  Patient: Pamela Brooks  Procedure(s) Performed: TRANSESOPHAGEAL ECHOCARDIOGRAM (TEE) (N/A )     Patient location during evaluation: Endoscopy Anesthesia Type: MAC Level of consciousness: awake and alert Pain management: pain level controlled Vital Signs Assessment: post-procedure vital signs reviewed and stable Respiratory status: spontaneous breathing, nonlabored ventilation, respiratory function stable and patient connected to nasal cannula oxygen Cardiovascular status: blood pressure returned to baseline and stable Postop Assessment: no apparent nausea or vomiting Anesthetic complications: no    Last Vitals:  Vitals:   04/28/19 1104 04/28/19 1114  BP: (!) 176/111 (!) 180/122  Pulse: 74 70  Resp: 12 13  Temp:    SpO2: 100% 100%    Last Pain:  Vitals:   04/28/19 1114  TempSrc:   PainSc: 0-No pain                 Barnet Glasgow

## 2019-05-15 ENCOUNTER — Encounter (HOSPITAL_COMMUNITY): Payer: Self-pay | Admitting: Cardiology

## 2019-05-15 ENCOUNTER — Ambulatory Visit (HOSPITAL_COMMUNITY)
Admission: RE | Admit: 2019-05-15 | Discharge: 2019-05-15 | Disposition: A | Discharge status: Home / Self Care | Payer: BC Managed Care – PPO | Source: Ambulatory Visit | Attending: Cardiology | Admitting: Cardiology

## 2019-05-15 ENCOUNTER — Other Ambulatory Visit: Payer: Self-pay

## 2019-05-15 VITALS — BP 136/84 | HR 78 | Wt 170.0 lb

## 2019-05-15 DIAGNOSIS — I110 Hypertensive heart disease with heart failure: Secondary | ICD-10-CM | POA: Insufficient documentation

## 2019-05-15 DIAGNOSIS — Z8249 Family history of ischemic heart disease and other diseases of the circulatory system: Secondary | ICD-10-CM | POA: Insufficient documentation

## 2019-05-15 DIAGNOSIS — I340 Nonrheumatic mitral (valve) insufficiency: Secondary | ICD-10-CM | POA: Insufficient documentation

## 2019-05-15 DIAGNOSIS — Z79899 Other long term (current) drug therapy: Secondary | ICD-10-CM | POA: Diagnosis not present

## 2019-05-15 DIAGNOSIS — I5022 Chronic systolic (congestive) heart failure: Secondary | ICD-10-CM | POA: Insufficient documentation

## 2019-05-15 DIAGNOSIS — Z7984 Long term (current) use of oral hypoglycemic drugs: Secondary | ICD-10-CM | POA: Insufficient documentation

## 2019-05-15 DIAGNOSIS — I428 Other cardiomyopathies: Secondary | ICD-10-CM | POA: Insufficient documentation

## 2019-05-15 DIAGNOSIS — Z7982 Long term (current) use of aspirin: Secondary | ICD-10-CM | POA: Diagnosis not present

## 2019-05-15 DIAGNOSIS — I2510 Atherosclerotic heart disease of native coronary artery without angina pectoris: Secondary | ICD-10-CM | POA: Insufficient documentation

## 2019-05-15 LAB — BASIC METABOLIC PANEL
Anion gap: 8 (ref 5–15)
BUN: 10 mg/dL (ref 6–20)
CO2: 23 mmol/L (ref 22–32)
Calcium: 8.9 mg/dL (ref 8.9–10.3)
Chloride: 110 mmol/L (ref 98–111)
Creatinine, Ser: 0.98 mg/dL (ref 0.44–1.00)
GFR calc Af Amer: >60 mL/min (ref >60)
GFR calc non Af Amer: >60 mL/min (ref >60)
Glucose, Bld: 87 mg/dL (ref 70–99)
Potassium: 4 mmol/L (ref 3.5–5.1)
Sodium: 141 mmol/L (ref 135–145)

## 2019-05-15 MED ORDER — SPIRONOLACTONE 25 MG PO TABS: 25.0000 mg | ORAL_TABLET | Freq: Every day | ORAL | 3 refills | Status: DC

## 2019-05-15 NOTE — Patient Instructions (Addendum)
INCREASE Spironolactone to 25mg  (1 tab) daily  Labs today and repeat in 10 days We will only contact you if something comes back abnormal or we need to make some changes. Otherwise no news is good news!  Your physician recommends that you schedule a follow-up appointment in: 3 months with Dr .    GARAGE CODE:  April: 5009  June: 5007  Please call office at [phone] option [phone] if you have any questions or concerns.   At the [facility], you and your health needs are our priority. As part of our continuing mission to provide you with exceptional heart care, we have created designated Provider Care Teams. These Care Teams include your primary Cardiologist (physician) and Advanced Practice Providers (APPs- Physician Assistants and Nurse Practitioners) who all work together to provide you with the care you need, when you need it.   You may see any of the following providers on your designated Care Team at your next follow up: [phone] Dr Marland Kitchen . Dr Arvilla Meres . Marca Ancona, NP . Tonye Becket, PA . Robbie Lis, PharmD   Please be sure to bring in all your medications bottles to every appointment.

## 2019-05-16 NOTE — Progress Notes (Signed)
Patient ID: Pamela Brooks, female   DOB: 1970-06-14, 49 y.o.   MRN: [id number]    [facility] Note   PCP: None  HF Cardiology: Dr. Aundra Dubin  Ms Pamela Brooks is a 49 y.o. with history of HTN and chronic systolic CHF presents for CHF clinic evaluation.  She went to the ER in 6/16 with dyspnea and peripheral edema. No chest pain.  No prior viral-type illness.  She was found to be in CHF, echo with EF 25-30%.  LHC was done, showing nonobstructive stenosis in the circumflex.  She was diuresed and BP controlled.    Cardiac MRI in 1/17 showed EF improved to 55%. Admitted 1/23 -03/20/15 with hypotension and near syncope. It was discovered pt had continued her losartan on top of her Entresto. Meds helded and then resumed without losartan.  Spironolactone stopped with hypotension and improved EF. Discharge weight 167 lbs. Echo in 2/21 showed EF back down to 35-40% with restrictive posterior mitral leaflet and possible moderate mitral stenosis with moderate MR.   TEE was then done in 3/21, showing EF 45% with mild LV dilation/mild LV hypertrophy, mildly decreased RV systolic function, moderate MR, no mitral stenosis.   Today she returns for HF follow up.  She is doing well symptomatically.  No significant exertional dyspnea.  No orthopnea/PND.  No chest pain.  She walks for exercise.  No lightheadedness.  Taking all meds.   Labs (1/21): LDL 86, HDL 40 Labs (2/21): K 3.7, creatinine 1.1  ECG (personally reviewed): NSR, anterolateral TWIs  PMH: 1. HTN 2. Chronic systolic CHF: Nonischemic cardiomyopathy, ?due to myocarditis versus HTN.   - LHC (6/16) with 50% mLCx stenosis.  Echo (6/16) with EF 25-30%, moderately dilated LV, moderate MR.   - Echo (10/16) with EF 30%, moderate LV dilation.  - cMRI  03/19/15 LVEF 55%, normal RV, no LGE, so no definitive evidence for prior MI, myocarditis, or infiltrative disease. - Echo 03/2019 EF 35-40%, mitral valve restricted posterior leaflet, moderate mitral  stenosis - TEE (3/21): EF 45%, mild LV dilation with mild LVH, mildly decreased RV systolic function, moderate MR with ERO 0.2 cm^2, no mitral stenosis, posterior mitral leaflet restriction.  3. CAD: LHC (6/16) with 50% mLCx stenosis.  4. Mitral valve disorder: TEE in 3/21 with moderate MR and restricted posterior leaflet, no MS.   SH: Lives with parents in [place].  Works at [employer].  Non smoker, no ETOH.   FH: Father and mother with HTN.  No CHF or CAD that she knows of.    ROS: All systems reviewed and negative except as per HPI.   Current Outpatient Medications  Medication Sig Dispense Refill  . acetaminophen (TYLENOL) 500 MG tablet Take 500 mg by mouth every 6 (six) hours as needed for mild pain.    Marland Kitchen aspirin EC 81 MG EC tablet Take 1 tablet (81 mg total) by mouth daily. 30 tablet 0  . atorvastatin (LIPITOR) 40 MG tablet Take 1 tablet (40 mg total) by mouth daily. 30 tablet 5  . carvedilol (COREG) 25 MG tablet Take 1 tablet (25 mg total) by mouth 2 (two) times daily with a meal. 60 tablet 3  . dapagliflozin propanediol (FARXIGA) 10 MG TABS tablet Take 10 mg by mouth daily before breakfast. 30 tablet 11  . furosemide (LASIX) 20 MG tablet Take 1 tablet (20 mg total) by mouth every other day. 45 tablet 3  . sacubitril-valsartan (ENTRESTO) 97-103 MG Take 1 tablet by mouth 2 (two) times  daily. 60 tablet 11  . spironolactone (ALDACTONE) 25 MG tablet Take 1 tablet (25 mg total) by mouth daily. 90 tablet 3   No current facility-administered medications for this encounter.   Vitals:   05/15/19 1529  BP: 136/84  Pulse: 78  SpO2: 100%  Weight: 77.1 kg (170 lb)   Wt Readings from Last 3 Encounters:  05/15/19 77.1 kg (170 lb)  04/17/19 79.9 kg (176 lb 3.2 oz)  04/03/19 79.9 kg (176 lb 3.2 oz)    General: NAD Neck: No JVD, no thyromegaly or thyroid nodule.  Lungs: Clear to auscultation bilaterally with normal respiratory effort. CV: Nondisplaced PMI.  Heart regular S1/S2, no S3/S4,  no significant murmur.  No peripheral edema.  No carotid bruit.  Normal pedal pulses.  Abdomen: Soft, nontender, no hepatosplenomegaly, no distention.  Skin: Intact without lesions or rashes.  Neurologic: Alert and oriented x 3.  Psych: Normal affect. Extremities: No clubbing or cyanosis.  HEENT: Normal.   Assessment/Plan: 1. Chronic systolic CHF: Nonischemic cardiomyopathy.  Cardiac MRI from 1/17 showed improvement in EF up to 55%.  Possible viral myocarditis with recovery. However, TEE in 3/21 showed EF down a bit to 45%.  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  NYHA class I-II, on exam she is not volume overloaded.  - Continue  lasix 20 mg every other day.  - Continue Coreg 25 mg twice a day.    - Continue entresto 97-103 mg twice a day.  - Increase spironolactone to 25 mg daily, BMET today and again in 10 days.  - Continue Farxiga 10 mg daily.  2. CAD: Moderate nonobstructive CAD on cath 2016.  No chest pain.  - Continue ASA 81.  - Continue atorvastatin 40 mg daily.   3. HTN: Difficult to control.  Would like to see SBP < 130.  - Increase spironolactone as above.  4. Mitral valve disorders: TEE in 3/21 with restricted posterior mitral leaflet and moderate MR with no mitral stenosis.    Followup in 3 months.   Marca Pamela Brooks 05/16/2019

## 2019-05-25 ENCOUNTER — Other Ambulatory Visit (HOSPITAL_COMMUNITY): Payer: BC Managed Care – PPO

## 2019-05-31 ENCOUNTER — Telehealth (HOSPITAL_COMMUNITY): Payer: Self-pay | Admitting: Vascular Surgery

## 2019-05-31 NOTE — Telephone Encounter (Signed)
Left pt vm that 6/28 appt w/ be moved 7/6 , asked pt to call back to confirm

## 2019-07-25 ENCOUNTER — Other Ambulatory Visit (HOSPITAL_COMMUNITY): Payer: Self-pay

## 2019-07-25 MED ORDER — CARVEDILOL 25 MG PO TABS: 25.0000 mg | ORAL_TABLET | Freq: Two times a day (BID) | ORAL | 11 refills | Status: DC

## 2019-08-21 ENCOUNTER — Encounter (HOSPITAL_COMMUNITY): Payer: BC Managed Care – PPO | Admitting: Cardiology

## 2019-08-29 ENCOUNTER — Other Ambulatory Visit: Payer: Self-pay

## 2019-08-29 ENCOUNTER — Encounter (HOSPITAL_COMMUNITY): Payer: Self-pay | Admitting: Cardiology

## 2019-08-29 ENCOUNTER — Ambulatory Visit (HOSPITAL_COMMUNITY)
Admission: RE | Admit: 2019-08-29 | Discharge: 2019-08-29 | Disposition: A | Discharge status: Home / Self Care | Payer: BC Managed Care – PPO | Source: Ambulatory Visit | Attending: Cardiology | Admitting: Cardiology

## 2019-08-29 VITALS — BP 124/76 | HR 67 | Wt 171.0 lb

## 2019-08-29 DIAGNOSIS — I5022 Chronic systolic (congestive) heart failure: Secondary | ICD-10-CM

## 2019-08-29 DIAGNOSIS — I110 Hypertensive heart disease with heart failure: Secondary | ICD-10-CM | POA: Insufficient documentation

## 2019-08-29 DIAGNOSIS — Z7982 Long term (current) use of aspirin: Secondary | ICD-10-CM | POA: Diagnosis not present

## 2019-08-29 DIAGNOSIS — Z8249 Family history of ischemic heart disease and other diseases of the circulatory system: Secondary | ICD-10-CM | POA: Insufficient documentation

## 2019-08-29 DIAGNOSIS — Z7984 Long term (current) use of oral hypoglycemic drugs: Secondary | ICD-10-CM | POA: Diagnosis not present

## 2019-08-29 DIAGNOSIS — I340 Nonrheumatic mitral (valve) insufficiency: Secondary | ICD-10-CM | POA: Diagnosis not present

## 2019-08-29 DIAGNOSIS — Z79899 Other long term (current) drug therapy: Secondary | ICD-10-CM | POA: Diagnosis not present

## 2019-08-29 DIAGNOSIS — I428 Other cardiomyopathies: Secondary | ICD-10-CM | POA: Diagnosis not present

## 2019-08-29 DIAGNOSIS — I2510 Atherosclerotic heart disease of native coronary artery without angina pectoris: Secondary | ICD-10-CM | POA: Insufficient documentation

## 2019-08-29 LAB — BASIC METABOLIC PANEL
Anion gap: 8 (ref 5–15)
BUN: 12 mg/dL (ref 6–20)
CO2: 24 mmol/L (ref 22–32)
Calcium: 8.6 mg/dL — ABNORMAL LOW (ref 8.9–10.3)
Chloride: 110 mmol/L (ref 98–111)
Creatinine, Ser: 1.07 mg/dL — ABNORMAL HIGH (ref 0.44–1.00)
GFR calc Af Amer: >60 mL/min (ref >60)
GFR calc non Af Amer: >60 mL/min (ref >60)
Glucose, Bld: 92 mg/dL (ref 70–99)
Potassium: 3.3 mmol/L — ABNORMAL LOW (ref 3.5–5.1)
Sodium: 142 mmol/L (ref 135–145)

## 2019-08-29 MED ORDER — DAPAGLIFLOZIN PROPANEDIOL 10 MG PO TABS: 10.0000 mg | ORAL_TABLET | Freq: Every day | ORAL | 3 refills | Status: DC

## 2019-08-29 MED ORDER — SPIRONOLACTONE 25 MG PO TABS: 25.0000 mg | ORAL_TABLET | Freq: Every day | ORAL | 3 refills | Status: DC

## 2019-08-29 NOTE — Patient Instructions (Signed)
Restart Farxiga 10 mg DAILY  Restart Spironolactone 25 mg DAILY  Labs done today, your results will be available in MyChart, we will contact you for abnormal readings.  Labs needed again in 2 weeks  Your physician recommends that you schedule a follow-up appointment in: 3 months  If you have any questions or concerns before your next appointment please send [place] a message through [place] or call our office at [phone].    TO LEAVE A MESSAGE FOR THE NURSE SELECT OPTION 2, PLEASE LEAVE A MESSAGE INCLUDING: . YOUR NAME . DATE OF BIRTH . CALL BACK NUMBER . REASON FOR CALL**this is important as we prioritize the call backs  YOU WILL RECEIVE A CALL BACK THE SAME DAY AS LONG AS YOU CALL BEFORE 4:00 PM  At the [facility], you and your health needs are our priority. As part of our continuing mission to provide you with exceptional heart care, we have created designated Provider Care Teams. These Care Teams include your primary Cardiologist (physician) and Advanced Practice Providers (APPs- Physician Assistants and Nurse Practitioners) who all work together to provide you with the care you need, when you need it.   You may see any of the following providers on your designated Care Team at your next follow up: Marland Kitchen Dr Arvilla Meres . Dr Marca Ancona . Tonye Becket, NP . Robbie Lis, PA . Karle Plumber, PharmD   Please be sure to bring in all your medications bottles to every appointment.

## 2019-08-29 NOTE — Progress Notes (Signed)
Patient ID: Pamela Brooks, female   DOB: [birth year], 49 y.o.   MRN: [id number]    [facility] Note   PCP: None  HF Cardiology: Dr. Shirlee Latch  [name] is a 49 y.o. with history of HTN and chronic systolic CHF presents for CHF clinic evaluation.  She went to the ER in 6/16 with dyspnea and peripheral edema. No chest pain.  No prior viral-type illness.  She was found to be in CHF, echo with EF 25-30%.  LHC was done, showing nonobstructive stenosis in the circumflex.  She was diuresed and BP controlled.    Cardiac MRI in 1/17 showed EF improved to 55%. Admitted 1/23 -03/20/15 with hypotension and near syncope. It was discovered pt had continued her losartan on top of her Entresto. Meds helded and then resumed without losartan.  Spironolactone stopped with hypotension and improved EF. Discharge weight 167 lbs. Echo in 2/21 showed EF back down to 35-40% with restrictive posterior mitral leaflet and possible moderate mitral stenosis with moderate MR.   TEE was then done in 3/21, showing EF 45% with mild LV dilation/mild LV hypertrophy, mildly decreased RV systolic function, moderate MR, no mitral stenosis.   Today she returns for HF follow up.  Weight is stable.  She has mild dyspnea walking up stairs, no dyspnea walking on flat ground.  No orthopnea/PND.  She gets some edema in her feet after standing for a prolonged period for her work shift.  She has been out of both dapagliflozin and spironolactone, she did not realize she was supposed to continue them after initial prescriptions ran out.   Labs (1/21): LDL 86, HDL 40 Labs (2/21): K 3.7, creatinine 1.1 Labs (3/21): K 4, creatinine 0.98  PMH: 1. HTN 2. Chronic systolic CHF: Nonischemic cardiomyopathy, ?due to myocarditis versus HTN.   - LHC (6/16) with 50% mLCx stenosis.  Echo (6/16) with EF 25-30%, moderately dilated LV, moderate MR.   - Echo (10/16) with EF 30%, moderate LV dilation.  - cMRI  03/19/15 LVEF 55%, normal RV, no LGE, so  no definitive evidence for prior MI, myocarditis, or infiltrative disease. - Echo 03/2019 EF 35-40%, mitral valve restricted posterior leaflet, moderate mitral stenosis - TEE (3/21): EF 45%, mild LV dilation with mild LVH, mildly decreased RV systolic function, moderate MR with ERO 0.2 cm^2, no mitral stenosis, posterior mitral leaflet restriction.  3. CAD: LHC (6/16) with 50% mLCx stenosis.  4. Mitral valve disorder: TEE in 3/21 with moderate MR and restricted posterior leaflet, no MS.   SH: Lives with parents in [place].  Works at [employer].  Non smoker, no ETOH.   FH: Father and mother with HTN.  No CHF or CAD that she knows of.    ROS: All systems reviewed and negative except as per HPI.   Current Outpatient Medications  Medication Sig Dispense Refill  . acetaminophen (TYLENOL) 500 MG tablet Take 500 mg by mouth every 6 (six) hours as needed for mild pain.    Marland Kitchen aspirin EC 81 MG EC tablet Take 1 tablet (81 mg total) by mouth daily. 30 tablet 0  . atorvastatin (LIPITOR) 40 MG tablet Take 1 tablet (40 mg total) by mouth daily. 30 tablet 5  . carvedilol (COREG) 25 MG tablet Take 1 tablet (25 mg total) by mouth 2 (two) times daily with a meal. 60 tablet 11  . furosemide (LASIX) 20 MG tablet Take 1 tablet (20 mg total) by mouth every other day. 45 tablet 3  .  sacubitril-valsartan (ENTRESTO) 97-103 MG Take 1 tablet by mouth 2 (two) times daily. 60 tablet 11  . dapagliflozin propanediol (FARXIGA) 10 MG TABS tablet Take 1 tablet (10 mg total) by mouth daily before breakfast. 90 tablet 3  . spironolactone (ALDACTONE) 25 MG tablet Take 1 tablet (25 mg total) by mouth daily. 90 tablet 3   No current facility-administered medications for this encounter.   Vitals:   08/29/19 1018  BP: 124/76  Pulse: 67  SpO2: 100%  Weight: 77.6 kg (171 lb)   Wt Readings from Last 3 Encounters:  08/29/19 77.6 kg (171 lb)  05/15/19 77.1 kg (170 lb)  04/17/19 79.9 kg (176 lb 3.2 oz)    General: NAD Neck: No  JVD, no thyromegaly or thyroid nodule.  Lungs: Clear to auscultation bilaterally with normal respiratory effort. CV: Nondisplaced PMI.  Heart regular S1/S2, no S3/S4, no murmur.  No peripheral edema.  No carotid bruit.  Normal pedal pulses.  Abdomen: Soft, nontender, no hepatosplenomegaly, no distention.  Skin: Intact without lesions or rashes.  Neurologic: Alert and oriented x 3.  Psych: Normal affect. Extremities: No clubbing or cyanosis.  HEENT: Normal.   Assessment/Plan: 1. Chronic systolic CHF: Nonischemic cardiomyopathy.  Cardiac MRI from 1/17 showed improvement in EF up to 55%.  Possible viral myocarditis with recovery. However, TEE in 3/21 showed EF down a bit to 45%.  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  NYHA class I-II, on exam she is not volume overloaded.  - Continue  lasix 20 mg every other day.  - Continue Coreg 25 mg twice a day.    - Continue entresto 97-103 mg twice a day.  - Restart spironolactone 25 mg daily, BMET today and in 2 wks.   - Restart Farxiga 10 mg daily.  2. CAD: Moderate nonobstructive CAD on cath 2016.  No chest pain.  - Continue ASA 81.  - Continue atorvastatin 40 mg daily.   3. HTN: BP controlled.  4. Mitral valve disorders: TEE in 3/21 with restricted posterior mitral leaflet and moderate MR with no mitral stenosis.    Followup in 3 months.   Marca Ancona 08/29/2019

## 2019-09-12 ENCOUNTER — Other Ambulatory Visit (HOSPITAL_COMMUNITY): Payer: BC Managed Care – PPO

## 2019-09-28 ENCOUNTER — Emergency Department (HOSPITAL_COMMUNITY)
Admission: EM | Admit: 2019-09-28 | Discharge: 2019-09-28 | Disposition: A | Discharge status: Home / Self Care | Payer: BC Managed Care – PPO | Attending: Emergency Medicine | Admitting: Emergency Medicine

## 2019-09-28 ENCOUNTER — Other Ambulatory Visit: Payer: Self-pay

## 2019-09-28 ENCOUNTER — Emergency Department (HOSPITAL_COMMUNITY): Payer: BC Managed Care – PPO

## 2019-09-28 ENCOUNTER — Encounter (HOSPITAL_COMMUNITY): Payer: Self-pay

## 2019-09-28 DIAGNOSIS — I2510 Atherosclerotic heart disease of native coronary artery without angina pectoris: Secondary | ICD-10-CM | POA: Diagnosis not present

## 2019-09-28 DIAGNOSIS — I5023 Acute on chronic systolic (congestive) heart failure: Secondary | ICD-10-CM | POA: Diagnosis not present

## 2019-09-28 DIAGNOSIS — I110 Hypertensive heart disease with heart failure: Secondary | ICD-10-CM | POA: Diagnosis not present

## 2019-09-28 DIAGNOSIS — Z7982 Long term (current) use of aspirin: Secondary | ICD-10-CM | POA: Insufficient documentation

## 2019-09-28 DIAGNOSIS — Z79899 Other long term (current) drug therapy: Secondary | ICD-10-CM | POA: Insufficient documentation

## 2019-09-28 DIAGNOSIS — J45909 Unspecified asthma, uncomplicated: Secondary | ICD-10-CM | POA: Insufficient documentation

## 2019-09-28 DIAGNOSIS — J069 Acute upper respiratory infection, unspecified: Secondary | ICD-10-CM

## 2019-09-28 DIAGNOSIS — Z20822 Contact with and (suspected) exposure to covid-19: Secondary | ICD-10-CM | POA: Diagnosis not present

## 2019-09-28 DIAGNOSIS — R0601 Orthopnea: Secondary | ICD-10-CM | POA: Diagnosis present

## 2019-09-28 DIAGNOSIS — I509 Heart failure, unspecified: Secondary | ICD-10-CM

## 2019-09-28 LAB — CBC
HCT: 40.8 % (ref 36.0–46.0)
Hemoglobin: 13.1 g/dL (ref 12.0–15.0)
MCH: 28.5 pg (ref 26.0–34.0)
MCHC: 32.1 g/dL (ref 30.0–36.0)
MCV: 88.9 fL (ref 80.0–100.0)
Platelets: 200 10*3/uL (ref 150–400)
RBC: 4.59 MIL/uL (ref 3.87–5.11)
RDW: 13.1 % (ref 11.5–15.5)
WBC: 4.6 10*3/uL (ref 4.0–10.5)
nRBC: 0 % (ref 0.0–0.2)

## 2019-09-28 LAB — BASIC METABOLIC PANEL
Anion gap: 10 (ref 5–15)
BUN: 11 mg/dL (ref 6–20)
CO2: 22 mmol/L (ref 22–32)
Calcium: 8.5 mg/dL — ABNORMAL LOW (ref 8.9–10.3)
Chloride: 108 mmol/L (ref 98–111)
Creatinine, Ser: 1.24 mg/dL — ABNORMAL HIGH (ref 0.44–1.00)
GFR calc Af Amer: 58 mL/min — ABNORMAL LOW (ref >60)
GFR calc non Af Amer: 50 mL/min — ABNORMAL LOW (ref >60)
Glucose, Bld: 99 mg/dL (ref 70–99)
Potassium: 3.6 mmol/L (ref 3.5–5.1)
Sodium: 140 mmol/L (ref 135–145)

## 2019-09-28 LAB — TROPONIN I (HIGH SENSITIVITY)
Troponin I (High Sensitivity): 10 ng/L (ref <18)
Troponin I (High Sensitivity): 11 ng/L (ref <18)

## 2019-09-28 LAB — SARS CORONAVIRUS 2 BY RT PCR (HOSPITAL ORDER, PERFORMED IN ~~LOC~~ HOSPITAL LAB): SARS Coronavirus 2: NEGATIVE

## 2019-09-28 LAB — I-STAT BETA HCG BLOOD, ED (MC, WL, AP ONLY): I-stat hCG, quantitative: <5 m[IU]/mL (ref <5)

## 2019-09-28 LAB — BRAIN NATRIURETIC PEPTIDE: B Natriuretic Peptide: 2075.5 pg/mL — ABNORMAL HIGH (ref 0.0–100.0)

## 2019-09-28 MED ORDER — ACETAMINOPHEN 500 MG PO TABS
1000.0000 mg | ORAL_TABLET | Freq: Once | ORAL | Status: AC
  Administered 2019-09-28: 1000 mg via ORAL
  Filled 2019-09-28: qty 2

## 2019-09-28 MED ORDER — FLUTICASONE PROPIONATE 50 MCG/ACT NA SUSP
2.0000 | Freq: Every day | NASAL | 0 refills | Status: DC
Start: 2019-09-28 — End: 2021-10-16

## 2019-09-28 MED ORDER — FUROSEMIDE 10 MG/ML IJ SOLN
40.0000 mg | Freq: Once | INTRAMUSCULAR | Status: AC
  Administered 2019-09-28: 40 mg via INTRAVENOUS
  Filled 2019-09-28: qty 4

## 2019-09-28 MED ORDER — BENZONATATE 100 MG PO CAPS
100.0000 mg | ORAL_CAPSULE | Freq: Three times a day (TID) | ORAL | 0 refills | Status: AC
Start: 2019-09-28 — End: 2019-10-03

## 2019-09-28 MED ORDER — DOXYCYCLINE HYCLATE 100 MG PO CAPS
100.0000 mg | ORAL_CAPSULE | Freq: Two times a day (BID) | ORAL | 0 refills | Status: AC
Start: 2019-09-28 — End: 2019-10-05

## 2019-09-28 NOTE — ED Triage Notes (Signed)
Pt arrives POV for eval of CP, SOB onset this AM. Pt reports hx of CHF, compliant /w meds. States she noted worsening blt LE edema, CP that woke her from sleep this AM. Endorses orthopnea.

## 2019-09-28 NOTE — ED Notes (Signed)
Patient verbalizes understanding of discharge instructions. Opportunity for questioning and answers were provided. Pt discharged from ED. 

## 2019-09-28 NOTE — Discharge Instructions (Addendum)
Please take the antibiotic, nasal spray and cough medication as directed.  You will need to increase your lasix as discussed in the emergency department. You should call your cardiologist tomorrow to schedule an appointment for follow up.  Please return to the emergency department for any new or worsening symptoms.

## 2019-09-28 NOTE — ED Provider Notes (Signed)
[facility] EMERGENCY DEPARTMENT Provider Note   CSN: [id number] Arrival date & time: 09/28/19  1222     History Chief Complaint  Patient presents with   Chest Pain   Shortness of Breath    Pamela Brooks is a 49 y.o. female.  HPI   49 year old female with a history of asthma, CHF, GERD, hypertension, nonischemic cardiomyopathy, who presents to the urgency department today for evaluation of orthopnea.  She states that she feels like her allergies are worse because of just rain.  She reports nasal congestion, chest congestion and productive cough and orthopnea.  She denies any chest pain, pleuritic pain or shortness of breath when sitting or with exertion. States she had a mild amount of swelling to the BLE yesterday after work but states today swelling seems improved.  States she took some Robitussin at home and she thinks that increased her blood pressure.  She does report compliance with blood pressure medications at home.  She denies any known fevers or body aches.  She denies any other systemic symptoms at this time.  Reports compliance with lasix. Denies fever, body aches, sore throat, loss of taste and smell.   States she has not been vaccinated against COVID but does not have any known exposures.   Past Medical History:  Diagnosis Date   Asthma    CHF (congestive heart failure) (HCC)    GERD (gastroesophageal reflux disease)    HTN (hypertension)    NICM (nonischemic cardiomyopathy) (HCC)     Patient Active Problem List   Diagnosis Date Noted   Chest pain 03/18/2015   Near syncope 03/18/2015   Pain in the chest    Chronic systolic CHF (congestive heart failure) (HCC) 12/25/2014   CAD (coronary artery disease) 12/25/2014   Congestive dilated cardiomyopathy (HCC)    Non-ischemic cardiomyopathy (HCC)    Acute CHF (congestive heart failure) (HCC) 08/04/2014   SOB (shortness of breath) 08/04/2014   HTN (hypertension) 08/04/2014   CHF  (congestive heart failure) (HCC) 08/04/2014   Asthma     Past Surgical History:  Procedure Laterality Date   CARDIAC CATHETERIZATION N/A 08/06/2014   Procedure: Left Heart Cath and Coronary Angiography;  Surgeon: Marykay Lex, MD;  Location: [facility] INVASIVE CV LAB;  Service: Cardiovascular;  Laterality: N/A;   TEE WITHOUT CARDIOVERSION N/A 04/28/2019   Procedure: TRANSESOPHAGEAL ECHOCARDIOGRAM (TEE);  Surgeon: Laurey Morale, MD;  Location: [facility] ENDOSCOPY;  Service: Cardiovascular;  Laterality: N/A;     OB History   No obstetric history on file.     Family History  Problem Relation Age of Onset   Hypertension Mother    Hypertension Father    Stroke Father    Throat cancer Father    Cancer Father     Social History   Tobacco Use   Smoking status: Never Smoker   Smokeless tobacco: Never Used  Substance Use Topics   Alcohol use: No    Alcohol/week: 0.0 standard drinks   Drug use: No    Home Medications Prior to Admission medications   Medication Sig Start Date End Date Taking? Authorizing Provider  acetaminophen (TYLENOL) 500 MG tablet Take 500 mg by mouth every 6 (six) hours as needed for mild pain.   Yes [provider]  aspirin EC 81 MG EC tablet Take 1 tablet (81 mg total) by mouth daily. 08/07/14  Yes Clydia Llano, MD  atorvastatin (LIPITOR) 40 MG tablet Take 1 tablet (40 mg total) by mouth daily.  03/23/19 09/28/19 Yes Clegg, Amy D, NP  carvedilol (COREG) 25 MG tablet Take 1 tablet (25 mg total) by mouth 2 (two) times daily with a meal. 07/25/19  Yes Laurey Morale, MD  furosemide (LASIX) 20 MG tablet Take 1 tablet (20 mg total) by mouth every other day. 03/20/19 09/28/19 Yes Clegg, Amy D, NP  sacubitril-valsartan (ENTRESTO) 97-103 MG Take 1 tablet by mouth 2 (two) times daily. 03/20/19  Yes Clegg, Amy D, NP  spironolactone (ALDACTONE) 25 MG tablet Take 1 tablet (25 mg total) by mouth daily. 08/29/19  Yes Laurey Morale, MD  benzonatate (TESSALON) 100 MG  capsule Take 1 capsule (100 mg total) by mouth every 8 (eight) hours for 5 days. 09/28/19 10/03/19  Genese Quebedeaux S, PA-C  dapagliflozin propanediol (FARXIGA) 10 MG TABS tablet Take 1 tablet (10 mg total) by mouth daily before breakfast. Patient not taking: Reported on 09/28/2019 08/29/19   Laurey Morale, MD  doxycycline (VIBRAMYCIN) 100 MG capsule Take 1 capsule (100 mg total) by mouth 2 (two) times daily for 7 days. 09/28/19 10/05/19  Amogh Komatsu S, PA-C  fluticasone (FLONASE) 50 MCG/ACT nasal spray Place 2 sprays into both nostrils daily. 09/28/19   Kohle Winner S, PA-C    Allergies    Tramadol  Review of Systems   Review of Systems  Constitutional: Negative for fever.  HENT: Positive for congestion and rhinorrhea. Negative for ear pain and sore throat.   Eyes: Negative for visual disturbance.  Respiratory: Positive for cough and shortness of breath.   Cardiovascular: Positive for leg swelling. Negative for chest pain.  Gastrointestinal: Negative for abdominal pain, constipation, diarrhea, nausea and vomiting.  Genitourinary: Negative for dysuria and hematuria.  Musculoskeletal: Negative for arthralgias and back pain.  Skin: Negative for rash.  Neurological: Negative for headaches.  All other systems reviewed and are negative.   Physical Exam Updated Vital Signs BP (!) 165/126    Pulse 94    Temp 98.5 F (36.9 C) (Oral)    Resp 19    Ht 5\' 6"  (1.676 m)    Wt 78 kg    SpO2 100%    BMI 27.75 kg/m   Physical Exam Vitals and nursing note reviewed.  Constitutional:      General: She is not in acute distress.    Appearance: She is well-developed.  HENT:     Head: Normocephalic and atraumatic.  Eyes:     Conjunctiva/sclera: Conjunctivae normal.  Cardiovascular:     Rate and Rhythm: Normal rate and regular rhythm.     Heart sounds: No murmur heard.   Pulmonary:     Effort: Pulmonary effort is normal. No respiratory distress.     Breath sounds: Normal breath sounds. No  decreased breath sounds, wheezing, rhonchi or rales.  Abdominal:     Palpations: Abdomen is soft.     Tenderness: There is no abdominal tenderness.  Musculoskeletal:     Cervical back: Neck supple.     Comments: Trace ble edema  Skin:    General: Skin is warm and dry.  Neurological:     Mental Status: She is alert.     ED Results / Procedures / Treatments   Labs (all labs ordered are listed, but only abnormal results are displayed) Labs Reviewed  BASIC METABOLIC PANEL - Abnormal; Notable for the following components:      Result Value   Creatinine, Ser 1.24 (*)    Calcium 8.5 (*)    GFR calc non  Af Amer 50 (*)    GFR calc Af Amer 58 (*)    All other components within normal limits  BRAIN NATRIURETIC PEPTIDE - Abnormal; Notable for the following components:   B Natriuretic Peptide 2,075.5 (*)    All other components within normal limits  SARS CORONAVIRUS 2 BY RT PCR (HOSPITAL ORDER, PERFORMED IN [facility])  CBC  I-STAT BETA HCG BLOOD, ED ([facility], [facility], [facility] ONLY)  TROPONIN I (HIGH SENSITIVITY)  TROPONIN I (HIGH SENSITIVITY)    EKG EKG Interpretation  Date/Time:  Thursday September 28 2019 12:29:29 EDT Ventricular Rate:  99 PR Interval:  184 QRS Duration: 78 QT Interval:  366 QTC Calculation: 469 R Axis:   67 Text Interpretation: Normal sinus rhythm Anterior infarct , age undetermined Abnormal ECG When compared to prior, similar apperance. No STEMI Confirmed by Theda Belfast ([id number]) on 09/28/2019 2:10:33 PM   Radiology DG Chest 2 View  Result Date: 09/28/2019 CLINICAL DATA:  Chest pain shortness of breath EXAM: CHEST - 2 VIEW COMPARISON:  April 22, 2017 FINDINGS: The cardiomediastinal silhouette is unchanged and enlarged in contour. No pleural effusion. No pneumothorax. Mild diffuse interstitial prominence and peribronchial cuffing. Perihilar vascular prominence with cephalization of the vasculature. No acute osseous abnormality. Unchanged minimal wedging of an  inferior thoracic vertebral body. Visualized abdomen is unremarkable. IMPRESSION: Findings suggestive of mild pulmonary edema. Electronically Signed   By: Meda Klinefelter MD   On: 09/28/2019 12:54    Procedures Procedures (including critical care time)  Medications Ordered in ED Medications  acetaminophen (TYLENOL) tablet 1,000 mg (1,000 mg Oral Given 09/28/19 1530)  furosemide (LASIX) injection 40 mg (40 mg Intravenous Given 09/28/19 1531)    ED Course  I have reviewed the triage vital signs and the nursing notes.  Pertinent labs & imaging results that were available during my care of the patient were reviewed by me and considered in my medical decision making (see chart for details).    MDM Rules/Calculators/A&P                          49 year old female presenting for evaluation of orthopnea.  She is also had some URI symptoms for the last several days.  On evaluation she is found to be febrile..  The remainder of her vital signs are reassuring other than her hypertension which she attributes to taking Robitussin.  Reviewed/interpreted labs CBC is without leukocytosis or anemia BMP with normal electrolytes.  Mild elevation in creatinine significant AKI. Initial troponin neck BNP is elevated over 2000  - pt does appear to have a mild chf exacerbation with trace ble edema and mild pulm edema on cxr.  Beta-hCG is negative COVID negative   EKG Normal sinus rhythm Anterior infarct , age undetermined Abnormal ECG When compared to prior, similar apperance. No STEMI  CXR reviewed/interpreted - consistent with mild pulm edema  Patient was given Tylenol for her for her fever.  She was also given a dose of IV Lasix in the ED.  She was ambulated with pulse oximetry monitoring and sats remained from 94-100% on RA.  I gave patient 40 mg IV Lasix and on reassessment she states that her breathing is now improved.  She has had a good amount of urinary output since receiving the medication and  feels back to baseline.  We discussed that she will need to have close follow-up with her cardiologist and she is agreeable to this.  Due to  her fever and upper respiratory symptoms, I will cover her with doxycycline to cover possible bacterial sinusitis and also cover possible atypical pneumonia.  Have advised close monitoring of symptoms and strict return precautions.  Pt seen in conjunction with Dr. Renaye Rakers who personally evaluated the patient and is in agreement with the plan.   Final Clinical Impression(s) / ED Diagnoses Final diagnoses:  Acute on chronic congestive heart failure, unspecified heart failure type (HCC)  Upper respiratory tract infection, unspecified type    Rx / DC Orders ED Discharge Orders         Ordered    doxycycline (VIBRAMYCIN) 100 MG capsule  2 times daily     Discontinue  Reprint     09/28/19 1737    fluticasone (FLONASE) 50 MCG/ACT nasal spray  Daily     Discontinue  Reprint     09/28/19 1737    benzonatate (TESSALON) 100 MG capsule  Every 8 hours     Discontinue  Reprint     09/28/19 1737           Crestina Strike, Eather Colas, PA-C 09/28/19 1802    Terald Sleeper, MD 09/30/19 [phone]

## 2019-09-28 NOTE — ED Notes (Signed)
Pt ambulatory in room, sats 94-100% on room air

## 2019-10-02 ENCOUNTER — Other Ambulatory Visit: Payer: Self-pay

## 2019-10-02 ENCOUNTER — Telehealth (HOSPITAL_COMMUNITY): Payer: Self-pay | Admitting: *Deleted

## 2019-10-02 ENCOUNTER — Encounter (HOSPITAL_COMMUNITY): Payer: Self-pay

## 2019-10-02 ENCOUNTER — Emergency Department (HOSPITAL_COMMUNITY)
Admission: EM | Admit: 2019-10-02 | Discharge: 2019-10-02 | Disposition: A | Discharge status: Home / Self Care | Payer: BC Managed Care – PPO | Attending: Emergency Medicine | Admitting: Emergency Medicine

## 2019-10-02 ENCOUNTER — Emergency Department (HOSPITAL_COMMUNITY): Payer: BC Managed Care – PPO

## 2019-10-02 DIAGNOSIS — Z79899 Other long term (current) drug therapy: Secondary | ICD-10-CM | POA: Diagnosis not present

## 2019-10-02 DIAGNOSIS — J45909 Unspecified asthma, uncomplicated: Secondary | ICD-10-CM | POA: Diagnosis not present

## 2019-10-02 DIAGNOSIS — Z7982 Long term (current) use of aspirin: Secondary | ICD-10-CM | POA: Diagnosis not present

## 2019-10-02 DIAGNOSIS — I10 Essential (primary) hypertension: Secondary | ICD-10-CM | POA: Diagnosis not present

## 2019-10-02 DIAGNOSIS — I5023 Acute on chronic systolic (congestive) heart failure: Secondary | ICD-10-CM | POA: Insufficient documentation

## 2019-10-02 DIAGNOSIS — I2510 Atherosclerotic heart disease of native coronary artery without angina pectoris: Secondary | ICD-10-CM | POA: Diagnosis not present

## 2019-10-02 DIAGNOSIS — R0602 Shortness of breath: Secondary | ICD-10-CM | POA: Diagnosis present

## 2019-10-02 LAB — I-STAT BETA HCG BLOOD, ED (MC, WL, AP ONLY): I-stat hCG, quantitative: <5 m[IU]/mL (ref <5)

## 2019-10-02 LAB — BASIC METABOLIC PANEL
Anion gap: 11 (ref 5–15)
BUN: 5 mg/dL — ABNORMAL LOW (ref 6–20)
CO2: 24 mmol/L (ref 22–32)
Calcium: 8.4 mg/dL — ABNORMAL LOW (ref 8.9–10.3)
Chloride: 104 mmol/L (ref 98–111)
Creatinine, Ser: 1.09 mg/dL — ABNORMAL HIGH (ref 0.44–1.00)
GFR calc Af Amer: >60 mL/min (ref >60)
GFR calc non Af Amer: 59 mL/min — ABNORMAL LOW (ref >60)
Glucose, Bld: 97 mg/dL (ref 70–99)
Potassium: 3.7 mmol/L (ref 3.5–5.1)
Sodium: 139 mmol/L (ref 135–145)

## 2019-10-02 LAB — CBC
HCT: 46 % (ref 36.0–46.0)
Hemoglobin: 15.1 g/dL — ABNORMAL HIGH (ref 12.0–15.0)
MCH: 28.7 pg (ref 26.0–34.0)
MCHC: 32.8 g/dL (ref 30.0–36.0)
MCV: 87.5 fL (ref 80.0–100.0)
Platelets: 201 10*3/uL (ref 150–400)
RBC: 5.26 MIL/uL — ABNORMAL HIGH (ref 3.87–5.11)
RDW: 12.7 % (ref 11.5–15.5)
WBC: 4.2 10*3/uL (ref 4.0–10.5)
nRBC: 0 % (ref 0.0–0.2)

## 2019-10-02 LAB — TROPONIN I (HIGH SENSITIVITY): Troponin I (High Sensitivity): 8 ng/L (ref <18)

## 2019-10-02 LAB — BRAIN NATRIURETIC PEPTIDE: B Natriuretic Peptide: 525.4 pg/mL — ABNORMAL HIGH (ref 0.0–100.0)

## 2019-10-02 MED ORDER — FUROSEMIDE 10 MG/ML IJ SOLN
40.0000 mg | Freq: Once | INTRAMUSCULAR | Status: AC
  Administered 2019-10-02: 40 mg via INTRAVENOUS
  Filled 2019-10-02: qty 4

## 2019-10-02 MED ORDER — SODIUM CHLORIDE 0.9% FLUSH
3.0000 mL | Freq: Once | INTRAVENOUS | Status: AC
  Administered 2019-10-02: 3 mL via INTRAVENOUS

## 2019-10-02 NOTE — Telephone Encounter (Signed)
That would be fine 

## 2019-10-02 NOTE — Discharge Instructions (Signed)
Please read and follow all provided instructions.  Your diagnoses today include:  1. Acute on chronic systolic congestive heart failure (HCC)     Tests performed today include:  An EKG of your heart  A chest x-ray  Cardiac enzymes - a blood test for heart muscle damage  Blood counts and electrolytes  BNP - blood test for heart failure severity, improving today  Vital signs. See below for your results today.   Medications prescribed:   None  Take any prescribed medications only as directed.  Follow-up instructions: Please follow-up with your cardiologist this week.   Return instructions:  SEEK IMMEDIATE MEDICAL ATTENTION IF:  You have severe chest pain, especially if the pain is crushing or pressure-like and spreads to the arms, back, neck, or jaw, or if you have sweating, nausea (feeling sick to your stomach), or shortness of breath. THIS IS AN EMERGENCY. Don't wait to see if the pain will go away. Get medical help at once. Call 911 or 0 (operator). DO NOT drive yourself to the hospital.   Your chest pain gets worse and does not go away with rest.   You have an attack of chest pain lasting longer than usual, despite rest and treatment with the medications your caregiver has prescribed.   You wake from sleep with chest pain or shortness of breath.  You feel dizzy or faint.  You have chest pain not typical of your usual pain for which you originally saw your caregiver.   You have any other emergent concerns regarding your health.   Your vital signs today were: BP (!) 170/116 (BP Location: Left Arm)   Pulse 91   Temp (!) 97.4 F (36.3 C) (Oral)   Resp (!) 23   SpO2 98%  If your blood pressure (BP) was elevated above 135/85 this visit, please have this repeated by your doctor within one month. --------------

## 2019-10-02 NOTE — ED Notes (Signed)
Patient verbalizes understanding of discharge instructions. Opportunity for questioning and answers were provided. Armband removed by staff, pt discharged from ED ambulatory to home.  

## 2019-10-02 NOTE — ED Notes (Signed)
Pt O2 remained between 96 and 97% while ambulating.

## 2019-10-02 NOTE — Telephone Encounter (Signed)
Left VM requesting pt return call to get specifics on what letter needs to state.

## 2019-10-02 NOTE — Telephone Encounter (Signed)
Take Lasix 40 mg daily x 3 days then 20 mg daily after that (she should be taking Lasix 20 mg every other day right now).  Will need BMET in 1 week and followup with NP/PA soon.

## 2019-10-02 NOTE — ED Triage Notes (Addendum)
Pt reports SOB that worsens with activity. States she was here Thursday for the same, called her heart dr and they are unable to see her. Pt reports SOB is worse at night, sleeping on multiple pillows but as soon as she falls asleep it feels like something wet comes over her chest, she has to jump up because she feels like she cant breathe. Pt taking the prescribed cough syrup with clear productive cough. Pt also taking her fluid pill with no relief

## 2019-10-02 NOTE — Telephone Encounter (Signed)
Pt left VM stating she was seen in the ED (see note below) and is still not feeling well pt said she can not breathe while lying down. Pt states chest pain has resolved but she is concerned about fluid.   Routed to Dr.McLean for advice   "49 year old female presenting for evaluation of orthopnea.  She is also had some URI symptoms for the last several days.  On evaluation she is found to be febrile..  The remainder of her vital signs are reassuring other than her hypertension which she attributes to taking Robitussin.  Reviewed/interpreted labs CBC is without leukocytosis or anemia BMP with normal electrolytes.  Mild elevation in creatinine significant AKI. Initial troponin neck BNP is elevated over 2000             - pt does appear to have a mild chf exacerbation with trace ble edema and mild pulm edema on cxr.  Beta-hCG is negative COVID negative   EKG Normal sinus rhythm Anterior infarct , age undetermined Abnormal ECG When compared to prior, similar apperance. No STEMI  CXR reviewed/interpreted - consistent with mild pulm edema  Patient was given Tylenol for her for her fever.  She was also given a dose of IV Lasix in the ED.  She was ambulated with pulse oximetry monitoring and sats remained from 94-100% on RA.  I gave patient 40 mg IV Lasix and on reassessment she states that her breathing is now improved.  She has had a good amount of urinary output since receiving the medication and feels back to baseline.  We discussed that she will need to have close follow-up with her cardiologist and she is agreeable to this.  Due to her fever and upper respiratory symptoms, I will cover her with doxycycline to cover possible bacterial sinusitis and also cover possible atypical pneumonia.  Have advised close monitoring of symptoms and strict return precautions.  Pt seen in conjunction with Dr. Renaye Rakers who personally evaluated the patient and is in agreement with the plan.   Final Clinical  Impression(s) / ED Diagnoses Final diagnoses:  Acute on chronic congestive heart failure, unspecified heart failure type (HCC)  Upper respiratory tract infection, unspecified type    Rx / DC Orders    ED Discharge Orders               Ordered     doxycycline (VIBRAMYCIN) 100 MG capsule  2 times daily     Discontinue  Reprint     09/28/19 1737     fluticasone (FLONASE) 50 MCG/ACT nasal spray  Daily     Discontinue  Reprint     09/28/19 1737     benzonatate (TESSALON) 100 MG capsule  Every 8 hours     Discontinue  Reprint     09/28/19 1737            Couture, Cortni S, PA-C 09/28/19 1802

## 2019-10-02 NOTE — ED Provider Notes (Signed)
[facility] EMERGENCY DEPARTMENT Provider Note   CSN: [id number] Arrival date & time: 10/02/19  1146     History Chief Complaint  Patient presents with  . Shortness of Breath    Pamela Brooks is a 49 y.o. female.  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  Patient was seen in the emergency department on 09/28/2019 and was given a dose of Lasix with clinical improvement.  She was also started on an antibiotic which she has been taking.  She states that she felt improved after diuresis in the emergency department but over the past 3 days her breathing has become worse.  She notices this more at night when she tries to lie flat and when she is active.  For instance she gets short of breath walking across the room or when changing into a gown.  She denies fevers.  She feels like she has some wheezing.  She does endorse a cough productive of clear sputum.  No nausea, vomiting or diarrhea.  She denies lower extremity edema.  She reports good urine output with Lasix at home.        Past Medical History:  Diagnosis Date  . Asthma   . CHF (congestive heart failure) (HCC)   . GERD (gastroesophageal reflux disease)   . HTN (hypertension)   . NICM (nonischemic cardiomyopathy) [facility])     Patient Active Problem List   Diagnosis Date Noted  . Chest pain 03/18/2015  . Near syncope 03/18/2015  . Pain in the chest   . Chronic systolic CHF (congestive heart failure) (HCC) 12/25/2014  . CAD (coronary artery disease) 12/25/2014  . Congestive dilated cardiomyopathy (HCC)   . Non-ischemic cardiomyopathy (HCC)   . Acute CHF (congestive heart failure) (HCC) 08/04/2014  . SOB (shortness of breath) 08/04/2014  . HTN (hypertension) 08/04/2014  . CHF (congestive heart failure) (HCC) 08/04/2014  . Asthma     Past Surgical History:  Procedure Laterality Date  . CARDIAC  CATHETERIZATION N/A 08/06/2014   Procedure: Left Heart Cath and Coronary Angiography;  Surgeon: Marykay Lex, MD;  Location: [facility] INVASIVE CV LAB;  Service: Cardiovascular;  Laterality: N/A;  . TEE WITHOUT CARDIOVERSION N/A 04/28/2019   Procedure: TRANSESOPHAGEAL ECHOCARDIOGRAM (TEE);  Surgeon: Laurey Morale, MD;  Location: [facility] ENDOSCOPY;  Service: Cardiovascular;  Laterality: N/A;     OB History   No obstetric history on file.     Family History  Problem Relation Age of Onset  . Hypertension Mother   . Hypertension Father   . Stroke Father   . Throat cancer Father   . Cancer Father     Social History   Tobacco Use  . Smoking status: Never Smoker  . Smokeless tobacco: Never Used  Substance Use Topics  . Alcohol use: No    Alcohol/week: 0.0 standard drinks  . Drug use: No    Home Medications Prior to Admission medications   Medication Sig Start Date End Date Taking? Authorizing Provider  acetaminophen (TYLENOL) 500 MG tablet Take 500 mg by mouth every 6 (six) hours as needed for mild pain.    [provider]  aspirin EC 81 MG EC tablet Take 1 tablet (81 mg total) by mouth daily. 08/07/14   Clydia Llano, MD  atorvastatin (LIPITOR) 40 MG tablet Take 1 tablet (40 mg total) by mouth daily. 03/23/19 09/28/19  Sherald Hess, NP  benzonatate (TESSALON) 100 MG capsule Take 1 capsule (100 mg total) by mouth every 8 (eight) hours for 5 days. 09/28/19 10/03/19  Couture, Cortni S, PA-C  carvedilol (COREG) 25 MG tablet Take 1 tablet (25 mg total) by mouth 2 (two) times daily with a meal. 07/25/19   Laurey Morale, MD  dapagliflozin propanediol (FARXIGA) 10 MG TABS tablet Take 1 tablet (10 mg total) by mouth daily before breakfast. Patient not taking: Reported on 09/28/2019 08/29/19   Laurey Morale, MD  doxycycline (VIBRAMYCIN) 100 MG capsule Take 1 capsule (100 mg total) by mouth 2 (two) times daily for 7 days. 09/28/19 10/05/19  Couture, Cortni S, PA-C  fluticasone (FLONASE) 50 MCG/ACT  nasal spray Place 2 sprays into both nostrils daily. 09/28/19   Couture, Cortni S, PA-C  furosemide (LASIX) 20 MG tablet Take 1 tablet (20 mg total) by mouth every other day. 03/20/19 09/28/19  Clegg, Amy D, NP  sacubitril-valsartan (ENTRESTO) 97-103 MG Take 1 tablet by mouth 2 (two) times daily. 03/20/19   Clegg, Amy D, NP  spironolactone (ALDACTONE) 25 MG tablet Take 1 tablet (25 mg total) by mouth daily. 08/29/19   Laurey Morale, MD    Allergies    Tramadol  Review of Systems   Review of Systems  Constitutional: Negative for fever.  HENT: Negative for rhinorrhea and sore throat.   Eyes: Negative for redness.  Respiratory: Positive for cough, shortness of breath and wheezing.   Cardiovascular: Negative for chest pain and leg swelling.  Gastrointestinal: Negative for abdominal pain, diarrhea, nausea and vomiting.  Genitourinary: Negative for dysuria, frequency, hematuria and urgency.  Musculoskeletal: Negative for myalgias.  Skin: Negative for rash.  Neurological: Negative for headaches.    Physical Exam Updated Vital Signs BP (!) 159/115 (BP Location: Right Arm)   Pulse 88   Temp (!) 97.4 F (36.3 C) (Oral)   Resp 20   SpO2 99%   Physical Exam Vitals and nursing note reviewed.  Constitutional:      General: She is not in acute distress.    Appearance: She is well-developed.  HENT:     Head: Normocephalic and atraumatic.     Right Ear: External ear normal.     Left Ear: External ear normal.     Nose: Nose normal.  Eyes:     Conjunctiva/sclera: Conjunctivae normal.  Neck:     Vascular: No JVD.  Cardiovascular:     Rate and Rhythm: Normal rate and regular rhythm.     Heart sounds: No murmur heard.   Pulmonary:     Effort: No respiratory distress.     Breath sounds: Examination of the right-middle field reveals rales. Examination of the left-middle field reveals rales. Examination of the right-lower field reveals rales. Examination of the left-lower field reveals rales.  Rales present. No wheezing or rhonchi.  Abdominal:     Palpations: Abdomen is soft.     Tenderness: There is no abdominal tenderness. There is no guarding or rebound.  Musculoskeletal:     Cervical back: Normal range of motion and neck supple.     Right lower leg: No edema.     Left lower leg: No edema.  Skin:    General: Skin is warm and dry.     Findings: No rash.  Neurological:     General: No focal deficit present.     Mental Status: She is alert. Mental status is at baseline.     Motor: No weakness.  Psychiatric:  Mood and Affect: Mood normal.     ED Results / Procedures / Treatments   Labs (all labs ordered are listed, but only abnormal results are displayed) Labs Reviewed  BASIC METABOLIC PANEL - Abnormal; Notable for the following components:      Result Value   BUN 5 (*)    Creatinine, Ser 1.09 (*)    Calcium 8.4 (*)    GFR calc non Af Amer 59 (*)    All other components within normal limits  CBC - Abnormal; Notable for the following components:   RBC 5.26 (*)    Hemoglobin 15.1 (*)    All other components within normal limits  BRAIN NATRIURETIC PEPTIDE - Abnormal; Notable for the following components:   B Natriuretic Peptide 525.4 (*)    All other components within normal limits  I-STAT BETA HCG BLOOD, ED ([facility], [facility], [facility] ONLY)  TROPONIN I (HIGH SENSITIVITY)    ED ECG REPORT   Date: 10/02/2019  Rate: 86  Rhythm: normal sinus rhythm  QRS Axis: normal  Intervals: normal  ST/T Wave abnormalities: normal  Conduction Disutrbances:none  Narrative Interpretation:   Old EKG Reviewed: unchanged  I have personally reviewed the EKG tracing and agree with the computerized printout as noted.  Radiology DG Chest 2 View  Result Date: 10/02/2019 CLINICAL DATA:  Cough and increased shortness of breath. History of asthma. EXAM: CHEST - 2 VIEW COMPARISON:  09/28/2019 FINDINGS: Stable enlarged cardiac silhouette. The lungs remain clear with mildly prominent pulmonary  vasculature and interstitial markings. No pleural fluid. Unremarkable bones. IMPRESSION: Stable cardiomegaly, mild pulmonary vascular congestion and mild chronic interstitial lung disease versus mild interstitial pulmonary edema. Electronically Signed   By: Beckie Salts M.D.   On: 10/02/2019 13:09    Procedures Procedures (including critical care time)  Medications Ordered in ED Medications  sodium chloride flush (NS) 0.9 % injection 3 mL (3 mLs Intravenous Given 10/02/19 1815)  furosemide (LASIX) injection 40 mg (40 mg Intravenous Given 10/02/19 1815)    ED Course  I have reviewed the triage vital signs and the nursing notes.  Pertinent labs & imaging results that were available during my care of the patient were reviewed by me and considered in my medical decision making (see chart for details).  Patient seen and examined. Pt with heart failure exacerbation.  Work-up reviewed.  Would like to compare BNP to previous, ambulate patient, give dose of Lasix and reassess symptoms.  She did call her cardiologist this morning.  She was instructed to double her dose of Lasix over the next 3 days and plan for short-term outpatient follow-up, but was symptomatic to the point where she decided to come to the ED.  Vital signs reviewed and are as follows: BP (!) 159/115 (BP Location: Right Arm)   Pulse 88   Temp (!) 97.4 F (36.3 C) (Oral)   Resp 20   SpO2 99%   Covid testing on 09/28/2019 was negative.  6:07 PM EKG reviewed.   7:23 PM BNP improved. Patient with good UOP here.  I discussed lab results with patient at bedside.  She is comfortable discharged home.  She will double Lasix dose starting tomorrow for 3 days and follow-up with cardiology.  I will send them a note as well.  Encouraged to return with worsening symptoms, more severe difficulty breathing, chest pain, fevers or other concerns.    MDM Rules/Calculators/A&P  Patient with suspected heart failure  exacerbation given reported orthopnea, basilar rales, chest x-ray, elevated BNP. She is not hypoxic or tachycardic. She is not in any respiratory distress. She has ambulated and maintained normal oxygen saturation. She responded well to 40mg  IV Lasix in the ED. Her BNP has decreased from approximately 2000 days ago to 500 today. Overall symptoms are bothersome, however I think that she can be discharged home with close cardiology follow-up. She did call the office today and it appears that they are going to make her close follow-up appointment. Plan for discharged home, doubling Lasix for the next 3 days, and then 20 mg daily. Return instructions as above.    Final Clinical Impression(s) / ED Diagnoses Final diagnoses:  Acute on chronic systolic congestive heart failure [facility])    Rx / DC Orders ED Discharge Orders    None       [facility], Renne Crigler 10/02/19 1933    12/02/19, MD 10/02/19 2147

## 2019-10-02 NOTE — Telephone Encounter (Signed)
Pt aware and agreeable with plan. Pt said she can't work right now because she is weak she asks that we write her a note out of work until her follow up appt. I told pt I would follow up with Dr.McLean and call her back if he approves of writing her out of work

## 2019-11-28 ENCOUNTER — Encounter (HOSPITAL_COMMUNITY): Payer: BC Managed Care – PPO

## 2020-08-14 ENCOUNTER — Emergency Department (HOSPITAL_COMMUNITY)
Admission: EM | Admit: 2020-08-14 | Discharge: 2020-08-14 | Disposition: A | Discharge status: Home / Self Care | Payer: BC Managed Care – PPO | Attending: Emergency Medicine | Admitting: Emergency Medicine

## 2020-08-14 ENCOUNTER — Other Ambulatory Visit: Payer: Self-pay

## 2020-08-14 ENCOUNTER — Emergency Department (HOSPITAL_COMMUNITY): Payer: BC Managed Care – PPO

## 2020-08-14 ENCOUNTER — Encounter (HOSPITAL_COMMUNITY): Payer: Self-pay | Admitting: *Deleted

## 2020-08-14 DIAGNOSIS — Z7982 Long term (current) use of aspirin: Secondary | ICD-10-CM | POA: Diagnosis not present

## 2020-08-14 DIAGNOSIS — R0789 Other chest pain: Secondary | ICD-10-CM

## 2020-08-14 DIAGNOSIS — Z79899 Other long term (current) drug therapy: Secondary | ICD-10-CM | POA: Diagnosis not present

## 2020-08-14 DIAGNOSIS — I2510 Atherosclerotic heart disease of native coronary artery without angina pectoris: Secondary | ICD-10-CM | POA: Insufficient documentation

## 2020-08-14 DIAGNOSIS — I110 Hypertensive heart disease with heart failure: Secondary | ICD-10-CM | POA: Diagnosis not present

## 2020-08-14 DIAGNOSIS — J45909 Unspecified asthma, uncomplicated: Secondary | ICD-10-CM | POA: Insufficient documentation

## 2020-08-14 DIAGNOSIS — R0602 Shortness of breath: Secondary | ICD-10-CM | POA: Diagnosis not present

## 2020-08-14 DIAGNOSIS — I5022 Chronic systolic (congestive) heart failure: Secondary | ICD-10-CM | POA: Diagnosis not present

## 2020-08-14 LAB — CBC WITH DIFFERENTIAL/PLATELET
Abs Immature Granulocytes: 0.01 10*3/uL (ref 0.00–0.07)
Basophils Absolute: 0 10*3/uL (ref 0.0–0.1)
Basophils Relative: 1 %
Eosinophils Absolute: 0 10*3/uL (ref 0.0–0.5)
Eosinophils Relative: 1 %
HCT: 43.9 % (ref 36.0–46.0)
Hemoglobin: 13.9 g/dL (ref 12.0–15.0)
Immature Granulocytes: 0 %
Lymphocytes Relative: 11 %
Lymphs Abs: 0.5 10*3/uL — ABNORMAL LOW (ref 0.7–4.0)
MCH: 28.7 pg (ref 26.0–34.0)
MCHC: 31.7 g/dL (ref 30.0–36.0)
MCV: 90.7 fL (ref 80.0–100.0)
Monocytes Absolute: 0.6 10*3/uL (ref 0.1–1.0)
Monocytes Relative: 12 %
Neutro Abs: 3.7 10*3/uL (ref 1.7–7.7)
Neutrophils Relative %: 75 %
Platelets: 181 10*3/uL (ref 150–400)
RBC: 4.84 MIL/uL (ref 3.87–5.11)
RDW: 13 % (ref 11.5–15.5)
WBC: 4.8 10*3/uL (ref 4.0–10.5)
nRBC: 0 % (ref 0.0–0.2)

## 2020-08-14 LAB — BASIC METABOLIC PANEL
Anion gap: 5 (ref 5–15)
BUN: 16 mg/dL (ref 6–20)
CO2: 23 mmol/L (ref 22–32)
Calcium: 8.6 mg/dL — ABNORMAL LOW (ref 8.9–10.3)
Chloride: 109 mmol/L (ref 98–111)
Creatinine, Ser: 1.38 mg/dL — ABNORMAL HIGH (ref 0.44–1.00)
GFR, Estimated: 46 mL/min — ABNORMAL LOW (ref >60)
Glucose, Bld: 147 mg/dL — ABNORMAL HIGH (ref 70–99)
Potassium: 3.6 mmol/L (ref 3.5–5.1)
Sodium: 137 mmol/L (ref 135–145)

## 2020-08-14 LAB — BRAIN NATRIURETIC PEPTIDE: B Natriuretic Peptide: 1780 pg/mL — ABNORMAL HIGH (ref 0.0–100.0)

## 2020-08-14 LAB — TROPONIN I (HIGH SENSITIVITY)
Troponin I (High Sensitivity): 7 ng/L (ref <18)
Troponin I (High Sensitivity): 8 ng/L (ref <18)

## 2020-08-14 MED ORDER — FUROSEMIDE 10 MG/ML IJ SOLN
40.0000 mg | Freq: Once | INTRAMUSCULAR | Status: AC
  Administered 2020-08-14: 12:00:00 40 mg via INTRAVENOUS
  Filled 2020-08-14: qty 4

## 2020-08-14 NOTE — ED Notes (Signed)
Pt report not taking any of her medications or fluid pills.

## 2020-08-14 NOTE — ED Provider Notes (Signed)
[facility] EMERGENCY DEPARTMENT Provider Note   CSN: [id number] Arrival date & time: 08/14/20  0940     History Chief Complaint  Patient presents with   Chest Pain    Pamela Brooks is a 50 y.o. female.  Patient complains of some chest discomfort and shortness of breath.  She is feeling no pain now.  She has a history of cardiomyopathy and heart failure.  The history is provided by the patient and medical records. No language interpreter was used.  Chest Pain Pain location:  L chest Pain quality: aching   Pain radiates to:  Does not radiate Pain severity:  Mild Onset quality:  Sudden Timing:  Intermittent Progression:  Improving Chronicity:  New Context: not drug use   Relieved by:  Nothing Associated symptoms: no abdominal pain, no back pain, no cough, no fatigue and no headache       Past Medical History:  Diagnosis Date   Asthma    CHF (congestive heart failure) (HCC)    GERD (gastroesophageal reflux disease)    HTN (hypertension)    NICM (nonischemic cardiomyopathy) (HCC)     Patient Active Problem List   Diagnosis Date Noted   Chest pain 03/18/2015   Near syncope 03/18/2015   Pain in the chest    Chronic systolic CHF (congestive heart failure) (HCC) 12/25/2014   CAD (coronary artery disease) 12/25/2014   Congestive dilated cardiomyopathy (HCC)    Non-ischemic cardiomyopathy (HCC)    Acute CHF (congestive heart failure) (HCC) 08/04/2014   SOB (shortness of breath) 08/04/2014   HTN (hypertension) 08/04/2014   CHF (congestive heart failure) (HCC) 08/04/2014   Asthma     Past Surgical History:  Procedure Laterality Date   CARDIAC CATHETERIZATION N/A 08/06/2014   Procedure: Left Heart Cath and Coronary Angiography;  Surgeon: Marykay Lex, MD;  Location: [facility] INVASIVE CV LAB;  Service: Cardiovascular;  Laterality: N/A;   TEE WITHOUT CARDIOVERSION N/A 04/28/2019   Procedure: TRANSESOPHAGEAL ECHOCARDIOGRAM (TEE);  Surgeon: Laurey Morale, MD;  Location: [facility]  ENDOSCOPY;  Service: Cardiovascular;  Laterality: N/A;     OB History   No obstetric history on file.     Family History  Problem Relation Age of Onset   Hypertension Mother    Hypertension Father    Stroke Father    Throat cancer Father    Cancer Father     Social History   Tobacco Use   Smoking status: Never   Smokeless tobacco: Never  Vaping Use   Vaping Use: Never used  Substance Use Topics   Alcohol use: No    Alcohol/week: 0.0 standard drinks   Drug use: No    Home Medications Prior to Admission medications   Medication Sig Start Date End Date Taking? Authorizing Provider  acetaminophen (TYLENOL) 500 MG tablet Take 500 mg by mouth every 6 (six) hours as needed for mild pain.    [provider]  aspirin EC 81 MG EC tablet Take 1 tablet (81 mg total) by mouth daily. 08/07/14   Clydia Llano, MD  atorvastatin (LIPITOR) 40 MG tablet Take 1 tablet (40 mg total) by mouth daily. 03/23/19 09/28/19  Tonye Becket D, NP  carvedilol (COREG) 25 MG tablet Take 1 tablet (25 mg total) by mouth 2 (two) times daily with a meal. 07/25/19   Laurey Morale, MD  dapagliflozin propanediol (FARXIGA) 10 MG TABS tablet Take 1 tablet (10 mg total) by mouth daily before breakfast. Patient not taking: Reported on 09/28/2019  08/29/19   Laurey Morale, MD  fluticasone [facility]) 50 MCG/ACT nasal spray Place 2 sprays into both nostrils daily. 09/28/19   Couture, Cortni S, PA-C  furosemide (LASIX) 20 MG tablet Take 1 tablet (20 mg total) by mouth every other day. 03/20/19 09/28/19  Clegg, Amy D, NP  sacubitril-valsartan (ENTRESTO) 97-103 MG Take 1 tablet by mouth 2 (two) times daily. 03/20/19   Clegg, Amy D, NP  spironolactone (ALDACTONE) 25 MG tablet Take 1 tablet (25 mg total) by mouth daily. 08/29/19   Laurey Morale, MD    Allergies    Tramadol  Review of Systems   Review of Systems  Constitutional:  Negative for appetite change and fatigue.  HENT:  Negative for congestion, ear discharge and  sinus pressure.   Eyes:  Negative for discharge.  Respiratory:  Negative for cough.   Cardiovascular:  Positive for chest pain.  Gastrointestinal:  Negative for abdominal pain and diarrhea.  Genitourinary:  Negative for frequency and hematuria.  Musculoskeletal:  Negative for back pain.  Skin:  Negative for rash.  Neurological:  Negative for seizures and headaches.  Psychiatric/Behavioral:  Negative for hallucinations.    Physical Exam Updated Vital Signs BP (!) 149/124 (BP Location: Right Arm)   Pulse 92   Temp 99.7 F (37.6 C) (Oral)   Resp 20   Ht 5\' 2"  (1.575 m)   Wt 72.6 kg   LMP 02/23/2015   SpO2 99%   BMI 29.26 kg/m   Physical Exam Vitals and nursing note reviewed.  Constitutional:      Appearance: She is well-developed.  HENT:     Head: Normocephalic.  Eyes:     General: No scleral icterus.    Conjunctiva/sclera: Conjunctivae normal.  Neck:     Thyroid: No thyromegaly.  Cardiovascular:     Rate and Rhythm: Normal rate and regular rhythm.     Heart sounds: No murmur heard.   No friction rub. No gallop.  Pulmonary:     Breath sounds: No stridor. No wheezing or rales.  Chest:     Chest wall: No tenderness.  Abdominal:     General: There is no distension.     Tenderness: There is no abdominal tenderness. There is no rebound.  Musculoskeletal:        General: Normal range of motion.     Cervical back: Neck supple.  Lymphadenopathy:     Cervical: No cervical adenopathy.  Skin:    Findings: No erythema or rash.  Neurological:     Mental Status: She is alert and oriented to person, place, and time.     Motor: No abnormal muscle tone.     Coordination: Coordination normal.  Psychiatric:        Behavior: Behavior normal.    ED Results / Procedures / Treatments   Labs (all labs ordered are listed, but only abnormal results are displayed) Labs Reviewed  BASIC METABOLIC PANEL - Abnormal; Notable for the following components:      Result Value   Glucose,  Bld 147 (*)    Creatinine, Ser 1.38 (*)    Calcium 8.6 (*)    GFR, Estimated 46 (*)    All other components within normal limits  CBC WITH DIFFERENTIAL/PLATELET - Abnormal; Notable for the following components:   Lymphs Abs 0.5 (*)    All other components within normal limits  BRAIN NATRIURETIC PEPTIDE - Abnormal; Notable for the following components:   B Natriuretic Peptide 1,780.0 (*)  All other components within normal limits  TROPONIN I (HIGH SENSITIVITY)  TROPONIN I (HIGH SENSITIVITY)    EKG None  Radiology DG Chest Portable 1 View  Result Date: 08/14/2020 CLINICAL DATA:  Chest pain. EXAM: PORTABLE CHEST 1 VIEW COMPARISON:  10/02/2019.  09/28/2019. FINDINGS: Prominent cardiomegaly again noted. No pulmonary venous congestion. No focal infiltrate. No pleural effusion or pneumothorax. No acute bony abnormality. IMPRESSION: No prominent cardiomegaly again noted. No pulmonary venous congestion. 2.  No focal infiltrate. Electronically Signed   By: Maisie Fus  Register   On: 08/14/2020 11:32    Procedures Procedures   Medications Ordered in ED Medications  furosemide (LASIX) injection 40 mg (40 mg Intravenous Given 08/14/20 1202)    ED Course  I have reviewed the triage vital signs and the nursing notes.  Pertinent labs & imaging results that were available during my care of the patient were reviewed by me and considered in my medical decision making (see chart for details). Patient with elevated BNP.  Chest x-ray unremarkable.  Poorly controlled blood pressure but she has not taken her medicine today.  She was given Lasix IV and had significant urine output.  Troponins have been normal.  Patient without any chest discomfort now she will be discharged home and is told to take her Lasix every day and follow-up with cardiology next week and return if any problem   MDM Rules/Calculators/A&P                          Worsening of her congestive heart failure.  She will be discharged  home on increased Lasix Final Clinical Impression(s) / ED Diagnoses Final diagnoses:  Atypical chest pain    Rx / DC Orders ED Discharge Orders     None        Bethann Berkshire, MD 08/14/20 1436

## 2020-08-14 NOTE — ED Provider Notes (Signed)
Emergency Medicine Provider Triage Evaluation Note  Pamela Brooks , a 50 y.o. female  was evaluated in triage.  Patient has history of asthma, hypertension CHF, and cardiomyopathy.  Pt complains of chest pain x2 days.  She describes mild intermittent pain in her mid chest and back.  No pain at present.  She has some shortness of breath with exertion and lying supine.  She also complains of pain and a sensation of drainage from her left ear for several days.  She believes that her "allergies are stirred up" and causing drainage from her ear into her chest.  She has an occasional cough as well.  Cough has been nonproductive.  She denies any fever, chills, nasal congestion, nausea, dizziness or headache.  Review of Systems  Positive: Chest pain, dyspnea on exertion and lying supine, left ear pain and drainage. Negative: Fever, chills, abdominal pain, vomiting  Physical Exam  BP (!) 160/109 (BP Location: Left Arm)   Pulse 94   Temp 99.7 F (37.6 C) (Oral)   Resp 16   Ht 5\' 2"  (1.575 m)   Wt 72.6 kg   LMP 02/23/2015   SpO2 99%   BMI 29.26 kg/m  Gen:   Awake, no distress   Resp:  Normal effort, lungs clear to auscultation bilaterally MSK:   Moves extremities without difficulty, no peripheral edema Other:  Mild erythema of the left TM.  Visualization of the left TM limited due to cerumen  Medical Decision Making  Medically screening exam initiated at 10:42 AM.  Appropriate orders placed.  Pamela Brooks was informed that the remainder of the evaluation will be completed by another provider, this initial triage assessment does not replace that evaluation, and the importance of remaining in the ED until their evaluation is complete.  Patient here with history of CHF, hypertension and cardiomyopathy has complaint of chest pain intermittently since Monday.  She has dyspnea on exertion.  On exam today she is well-appearing denies chest pain at present.  She is hypertensive.  She will need further  evaluation in the emergency department.  She is agreeable to plan.   Tuesday, PA-C 08/14/20 1046    08/16/20, MD 08/14/20 [phone]

## 2020-08-14 NOTE — Discharge Instructions (Addendum)
Increase your Lasix so you are taking it every day.  Follow-up with your cardiologist next week.  Return if any problems

## 2020-08-14 NOTE — ED Triage Notes (Addendum)
Pt c/o mid chest and back pain after "my allergies got stirred up" on Monday. Pt c/o SOB with ambulation. Denies nausea, dizziness, lightheadedness.

## 2020-08-14 NOTE — ED Notes (Signed)
Pt ambulating to BR without difficulty 

## 2020-08-14 NOTE — ED Notes (Signed)
Pt has ambulated multiple time to BR to void.

## 2020-08-18 ENCOUNTER — Other Ambulatory Visit (HOSPITAL_COMMUNITY): Payer: Self-pay | Admitting: Adult Health

## 2021-02-06 ENCOUNTER — Other Ambulatory Visit (HOSPITAL_COMMUNITY): Payer: Self-pay | Admitting: Internal Medicine

## 2021-02-06 ENCOUNTER — Other Ambulatory Visit (HOSPITAL_COMMUNITY): Payer: Self-pay | Admitting: Cardiology

## 2021-02-06 MED ORDER — ENTRESTO 97-103 MG PO TABS: 1.0000 | ORAL_TABLET | Freq: Two times a day (BID) | ORAL | 0 refills | Status: DC

## 2021-04-21 ENCOUNTER — Other Ambulatory Visit (HOSPITAL_COMMUNITY): Payer: Self-pay | Admitting: Cardiology

## 2021-05-25 ENCOUNTER — Other Ambulatory Visit (HOSPITAL_COMMUNITY): Payer: Self-pay | Admitting: Cardiology

## 2021-06-11 ENCOUNTER — Other Ambulatory Visit (HOSPITAL_COMMUNITY): Payer: Self-pay | Admitting: Cardiology

## 2021-06-12 ENCOUNTER — Other Ambulatory Visit (HOSPITAL_COMMUNITY): Payer: Self-pay

## 2021-06-12 NOTE — Telephone Encounter (Signed)
error 

## 2021-07-07 ENCOUNTER — Encounter (HOSPITAL_COMMUNITY): Payer: Self-pay

## 2021-07-07 ENCOUNTER — Telehealth (HOSPITAL_COMMUNITY): Payer: Self-pay

## 2021-07-07 ENCOUNTER — Ambulatory Visit (HOSPITAL_COMMUNITY)
Admission: RE | Admit: 2021-07-07 | Discharge: 2021-07-07 | Disposition: A | Discharge status: Home / Self Care | Payer: Managed Care, Other (non HMO) | Source: Ambulatory Visit | Attending: Family Medicine | Admitting: Family Medicine

## 2021-07-07 VITALS — BP 168/110 | HR 85 | Wt 190.4 lb

## 2021-07-07 DIAGNOSIS — R609 Edema, unspecified: Secondary | ICD-10-CM | POA: Insufficient documentation

## 2021-07-07 DIAGNOSIS — I110 Hypertensive heart disease with heart failure: Secondary | ICD-10-CM | POA: Diagnosis not present

## 2021-07-07 DIAGNOSIS — I052 Rheumatic mitral stenosis with insufficiency: Secondary | ICD-10-CM | POA: Insufficient documentation

## 2021-07-07 DIAGNOSIS — R0600 Dyspnea, unspecified: Secondary | ICD-10-CM | POA: Diagnosis not present

## 2021-07-07 DIAGNOSIS — I2510 Atherosclerotic heart disease of native coronary artery without angina pectoris: Secondary | ICD-10-CM | POA: Diagnosis not present

## 2021-07-07 DIAGNOSIS — I5022 Chronic systolic (congestive) heart failure: Secondary | ICD-10-CM | POA: Insufficient documentation

## 2021-07-07 DIAGNOSIS — I059 Rheumatic mitral valve disease, unspecified: Secondary | ICD-10-CM | POA: Diagnosis not present

## 2021-07-07 DIAGNOSIS — Z79899 Other long term (current) drug therapy: Secondary | ICD-10-CM | POA: Insufficient documentation

## 2021-07-07 DIAGNOSIS — I428 Other cardiomyopathies: Secondary | ICD-10-CM | POA: Insufficient documentation

## 2021-07-07 DIAGNOSIS — I10 Essential (primary) hypertension: Secondary | ICD-10-CM

## 2021-07-07 LAB — BASIC METABOLIC PANEL
Anion gap: 7 (ref 5–15)
BUN: 11 mg/dL (ref 6–20)
CO2: 24 mmol/L (ref 22–32)
Calcium: 8.6 mg/dL — ABNORMAL LOW (ref 8.9–10.3)
Chloride: 109 mmol/L (ref 98–111)
Creatinine, Ser: 0.97 mg/dL (ref 0.44–1.00)
GFR, Estimated: >60 mL/min (ref >60)
Glucose, Bld: 75 mg/dL (ref 70–99)
Potassium: 3.8 mmol/L (ref 3.5–5.1)
Sodium: 140 mmol/L (ref 135–145)

## 2021-07-07 LAB — BRAIN NATRIURETIC PEPTIDE: B Natriuretic Peptide: 1249.8 pg/mL — ABNORMAL HIGH (ref 0.0–100.0)

## 2021-07-07 MED ORDER — DAPAGLIFLOZIN PROPANEDIOL 10 MG PO TABS: 10.0000 mg | ORAL_TABLET | Freq: Every day | ORAL | 3 refills | Status: DC

## 2021-07-07 MED ORDER — CARVEDILOL 25 MG PO TABS: 25.0000 mg | ORAL_TABLET | Freq: Two times a day (BID) | ORAL | 3 refills | Status: DC

## 2021-07-07 MED ORDER — ENTRESTO 97-103 MG PO TABS: ORAL_TABLET | ORAL | 11 refills | Status: DC

## 2021-07-07 MED ORDER — SPIRONOLACTONE 25 MG PO TABS: 25.0000 mg | ORAL_TABLET | Freq: Every day | ORAL | 3 refills | Status: DC

## 2021-07-07 MED ORDER — FUROSEMIDE 20 MG PO TABS: 20.0000 mg | ORAL_TABLET | Freq: Every day | ORAL | 11 refills | Status: DC

## 2021-07-07 NOTE — Patient Instructions (Addendum)
START Spironolactone 25 mg, one half tab daily ?START Lasix 20 mg one tab daily ? ?Labs today ?We will only contact you if something comes back abnormal or we need to make some changes. ?Otherwise no news is good news! ? ?Labs needed in 7-10 days ? ?Your physician has requested that you regularly monitor and record your blood pressure readings at home. Please use the same machine at the same time of day to check your readings and record them to bring to your follow-up visit. ? ?Please be sure to establish with a PCP ? ?Your physician recommends that you schedule a follow-up appointment in: 2-3 weeks  in the [facility] and in 3 months with Dr Shirlee Latch and echo ? ?Your physician has requested that you have an echocardiogram. Echocardiography is a painless test that uses sound waves to create images of your heart. It provides your doctor with information about the size and shape of your heart and how well your heart?s chambers and valves are working. This procedure takes approximately one hour. There are no restrictions for this procedure. ? ?Do the following things EVERYDAY: ?Weigh yourself in the morning before breakfast. Write it down and keep it in a log. ?Take your medicines as prescribed ?Eat low salt foods--Limit salt (sodium) to 2000 mg per day.  ?Stay as active as you can everyday ?Limit all fluids for the day to less than 2 liters ? ?At the [facility], you and your health needs are our priority. As part of our continuing mission to provide you with exceptional heart care, we have created designated Provider Care Teams. These Care Teams include your primary Cardiologist (physician) and Advanced Practice Providers (APPs- Physician Assistants and Nurse Practitioners) who all work together to provide you with the care you need, when you need it.  ? ?You may see any of the following providers on your designated Care Team at your next follow up: ?Dr Arvilla Meres ?Dr  Marca Ancona ?Tonye Becket, NP ?Robbie Lis, PA ?Jessica Milford,NP ?Anna Genre, PA ?Karle Plumber, PharmD ? ? ?Please be sure to bring in all your medications bottles to every appointment.  ? ?If you have any questions or concerns before your next appointment please send [place] a message through [place] or call our office at [phone].   ? ?TO LEAVE A MESSAGE FOR THE NURSE SELECT OPTION 2, PLEASE LEAVE A MESSAGE INCLUDING: ?YOUR NAME ?DATE OF BIRTH ?CALL BACK NUMBER ?REASON FOR CALL**this is important as we prioritize the call backs ? ?YOU WILL RECEIVE A CALL BACK THE SAME DAY AS LONG AS YOU CALL BEFORE 4:00 PM ? ? ?

## 2021-07-07 NOTE — Progress Notes (Signed)
ReDS Vest / Clip - 07/07/21 1200   ? ?  ? ReDS Vest / Clip  ? Station Marker B   ? Ruler Value 35   ? ReDS Value Range High volume overload   ? ReDS Actual Value 40   ? Anatomical Comments sitting   ? ?  ?  ? ?  ? ? ?

## 2021-07-07 NOTE — Telephone Encounter (Signed)
Called and left patient a voice message to confirm/remind patient of their appointment at the [facility] on 07/07/21.  ? ? ?

## 2021-07-07 NOTE — Progress Notes (Signed)
Patient ID: Pamela Brooks, female   DOB: 22-Oct-1970, 51 y.o.   MRN: [id number] ? ?  ?[facility] Note  ? ?PCP: None  ?HF Cardiology: Dr. Shirlee Latch ? ?Pamela Brooks is a 51 y.o. with history of HTN and chronic systolic CHF presents for CHF clinic evaluation.  She went to the ER in 6/16 with dyspnea and peripheral edema. No chest pain.  No prior viral-type illness.  She was found to be in CHF, echo with EF 25-30%.  LHC was done, showing nonobstructive stenosis in the circumflex.  She was diuresed and BP controlled.   ?  ?Cardiac MRI in 1/17 showed EF improved to 55%. Admitted 1/23 -03/20/15 with hypotension and near syncope. It was discovered pt had continued her losartan on top of her Entresto. Meds helded and then resumed without losartan.  Spironolactone stopped with hypotension and improved EF. Discharge weight 167 lbs. Echo in 2/21 showed EF back down to 35-40% with restrictive posterior mitral leaflet and possible moderate mitral stenosis with moderate MR.  ? ?TEE was then done in 3/21, showing EF 45% with mild LV dilation/mild LV hypertrophy, mildly decreased RV systolic function, moderate MR, no mitral stenosis.  ?  ?Last seen 7/21.  ? ?Today she returns for HF follow up. Overall feeling fine. Has some SOB with walking fast. Works full time at [employer], shift work lead. Denies palpitations, CP, dizziness, edema, or PND/Orthopnea. Appetite ok. No fever or chills. Weight at home 170 pounds. Has been off spiro, ran out of refills.  ? ?ECG (personally reviewed): NSR 86 bpm ? ?ReDs: 40% ? ?Labs (1/21): LDL 86, HDL 40 ?Labs (2/21): K 3.7, creatinine 1.1 ?Labs (3/21): K 4, creatinine 0.98 ? ?PMH: ?1. HTN ?2. Chronic systolic CHF: Nonischemic cardiomyopathy, ?due to myocarditis versus HTN.   ?- LHC (6/16) with 50% mLCx stenosis.  Echo (6/16) with EF 25-30%, moderately dilated LV, moderate MR.   ?- Echo (10/16) with EF 30%, moderate LV dilation.  ?- cMRI  03/19/15 LVEF 55%, normal RV, no LGE, so no definitive  evidence for prior MI, myocarditis, or infiltrative disease. ?- Echo 03/2019 EF 35-40%, mitral valve restricted posterior leaflet, moderate mitral stenosis ?- TEE (3/21): EF 45%, mild LV dilation with mild LVH, mildly decreased RV systolic function, moderate MR with ERO 0.2 cm^2, no mitral stenosis, posterior mitral leaflet restriction.  ?3. CAD: LHC (6/16) with 50% mLCx stenosis.  ?4. Mitral valve disorder: TEE in 3/21 with moderate MR and restricted posterior leaflet, no Pamela.  ? ?SH: Lives with parents in [place].  Works at [employer].  Non smoker, no ETOH.  ? ?FH: Father and mother with HTN.  No CHF or CAD that she knows of.   ? ?ROS: All systems reviewed and negative except as per HPI.  ? ?Current Outpatient Medications  ?Medication Sig Dispense Refill  ? acetaminophen (TYLENOL) 500 MG tablet Take 500 mg by mouth every 6 (six) hours as needed for mild pain.    ? aspirin EC 81 MG EC tablet Take 1 tablet (81 mg total) by mouth daily. 30 tablet 0  ? atorvastatin (LIPITOR) 40 MG tablet Take 1 tablet (40 mg total) by mouth daily. 30 tablet 5  ? carvedilol (COREG) 25 MG tablet TAKE 1 TABLET BY MOUTH TWICE DAILY WITH MEALS 180 tablet 0  ? dapagliflozin propanediol (FARXIGA) 10 MG TABS tablet Take 1 tablet (10 mg total) by mouth daily before breakfast. 90 tablet 3  ? ENTRESTO 97-103 MG TAKE 1 TABLET BY  MOUTH TWICE DAILY . LAST REFILL. APPOINTMENT REQUIRED FOR FUTURE REFILLS. 60 tablet 0  ? fluticasone (FLONASE) 50 MCG/ACT nasal spray Place 2 sprays into both nostrils daily. (Patient not taking: Reported on 07/07/2021) 16 g 0  ? ?No current facility-administered medications for this encounter.  ? ?BP (!) 168/110   Pulse 85   Wt 86.4 kg (190 lb 6.4 oz)   LMP 02/23/2015   SpO2 98%   BMI 34.82 kg/m?  ? ?Wt Readings from Last 3 Encounters:  ?07/07/21 86.4 kg (190 lb 6.4 oz)  ?08/14/20 72.6 kg (160 lb)  ?09/28/19 78 kg (171 lb 15.3 oz)  ?  ?Physical Exam: ?General:  NAD. No resp difficulty ?HEENT: Normal ?Neck: Supple. JVP  7-8. Carotids 2+ bilat; no bruits. No lymphadenopathy or thryomegaly appreciated. ?Cor: PMI nondisplaced. Regular rate & rhythm. No rubs, gallops or murmurs. ?Lungs: Clear ?Abdomen: Soft, nontender, nondistended. No hepatosplenomegaly. No bruits or masses. Good bowel sounds. ?Extremities: No cyanosis, clubbing, rash, edema ?Neuro: Alert & oriented x 3, cranial nerves grossly intact. Moves all 4 extremities w/o difficulty. Affect pleasant. ? ?Assessment/Plan: ?1. Chronic systolic CHF: Nonischemic cardiomyopathy.  Cardiac MRI from 1/17 showed improvement in EF up to 55%.  Possible viral myocarditis with recovery. However, TEE in 3/21 showed EF down a bit to 45%.  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  NYHA class II, on exam she is at least mildly volume overloaded, ReDs 40%.  ?- Restart spiro 25 mg daily. BMET/BNP today, repeat BMET in 1 week. ?- Restart Lasix 20 mg daily. ?- Continue Coreg 25 mg bid.    ?- Continue Entresto 97-103 mg bid.  ?- Continue Farxiga 10 mg daily.  ?2. CAD: Moderate nonobstructive CAD on cath 2016.  No chest pain.  ?- Continue ASA 81.  ?- Continue atorvastatin 40 mg daily.   ?3. HTN: Elevated today. Has not had AM meds yet. ?- Add spiro as above. Consider [name] next. ?- I asked her to check BP at home + log, bring readings to next visit. ?4. Mitral valve disorders: TEE in 3/21 with restricted posterior mitral leaflet and moderate MR with no mitral stenosis.   ?- Repeat echo in a couple months. ? ?Follow up in 3 weeks with APP and 3 months with Dr. Shirlee Latch + echo. Consider paramedicine if medication compliance is an issue. ? ?Pamela Brooks ?07/07/2021 ? ?

## 2021-07-09 ENCOUNTER — Encounter (HOSPITAL_COMMUNITY): Payer: Self-pay | Admitting: *Deleted

## 2021-07-15 ENCOUNTER — Ambulatory Visit (HOSPITAL_COMMUNITY)
Admission: RE | Admit: 2021-07-15 | Discharge: 2021-07-15 | Disposition: A | Discharge status: Home / Self Care | Payer: Managed Care, Other (non HMO) | Source: Ambulatory Visit | Attending: Cardiology | Admitting: Cardiology

## 2021-07-15 DIAGNOSIS — I5022 Chronic systolic (congestive) heart failure: Secondary | ICD-10-CM | POA: Diagnosis present

## 2021-07-15 LAB — BASIC METABOLIC PANEL WITH GFR
Anion gap: 5 (ref 5–15)
BUN: 16 mg/dL (ref 6–20)
CO2: 24 mmol/L (ref 22–32)
Calcium: 8.8 mg/dL — ABNORMAL LOW (ref 8.9–10.3)
Chloride: 111 mmol/L (ref 98–111)
Creatinine, Ser: 1.27 mg/dL — ABNORMAL HIGH (ref 0.44–1.00)
GFR, Estimated: 51 mL/min — ABNORMAL LOW (ref >60)
Glucose, Bld: 148 mg/dL — ABNORMAL HIGH (ref 70–99)
Potassium: 3.9 mmol/L (ref 3.5–5.1)
Sodium: 140 mmol/L (ref 135–145)

## 2021-08-01 ENCOUNTER — Encounter (HOSPITAL_COMMUNITY): Payer: Managed Care, Other (non HMO)

## 2021-08-22 ENCOUNTER — Encounter (HOSPITAL_COMMUNITY): Payer: Managed Care, Other (non HMO)

## 2021-08-29 ENCOUNTER — Emergency Department (HOSPITAL_COMMUNITY): Payer: Managed Care, Other (non HMO)

## 2021-08-29 ENCOUNTER — Emergency Department (HOSPITAL_COMMUNITY)
Admission: EM | Admit: 2021-08-29 | Discharge: 2021-08-29 | Disposition: A | Discharge status: Home / Self Care | Payer: Managed Care, Other (non HMO) | Attending: Emergency Medicine | Admitting: Emergency Medicine

## 2021-08-29 ENCOUNTER — Encounter (HOSPITAL_COMMUNITY): Payer: Self-pay

## 2021-08-29 ENCOUNTER — Other Ambulatory Visit: Payer: Self-pay

## 2021-08-29 DIAGNOSIS — R079 Chest pain, unspecified: Secondary | ICD-10-CM | POA: Diagnosis present

## 2021-08-29 DIAGNOSIS — Z7982 Long term (current) use of aspirin: Secondary | ICD-10-CM | POA: Diagnosis not present

## 2021-08-29 DIAGNOSIS — I509 Heart failure, unspecified: Secondary | ICD-10-CM | POA: Insufficient documentation

## 2021-08-29 DIAGNOSIS — R051 Acute cough: Secondary | ICD-10-CM | POA: Insufficient documentation

## 2021-08-29 DIAGNOSIS — Z79899 Other long term (current) drug therapy: Secondary | ICD-10-CM | POA: Diagnosis not present

## 2021-08-29 DIAGNOSIS — I110 Hypertensive heart disease with heart failure: Secondary | ICD-10-CM | POA: Insufficient documentation

## 2021-08-29 LAB — I-STAT BETA HCG BLOOD, ED (MC, WL, AP ONLY): I-stat hCG, quantitative: <5 m[IU]/mL (ref <5)

## 2021-08-29 LAB — CBC
HCT: 43.8 % (ref 36.0–46.0)
Hemoglobin: 13.9 g/dL (ref 12.0–15.0)
MCH: 28.4 pg (ref 26.0–34.0)
MCHC: 31.7 g/dL (ref 30.0–36.0)
MCV: 89.6 fL (ref 80.0–100.0)
Platelets: 195 10*3/uL (ref 150–400)
RBC: 4.89 MIL/uL (ref 3.87–5.11)
RDW: 13.5 % (ref 11.5–15.5)
WBC: 9.2 10*3/uL (ref 4.0–10.5)
nRBC: 0 % (ref 0.0–0.2)

## 2021-08-29 LAB — BASIC METABOLIC PANEL
Anion gap: 12 (ref 5–15)
BUN: 8 mg/dL (ref 6–20)
CO2: 20 mmol/L — ABNORMAL LOW (ref 22–32)
Calcium: 8.7 mg/dL — ABNORMAL LOW (ref 8.9–10.3)
Chloride: 108 mmol/L (ref 98–111)
Creatinine, Ser: 1.05 mg/dL — ABNORMAL HIGH (ref 0.44–1.00)
GFR, Estimated: >60 mL/min (ref >60)
Glucose, Bld: 117 mg/dL — ABNORMAL HIGH (ref 70–99)
Potassium: 3.7 mmol/L (ref 3.5–5.1)
Sodium: 140 mmol/L (ref 135–145)

## 2021-08-29 LAB — BRAIN NATRIURETIC PEPTIDE: B Natriuretic Peptide: 2417.2 pg/mL — ABNORMAL HIGH (ref 0.0–100.0)

## 2021-08-29 LAB — TROPONIN I (HIGH SENSITIVITY)
Troponin I (High Sensitivity): 13 ng/L (ref <18)
Troponin I (High Sensitivity): 13 ng/L (ref <18)

## 2021-08-29 MED ORDER — BENZONATATE 100 MG PO CAPS: 100.0000 mg | ORAL_CAPSULE | Freq: Three times a day (TID) | ORAL | 0 refills | Status: DC

## 2021-08-29 MED ORDER — BENZONATATE 100 MG PO CAPS
100.0000 mg | ORAL_CAPSULE | Freq: Once | ORAL | Status: AC
  Administered 2021-08-29: 100 mg via ORAL
  Filled 2021-08-29: qty 1

## 2021-08-29 MED ORDER — FUROSEMIDE 10 MG/ML IJ SOLN
40.0000 mg | Freq: Once | INTRAMUSCULAR | Status: AC
  Administered 2021-08-29: 40 mg via INTRAVENOUS
  Filled 2021-08-29: qty 4

## 2021-08-29 NOTE — ED Notes (Signed)
Pt verbalized understanding of discharge instructions. Opportunity for questions provided.  

## 2021-08-29 NOTE — ED Notes (Signed)
Pt states she has not taken any of her morning medications (including blood pressure meds) due to being up all night coughing.

## 2021-08-29 NOTE — ED Notes (Signed)
Pt's chart accessed by this writer to print labels for phlebotomy.

## 2021-08-29 NOTE — Discharge Instructions (Addendum)
As we discussed, your work-up in the ER was reassuring for acute abnormalities.  Laboratory evaluation did show some elevation in your BNP which is a lab related to heart failure.  Given this, we gave you a dose of Lasix for management.  I also recommend that you increase your dose of Lasix to 40 mg for the next few days to ensure improvement in your symptoms.  I also recommend that you follow-up with your doctor in the next few days for further evaluation and management of your symptoms.  Have also given you a prescription for Tessalon which is a cough suppressant to help with your symptoms as well.  Return if development of any new or worsening symptoms.

## 2021-08-29 NOTE — ED Provider Notes (Signed)
[facility] EMERGENCY DEPARTMENT Provider Note   CSN: [id number] Arrival date & time: 08/29/21  8916     History  Chief Complaint  Patient presents with   Chest Pain    Pamela Brooks is a 51 y.o. female.  Patient with history of CHF and hypertension presents today with complaints of chest pain and cough. She states that same began last night with significant amount of coughing. States that hours after she started coughing she began to develop chest pain that was only present when she coughs. She states that she has also had rhinorrhea and congestion and suspected her symptoms were related to a URI. States she has been taking her Lasix as prescribed and has not had any weight gain or leg swelling. Denies any shortness of breath.  The history is provided by the patient. No language interpreter was used.  Chest Pain Associated symptoms: cough   Associated symptoms: no palpitations and no shortness of breath        Home Medications Prior to Admission medications   Medication Sig Start Date End Date Taking? Authorizing Provider  acetaminophen (TYLENOL) 500 MG tablet Take 500 mg by mouth every 6 (six) hours as needed for mild pain.    [provider]  aspirin EC 81 MG EC tablet Take 1 tablet (81 mg total) by mouth daily. 08/07/14   Clydia Llano, MD  atorvastatin (LIPITOR) 40 MG tablet Take 1 tablet (40 mg total) by mouth daily. 03/23/19 07/07/21  Clegg, Amy D, NP  carvedilol (COREG) 25 MG tablet Take 1 tablet (25 mg total) by mouth 2 (two) times daily with a meal. 07/07/21   Milford, Anderson [place], FNP  dapagliflozin propanediol (FARXIGA) 10 MG TABS tablet Take 1 tablet (10 mg total) by mouth daily before breakfast. 07/07/21   Milford, Anderson Malta, FNP  fluticasone (FLONASE) 50 MCG/ACT nasal spray Place 2 sprays into both nostrils daily. Patient not taking: Reported on 07/07/2021 09/28/19   Couture, Cortni S, PA-C  furosemide (LASIX) 20 MG tablet Take 1 tablet (20 mg total)  by mouth daily. 07/07/21 07/07/22  Jacklynn Ganong, FNP  sacubitril-valsartan (ENTRESTO) 97-103 MG TAKE 1 TABLET BY MOUTH TWICE DAILY 07/07/21   Milford, [place], FNP  spironolactone (ALDACTONE) 25 MG tablet Take 1 tablet (25 mg total) by mouth daily. 07/07/21   Jacklynn Ganong, FNP      Allergies    Tramadol    Review of Systems   Review of Systems  HENT:  Positive for congestion.   Respiratory:  Positive for cough. Negative for shortness of breath.   Cardiovascular:  Positive for chest pain. Negative for palpitations and leg swelling.  Gastrointestinal:  Negative for abdominal distention.  All other systems reviewed and are negative.   Physical Exam Updated Vital Signs BP (!) 172/125 (BP Location: Right Arm)   Pulse 99   Temp 100.1 F (37.8 C) (Oral)   Resp 16   Ht 5\' 1"  (1.549 m)   Wt 74.8 kg   LMP 02/23/2015   SpO2 98%   BMI 31.18 kg/m  Physical Exam Vitals and nursing note reviewed.  Constitutional:      General: She is not in acute distress.    Appearance: Normal appearance. She is well-developed and normal weight. She is not ill-appearing, toxic-appearing or diaphoretic.  HENT:     Head: Normocephalic and atraumatic.  Cardiovascular:     Rate and Rhythm: Normal rate and regular rhythm.     Heart  sounds: Normal heart sounds.  Pulmonary:     Effort: Pulmonary effort is normal. No respiratory distress.     Breath sounds: Normal breath sounds.  Abdominal:     Palpations: Abdomen is soft.  Musculoskeletal:        General: Normal range of motion.     Cervical back: Normal range of motion.     Right lower leg: No tenderness. No edema.     Left lower leg: No tenderness. No edema.  Skin:    General: Skin is warm and dry.  Neurological:     General: No focal deficit present.     Mental Status: She is alert.  Psychiatric:        Mood and Affect: Mood normal.        Behavior: Behavior normal.     ED Results / Procedures / Treatments   Labs (all labs  ordered are listed, but only abnormal results are displayed) Labs Reviewed  BASIC METABOLIC PANEL - Abnormal; Notable for the following components:      Result Value   CO2 20 (*)    Glucose, Bld 117 (*)    Creatinine, Ser 1.05 (*)    Calcium 8.7 (*)    All other components within normal limits  BRAIN NATRIURETIC PEPTIDE - Abnormal; Notable for the following components:   B Natriuretic Peptide 2,417.2 (*)    All other components within normal limits  CBC  I-STAT BETA HCG BLOOD, ED ([facility], [facility], [facility] ONLY)  TROPONIN I (HIGH SENSITIVITY)  TROPONIN I (HIGH SENSITIVITY)    EKG EKG Interpretation  Date/Time:  Friday August 29 2021 07:40:14 EDT Ventricular Rate:  104 PR Interval:  174 QRS Duration: 86 QT Interval:  366 QTC Calculation: 481 R Axis:   5 Text Interpretation: Sinus tachycardia with occasional and consecutive Premature ventricular complexes Possible Anterior infarct , age undetermined Abnormal ECG When compared with ECG of 07-Jul-2021 12:44, Since last tracing rate faster Confirmed by Linwood Dibbles [phone]) on 08/29/2021 11:03:17 AM  Radiology DG Chest 2 View  Result Date: 08/29/2021 CLINICAL DATA:  CP EXAM: CHEST - 2 VIEW COMPARISON:  August 14, 2020 FINDINGS: Again seen is the mild cardiomegaly and mild central pulmonary vascular congestion. Pulmonary vascular congestion appears stable to minimally increased in the interim. No consolidation or pleural effusion. The visualized skeletal structures are unremarkable. IMPRESSION: Cardiomegaly and pulmonary vascular congestion is stable to minimally increased in the interim. Electronically Signed   By: Marjo Bicker M.D.   On: 08/29/2021 08:04    Procedures Procedures    Medications Ordered in ED Medications  furosemide (LASIX) injection 40 mg (40 mg Intravenous Given 08/29/21 1123)  benzonatate (TESSALON) capsule 100 mg (100 mg Oral Given 08/29/21 1122)    ED Course/ Medical Decision Making/ A&P                           Medical Decision  Making Amount and/or Complexity of Data Reviewed Labs: ordered. Radiology: ordered.   This patient presents to the ED for concern of cough, chest pain, this involves an extensive number of treatment options, and is a complaint that carries with it a high risk of complications and morbidity.   Co morbidities that complicate the patient evaluation  CHF   Lab Tests:  I Ordered, and personally interpreted labs.  The pertinent results include:  BNP 2,400. Troponin negative. No other acute laboratory findings   Imaging Studies ordered:  I ordered imaging  studies including CXR  I independently visualized and interpreted imaging which showed  Cardiomegaly and pulmonary vascular congestion is stable to minimally increased in the interim. I agree with the radiologist interpretation   Cardiac Monitoring: / EKG:  The patient was maintained on a cardiac monitor.  I personally viewed and interpreted the cardiac monitored which showed an underlying rhythm of: no STEMI unchanged from previous   Problem List / ED Course / Critical interventions / Medication management   I ordered medication including Tessalon and IV Lasix  for cough and fluid overload  Reevaluation of the patient after these medicines showed that the patient improved I have reviewed the patients home medicines and have made adjustments as needed   Test / Admission - Considered:  Patient presents today with complaints of cough and chest pain. She is afebrile, non-toxic appearing, and in no acute distress with reassuring vital signs. Laboratory evaluation unremarkable other than BNP 2,400 and some signs of fluid overload on CXR. Given this, ordered 1 round of IV Lasix for diuresis. After this, patient states she is feeling significantly improved. Given presentation, very low suspicion for ACS or PE or any other emergent concerns. Considered admission given BNP, however patient is not hypoxic and is overall well appearing.  Therefore feel patient can manage her symptoms at home. Discussed with her increasing her Lasix to 40 mg for the next few days. Will also give prescription for Tessalon for her cough.  I have also emphasized the importance of close PCP follow-up for further evaluation and management.  Patient is understanding and amenable with plan, educated on red flag symptoms that would prompt immediate return.  Discharged in stable condition.   Final Clinical Impression(s) / ED Diagnoses Final diagnoses:  Acute cough  Congestive heart failure, unspecified HF chronicity, unspecified heart failure type (HCC)    Rx / DC Orders ED Discharge Orders          Ordered    benzonatate (TESSALON) 100 MG capsule  Every 8 hours        08/29/21 1237          An After Visit Summary was printed and given to the patient.     Vear Clock 08/29/21 1242    Linwood Dibbles, MD 08/30/21 [phone]

## 2021-08-29 NOTE — ED Triage Notes (Addendum)
Pt arrived POV from home c/o a chest pain that started after she coughed all night long. Pt states she thinks she has a chest cold.

## 2021-09-16 ENCOUNTER — Telehealth (HOSPITAL_COMMUNITY): Payer: Self-pay

## 2021-09-16 NOTE — Progress Notes (Signed)
Patient ID: Pamela Brooks, female   DOB: 1970/12/17, 51 y.o.   MRN: [id number]    Advanced Heart Failure Clinic Note   PCP: None  HF Cardiology: Dr. Shirlee Latch  Ms Flaim is a 51 y.o. with history of HTN and chronic systolic CHF presents for CHF clinic evaluation.  She went to the ER in 6/16 with dyspnea and peripheral edema. No chest pain.  No prior viral-type illness.  She was found to be in CHF, echo with EF 25-30%.  LHC was done, showing nonobstructive stenosis in the circumflex.  She was diuresed and BP controlled.     Cardiac MRI in 1/17 showed EF improved to 55%. Admitted 1/23 -03/20/15 with hypotension and near syncope. It was discovered pt had continued her losartan on top of her Entresto. Meds helded and then resumed without losartan.  Spironolactone stopped with hypotension and improved EF. Discharge weight 167 lbs. Echo in 2/21 showed EF back down to 35-40% with restrictive posterior mitral leaflet and possible moderate mitral stenosis with moderate MR.   TEE was then done in 3/21, showing EF 45% with mild LV dilation/mild LV hypertrophy, mildly decreased RV systolic function, moderate MR, no mitral stenosis.    Last seen 7/21.   Follow up 5/23, volume overloaded, REDs 40%. Lasix and spiro restarted. Advised follow up in 2-3 weeks but she cancelled her appointment twice.  Seen in ED 08/29/21 with CP and cough. Trop negative and BNP 2400, received IV lasix. Advised doubling lasix dose for a few days thereafter.  Today she returns for HF follow up. Overall feeling fine. She is SOB walking further distances on flat ground. Continues with cough, + orthopnea. Works full time at [employer], shift work lead. Denies palpitations, CP, dizziness, edema, or PND. Appetite ok. No fever or chills. She does not weigh at home.   ECG (personally reviewed): none ordered today.  ReDs: 43%  Labs (1/21): LDL 86, HDL 40 Labs (2/21): K 3.7, creatinine 1.1 Labs (3/21): K 4, creatinine 0.98 Labs (7/23): K 3.7,  creatinine 1.05  PMH: 1. HTN 2. Chronic systolic CHF: Nonischemic cardiomyopathy, ?due to myocarditis versus HTN.   - LHC (6/16) with 50% mLCx stenosis.  Echo (6/16) with EF 25-30%, moderately dilated LV, moderate MR.   - Echo (10/16) with EF 30%, moderate LV dilation.  - cMRI  03/19/15 LVEF 55%, normal RV, no LGE, so no definitive evidence for prior MI, myocarditis, or infiltrative disease. - Echo 03/2019 EF 35-40%, mitral valve restricted posterior leaflet, moderate mitral stenosis - TEE (3/21): EF 45%, mild LV dilation with mild LVH, mildly decreased RV systolic function, moderate MR with ERO 0.2 cm^2, no mitral stenosis, posterior mitral leaflet restriction.  3. CAD: LHC (6/16) with 50% mLCx stenosis.  4. Mitral valve disorder: TEE in 3/21 with moderate MR and restricted posterior leaflet, no MS.   SH: Lives with parents in [place].  Works at [employer].  Non smoker, no ETOH.   FH: Father and mother with HTN.  No CHF or CAD that she knows of.    ROS: All systems reviewed and negative except as per HPI.   Current Outpatient Medications  Medication Sig Dispense Refill   acetaminophen (TYLENOL) 500 MG tablet Take 500 mg by mouth every 6 (six) hours as needed for mild pain.     aspirin EC 81 MG EC tablet Take 1 tablet (81 mg total) by mouth daily. 30 tablet 0   atorvastatin (LIPITOR) 40 MG tablet Take 1 tablet (40 mg total)  by mouth daily. 30 tablet 5   benzonatate (TESSALON) 100 MG capsule Take 1 capsule (100 mg total) by mouth every 8 (eight) hours. 21 capsule 0   carvedilol (COREG) 25 MG tablet Take 1 tablet (25 mg total) by mouth 2 (two) times daily with a meal. 180 tablet 3   dapagliflozin propanediol (FARXIGA) 10 MG TABS tablet Take 1 tablet (10 mg total) by mouth daily before breakfast. 90 tablet 3   fluticasone (FLONASE) 50 MCG/ACT nasal spray Place 2 sprays into both nostrils daily. (Patient taking differently: Place 2 sprays into both nostrils as needed.) 16 g 0   furosemide  (LASIX) 20 MG tablet Take 1 tablet (20 mg total) by mouth daily. 30 tablet 11   sacubitril-valsartan (ENTRESTO) 97-103 MG TAKE 1 TABLET BY MOUTH TWICE DAILY 60 tablet 11   spironolactone (ALDACTONE) 25 MG tablet Take 1 tablet (25 mg total) by mouth daily. 90 tablet 3   No current facility-administered medications for this encounter.   BP (!) 148/86   Pulse 85   Wt 85.5 kg (188 lb 6.4 oz)   LMP 02/23/2015   SpO2 97%   BMI 35.60 kg/m   Wt Readings from Last 3 Encounters:  09/17/21 85.5 kg (188 lb 6.4 oz)  08/29/21 74.8 kg (165 lb)  07/07/21 86.4 kg (190 lb 6.4 oz)    Physical Exam: General:  NAD. No resp difficulty HEENT: Normal Neck: Supple. JVP 7-8. Carotids 2+ bilat; no bruits. No lymphadenopathy or thryomegaly appreciated. Cor: PMI nondisplaced. Regular rate & rhythm. No rubs, gallops or murmurs. Lungs: Clear Abdomen: Obese, nontender, nondistended. No hepatosplenomegaly. No bruits or masses. Good bowel sounds. Extremities: No cyanosis, clubbing, rash, 1+ BLE edema Neuro: Alert & oriented x 3, cranial nerves grossly intact. Moves all 4 extremities w/o difficulty. Affect pleasant.  Assessment/Plan: 1. Chronic systolic CHF: Nonischemic cardiomyopathy.  Cardiac MRI from 1/17 showed improvement in EF up to 55%.  Possible viral myocarditis with recovery. However, TEE in 3/21 showed EF down a bit to 45%.  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  NYHA class II-early III, she is volume overloaded on exam, REDs 43%. Weight up 23 lbs from last visit, I do not think this is all due to fluid and likely in part caloric. - Stop Lasix. - Start torsemide 40 mg daily + 20 KCL daily. BMET/BNP today, repeat BMET in 10 days. - Continue spiro 25 mg daily.  - Continue Coreg 25 mg bid.    - Continue Entresto 97-103 mg bid.  - Continue Farxiga 10 mg daily.  2. CAD: Moderate nonobstructive CAD on cath 2016.  No chest pain.  - Continue ASA 81.  - Continue atorvastatin 40 mg daily.   3. HTN: Elevated  today. Diurese as above. - Given Rx for BP cuff and asked to check BP daily and bring readings to next visit. - If remains elevated after diureses, add [name]. 4. Mitral valve disorders: TEE in 3/21 with restricted posterior mitral leaflet and moderate MR with no mitral stenosis.   - Repeat echo.  Follow up in 2 weeks with APP and keep appt next month with Dr. Shirlee Latch + echo.   [facility] FNP-BC 09/17/2021

## 2021-09-16 NOTE — Telephone Encounter (Signed)
Called to confirm/remind patient of their appointment at the [facility] Clinic on 09/17/21.   Patient reminded to bring all medications and/or complete list.  Confirmed patient has transportation. Gave directions, instructed to utilize valet parking.  Confirmed appointment prior to ending call.

## 2021-09-17 ENCOUNTER — Ambulatory Visit (HOSPITAL_COMMUNITY)
Admission: RE | Admit: 2021-09-17 | Discharge: 2021-09-17 | Disposition: A | Discharge status: Home / Self Care | Payer: Managed Care, Other (non HMO) | Source: Ambulatory Visit | Attending: Family Medicine | Admitting: Family Medicine

## 2021-09-17 ENCOUNTER — Encounter (HOSPITAL_COMMUNITY): Payer: Self-pay

## 2021-09-17 VITALS — BP 148/86 | HR 85 | Wt 188.4 lb

## 2021-09-17 DIAGNOSIS — Z7984 Long term (current) use of oral hypoglycemic drugs: Secondary | ICD-10-CM | POA: Insufficient documentation

## 2021-09-17 DIAGNOSIS — I340 Nonrheumatic mitral (valve) insufficiency: Secondary | ICD-10-CM | POA: Diagnosis not present

## 2021-09-17 DIAGNOSIS — Z79899 Other long term (current) drug therapy: Secondary | ICD-10-CM | POA: Insufficient documentation

## 2021-09-17 DIAGNOSIS — I059 Rheumatic mitral valve disease, unspecified: Secondary | ICD-10-CM

## 2021-09-17 DIAGNOSIS — I110 Hypertensive heart disease with heart failure: Secondary | ICD-10-CM | POA: Diagnosis not present

## 2021-09-17 DIAGNOSIS — I5022 Chronic systolic (congestive) heart failure: Secondary | ICD-10-CM | POA: Diagnosis present

## 2021-09-17 DIAGNOSIS — I10 Essential (primary) hypertension: Secondary | ICD-10-CM | POA: Diagnosis not present

## 2021-09-17 DIAGNOSIS — Z7982 Long term (current) use of aspirin: Secondary | ICD-10-CM | POA: Diagnosis not present

## 2021-09-17 DIAGNOSIS — I428 Other cardiomyopathies: Secondary | ICD-10-CM | POA: Diagnosis not present

## 2021-09-17 DIAGNOSIS — I2510 Atherosclerotic heart disease of native coronary artery without angina pectoris: Secondary | ICD-10-CM | POA: Insufficient documentation

## 2021-09-17 LAB — BASIC METABOLIC PANEL
Anion gap: 7 (ref 5–15)
BUN: 12 mg/dL (ref 6–20)
CO2: 25 mmol/L (ref 22–32)
Calcium: 8.5 mg/dL — ABNORMAL LOW (ref 8.9–10.3)
Chloride: 108 mmol/L (ref 98–111)
Creatinine, Ser: 1.26 mg/dL — ABNORMAL HIGH (ref 0.44–1.00)
GFR, Estimated: 51 mL/min — ABNORMAL LOW (ref >60)
Glucose, Bld: 137 mg/dL — ABNORMAL HIGH (ref 70–99)
Potassium: 4 mmol/L (ref 3.5–5.1)
Sodium: 140 mmol/L (ref 135–145)

## 2021-09-17 LAB — BRAIN NATRIURETIC PEPTIDE: B Natriuretic Peptide: 402.5 pg/mL — ABNORMAL HIGH (ref 0.0–100.0)

## 2021-09-17 MED ORDER — POTASSIUM CHLORIDE ER 10 MEQ PO TBCR: 20.0000 meq | EXTENDED_RELEASE_TABLET | Freq: Every day | ORAL | 3 refills | Status: DC

## 2021-09-17 MED ORDER — TORSEMIDE 20 MG PO TABS: 40.0000 mg | ORAL_TABLET | Freq: Two times a day (BID) | ORAL | 4 refills | Status: DC

## 2021-09-17 NOTE — Progress Notes (Signed)
ReDS Vest / Clip - 09/17/21 0900       ReDS Vest / Clip   Station Marker A    Ruler Value 28    ReDS Value Range High volume overload    ReDS Actual Value 43

## 2021-09-17 NOTE — Patient Instructions (Signed)
Stop lasix  Start Torsemide 40mg  daily  Start Potassium daily   Labs done today, your results will be available in MyChart, we will contact you for abnormal readings.  Repeat blood work in 10 days.  Your physician recommends that you schedule a follow-up appointment in: as scheduled  If you have any questions or concerns before your next appointment please send a message through [place] or call our office at [phone].    TO LEAVE A MESSAGE FOR THE NURSE SELECT OPTION 2, PLEASE LEAVE A MESSAGE INCLUDING: YOUR NAME DATE OF BIRTH CALL BACK NUMBER REASON FOR CALL**this is important as we prioritize the call backs  YOU WILL RECEIVE A CALL BACK THE SAME DAY AS LONG AS YOU CALL BEFORE 4:00 PM  At the Advanced [facility] Clinic, you and your health needs are our priority. As part of our continuing mission to provide you with exceptional heart care, we have created designated Provider Care Teams. These Care Teams include your primary Cardiologist (physician) and Advanced Practice Providers (APPs- Physician Assistants and Nurse Practitioners) who all work together to provide you with the care you need, when you need it.   You may see any of the following providers on your designated Care Team at your next follow up: Dr [phone] Dr Arvilla Meres, NP Carron Curie, Robbie Lis [facility] [place], [place], PharmD   Please be sure to bring in all your medications bottles to every appointment.

## 2021-09-29 ENCOUNTER — Other Ambulatory Visit (HOSPITAL_COMMUNITY): Payer: Managed Care, Other (non HMO)

## 2021-10-01 ENCOUNTER — Ambulatory Visit (HOSPITAL_COMMUNITY)
Admission: RE | Admit: 2021-10-01 | Discharge: 2021-10-01 | Disposition: A | Discharge status: Home / Self Care | Payer: Managed Care, Other (non HMO) | Source: Ambulatory Visit | Attending: Cardiology | Admitting: Cardiology

## 2021-10-01 DIAGNOSIS — I5022 Chronic systolic (congestive) heart failure: Secondary | ICD-10-CM | POA: Insufficient documentation

## 2021-10-01 LAB — BASIC METABOLIC PANEL
Anion gap: 7 (ref 5–15)
BUN: 10 mg/dL (ref 6–20)
CO2: 24 mmol/L (ref 22–32)
Calcium: 9 mg/dL (ref 8.9–10.3)
Chloride: 109 mmol/L (ref 98–111)
Creatinine, Ser: 1.12 mg/dL — ABNORMAL HIGH (ref 0.44–1.00)
GFR, Estimated: 59 mL/min — ABNORMAL LOW (ref >60)
Glucose, Bld: 125 mg/dL — ABNORMAL HIGH (ref 70–99)
Potassium: 4.1 mmol/L (ref 3.5–5.1)
Sodium: 140 mmol/L (ref 135–145)

## 2021-10-16 ENCOUNTER — Ambulatory Visit (HOSPITAL_COMMUNITY)
Admission: RE | Admit: 2021-10-16 | Discharge: 2021-10-16 | Disposition: A | Discharge status: Home / Self Care | Payer: Managed Care, Other (non HMO) | Source: Ambulatory Visit | Attending: Cardiology | Admitting: Cardiology

## 2021-10-16 ENCOUNTER — Encounter (HOSPITAL_COMMUNITY): Payer: Self-pay | Admitting: Cardiology

## 2021-10-16 ENCOUNTER — Ambulatory Visit (HOSPITAL_BASED_OUTPATIENT_CLINIC_OR_DEPARTMENT_OTHER)
Admission: RE | Admit: 2021-10-16 | Discharge: 2021-10-16 | Disposition: A | Discharge status: Home / Self Care | Payer: Managed Care, Other (non HMO) | Source: Ambulatory Visit | Attending: Cardiology | Admitting: Cardiology

## 2021-10-16 VITALS — BP 140/80 | HR 66 | Wt 182.0 lb

## 2021-10-16 DIAGNOSIS — I5030 Unspecified diastolic (congestive) heart failure: Secondary | ICD-10-CM

## 2021-10-16 DIAGNOSIS — I340 Nonrheumatic mitral (valve) insufficiency: Secondary | ICD-10-CM | POA: Diagnosis not present

## 2021-10-16 DIAGNOSIS — I5022 Chronic systolic (congestive) heart failure: Secondary | ICD-10-CM | POA: Diagnosis not present

## 2021-10-16 DIAGNOSIS — Z7984 Long term (current) use of oral hypoglycemic drugs: Secondary | ICD-10-CM | POA: Insufficient documentation

## 2021-10-16 DIAGNOSIS — I428 Other cardiomyopathies: Secondary | ICD-10-CM | POA: Insufficient documentation

## 2021-10-16 DIAGNOSIS — I110 Hypertensive heart disease with heart failure: Secondary | ICD-10-CM | POA: Diagnosis not present

## 2021-10-16 DIAGNOSIS — I2510 Atherosclerotic heart disease of native coronary artery without angina pectoris: Secondary | ICD-10-CM | POA: Insufficient documentation

## 2021-10-16 DIAGNOSIS — I517 Cardiomegaly: Secondary | ICD-10-CM | POA: Diagnosis not present

## 2021-10-16 DIAGNOSIS — I080 Rheumatic disorders of both mitral and aortic valves: Secondary | ICD-10-CM

## 2021-10-16 DIAGNOSIS — I349 Nonrheumatic mitral valve disorder, unspecified: Secondary | ICD-10-CM | POA: Diagnosis not present

## 2021-10-16 DIAGNOSIS — Z79899 Other long term (current) drug therapy: Secondary | ICD-10-CM | POA: Insufficient documentation

## 2021-10-16 LAB — ECHOCARDIOGRAM COMPLETE
AR max vel: 1.1 cm2
AV Area VTI: 1.04 cm2
AV Area mean vel: 1.17 cm2
AV Mean grad: 5 mmHg
AV Peak grad: 8.4 mmHg
Ao pk vel: 1.45 m/s
Area-P 1/2: 5.88 cm2
Calc EF: 46.9 %
MV M vel: 5.72 m/s
MV Peak grad: 130.9 mmHg
S' Lateral: 4.5 cm
Single Plane A2C EF: 47.4 %
Single Plane A4C EF: 44.5 %

## 2021-10-16 LAB — BASIC METABOLIC PANEL WITH GFR
Anion gap: 9 (ref 5–15)
BUN: 14 mg/dL (ref 6–20)
CO2: 24 mmol/L (ref 22–32)
Calcium: 9.6 mg/dL (ref 8.9–10.3)
Chloride: 107 mmol/L (ref 98–111)
Creatinine, Ser: 1.21 mg/dL — ABNORMAL HIGH (ref 0.44–1.00)
GFR, Estimated: 54 mL/min — ABNORMAL LOW
Glucose, Bld: 90 mg/dL (ref 70–99)
Potassium: 3.9 mmol/L (ref 3.5–5.1)
Sodium: 140 mmol/L (ref 135–145)

## 2021-10-16 LAB — BRAIN NATRIURETIC PEPTIDE: B Natriuretic Peptide: 446.5 pg/mL — ABNORMAL HIGH (ref 0.0–100.0)

## 2021-10-16 MED ORDER — CARVEDILOL 25 MG PO TABS: 25.0000 mg | ORAL_TABLET | Freq: Two times a day (BID) | ORAL | 3 refills | Status: DC

## 2021-10-16 MED ORDER — DAPAGLIFLOZIN PROPANEDIOL 10 MG PO TABS: 10.0000 mg | ORAL_TABLET | Freq: Every day | ORAL | 3 refills | Status: DC

## 2021-10-16 MED ORDER — SPIRONOLACTONE 25 MG PO TABS: 25.0000 mg | ORAL_TABLET | Freq: Every day | ORAL | 3 refills | Status: DC

## 2021-10-16 MED ORDER — TORSEMIDE 20 MG PO TABS: 20.0000 mg | ORAL_TABLET | Freq: Two times a day (BID) | ORAL | 3 refills | Status: DC

## 2021-10-16 NOTE — Progress Notes (Signed)
Patient ID: Pamela Brooks, female   DOB: [birth year], 51 y.o.   MRN: [id number]    Advanced Heart Failure Clinic Note   PCP: None  HF Cardiology: Dr. Shirlee Latch  Pamela Brooks is a 51 y.o. with history of HTN and chronic systolic CHF presents for CHF clinic evaluation.  Pamela Brooks went to the ER in 6/16 with dyspnea and peripheral edema. No chest pain.  No prior viral-type illness.  Pamela Brooks was found to be in CHF, echo with EF 25-30%.  LHC was done, showing nonobstructive stenosis in the circumflex.  Pamela Brooks was diuresed and BP controlled.     Cardiac MRI in 1/17 showed EF improved to 55%. Admitted 1/23 -03/20/15 with hypotension and near syncope. It was discovered pt had continued her losartan on top of her Entresto. Meds helded and then resumed without losartan.  Spironolactone stopped with hypotension and improved EF. Discharge weight 167 lbs. Echo in 2/21 showed EF back down to 35-40% with restrictive posterior mitral leaflet and possible moderate mitral stenosis with moderate MR.   TEE was then done in 3/21, showing EF 45% with mild LV dilation/mild LV hypertrophy, mildly decreased RV systolic function, moderate MR, no mitral stenosis.   Follow up 5/23, volume overloaded, REDs 40%. Lasix and spiro restarted. Advised follow up in 2-3 weeks but Pamela Brooks cancelled her appointment twice.  Seen in ED 08/29/21 with CP and cough. Trop negative and BNP 2400, received IV lasix. Advised doubling lasix dose for a few days thereafter.  Echo was done today and reviewed, EF 45% with global hypokinesis, mild LV dilation, mildly decreased RV systolic function, moderate MR.   Today Pamela Brooks returns for HF follow up. Pamela Brooks has been off [place] and spironolactone.  Pamela Brooks just ran out of Coreg.  Dyspnea is improved with recent diuretic adjustment, weight down 6 lbs.  No dyspnea walking on flat ground or up a flight of stairs.  Still with 2 pillow orthopnea.  No lightheadedness. No chest pain.  No palpitations.    Labs (1/21): LDL 86, HDL 40 Labs (2/21):  K 3.7, creatinine 1.1 Labs (3/21): K 4, creatinine 0.98 Labs (7/23): K 3.7, creatinine 1.05, BNP 402 Labs (8/23): K 4.1, creatinine 1.12  PMH: 1. HTN 2. Chronic systolic CHF: Nonischemic cardiomyopathy, ?due to myocarditis versus HTN.   - LHC (6/16) with 50% mLCx stenosis.  Echo (6/16) with EF 25-30%, moderately dilated LV, moderate MR.   - Echo (10/16) with EF 30%, moderate LV dilation.  - cMRI  03/19/15 LVEF 55%, normal RV, no LGE, so no definitive evidence for prior MI, myocarditis, or infiltrative disease. - Echo 03/2019 EF 35-40%, mitral valve restricted posterior leaflet, moderate mitral stenosis - TEE (3/21): EF 45%, mild LV dilation with mild LVH, mildly decreased RV systolic function, moderate MR with ERO 0.2 cm^2, no mitral stenosis, posterior mitral leaflet restriction.  - Echo (8/23): EF 45% with global hypokinesis, mild LV dilation, mildly decreased RV systolic function, moderate MR.  3. CAD: LHC (6/16) with 50% mLCx stenosis.  4. Mitral valve disorder: TEE in 3/21 with moderate MR and restricted posterior leaflet, no Pamela.  - Moderate MR on 8/23 echo.   SH: Lives with parents in [place].  Works at [employer].  Non smoker, no ETOH.   FH: Father and mother with HTN.  No CHF or CAD that Pamela Brooks knows of.    ROS: All systems reviewed and negative except as per HPI.   Current Outpatient Medications  Medication Sig Dispense Refill   acetaminophen (TYLENOL)  500 MG tablet Take 500 mg by mouth every 6 (six) hours as needed for mild pain.     aspirin EC 81 MG EC tablet Take 1 tablet (81 mg total) by mouth daily. 30 tablet 0   atorvastatin (LIPITOR) 40 MG tablet Take 1 tablet (40 mg total) by mouth daily. 30 tablet 5   potassium chloride (KLOR-CON) 10 MEQ tablet Take 2 tablets (20 mEq total) by mouth daily. 180 tablet 3   sacubitril-valsartan (ENTRESTO) 97-103 MG TAKE 1 TABLET BY MOUTH TWICE DAILY 60 tablet 11   carvedilol (COREG) 25 MG tablet Take 1 tablet (25 mg total) by mouth 2 (two)  times daily with a meal. 180 tablet 3   dapagliflozin propanediol (FARXIGA) 10 MG TABS tablet Take 1 tablet (10 mg total) by mouth daily before breakfast. 90 tablet 3   spironolactone (ALDACTONE) 25 MG tablet Take 1 tablet (25 mg total) by mouth daily. 90 tablet 3   torsemide (DEMADEX) 20 MG tablet Take 1 tablet (20 mg total) by mouth 2 (two) times daily. 90 tablet 3   No current facility-administered medications for this encounter.   BP (!) 140/80   Pulse 66   Wt 82.6 kg (182 lb)   LMP 02/23/2015   SpO2 98%   BMI 34.39 kg/m   Wt Readings from Last 3 Encounters:  10/16/21 82.6 kg (182 lb)  09/17/21 85.5 kg (188 lb 6.4 oz)  08/29/21 74.8 kg (165 lb)    General: NAD Neck: No JVD, no thyromegaly or thyroid nodule.  Lungs: Clear to auscultation bilaterally with normal respiratory effort. CV: Nondisplaced PMI.  Heart regular S1/S2, no S3/S4, no murmur.  No peripheral edema.  No carotid bruit.  Normal pedal pulses.  Abdomen: Soft, nontender, no hepatosplenomegaly, no distention.  Skin: Intact without lesions or rashes.  Neurologic: Alert and oriented x 3.  Psych: Normal affect. Extremities: No clubbing or cyanosis.  HEENT: Normal.   Assessment/Plan: 1. Chronic systolic CHF: Nonischemic cardiomyopathy.  Cardiac MRI from 1/17 showed improvement in EF up to 55%.  Possible viral myocarditis with recovery. However, TEE in 3/21 showed EF down a bit to 45%.  Echo today showed EF 45% with global hypokinesis, mild LV dilation, mildly decreased RV systolic function, moderate MR. Marland Kitchen Possibly hypertensive cardiomyopathy.  NYHA class II symptoms, Pamela Brooks is not volume overloaded on exam.  - Decrease torsemide to 20 mg daily with restarting Farxiga and spironolactone (see below).  BMET today.  - Restart spironolactone 25 mg daily. BMET 10 days.  - Continue Coreg 25 mg bid.    - Continue Entresto 97-103 mg bid.  - Restart Farxiga 10 mg daily.  2. CAD: Moderate nonobstructive CAD on cath 2016.  No chest  pain.  - Continue ASA 81.  - Continue atorvastatin 40 mg daily.   3. HTN: BP elevated, restart spironolactone 25 mg daily.  4. Mitral valve disorders: TEE in 3/21 with restricted posterior mitral leaflet and moderate MR with no mitral stenosis.  Moderate MR on echo today.   Followup in 2 months with APP.   Marca Ancona 10/16/2021

## 2021-10-16 NOTE — Patient Instructions (Signed)
Decrease Torsemide to 20 mg daily.  RESTART Farxiga 10 mg daily  RESTART Spironolactone 25 mg daily  Labs done today, your results will be available in MyChart, we will contact you for abnormal readings.  Repeat blood work in 10 days   Your physician recommends that you schedule a follow-up appointment in: 2 months  If you have any questions or concerns before your next appointment please send [place] a message through [place] or call our office at [phone].    TO LEAVE A MESSAGE FOR THE NURSE SELECT OPTION 2, PLEASE LEAVE A MESSAGE INCLUDING: YOUR NAME DATE OF BIRTH CALL BACK NUMBER REASON FOR CALL**this is important as we prioritize the call backs  YOU WILL RECEIVE A CALL BACK THE SAME DAY AS LONG AS YOU CALL BEFORE 4:00 PM  At the [facility], you and your health needs are our priority. As part of our continuing mission to provide you with exceptional heart care, we have created designated Provider Care Teams. These Care Teams include your primary Cardiologist (physician) and Advanced Practice Providers (APPs- Physician Assistants and Nurse Practitioners) who all work together to provide you with the care you need, when you need it.   You may see any of the following providers on your designated Care Team at your next follow up: Dr Arvilla Meres Dr Carron Curie, NP Robbie Lis, [place] [facility] [place], [place] Karle Plumber, PharmD   Please be sure to bring in all your medications bottles to every appointment.

## 2021-10-29 ENCOUNTER — Other Ambulatory Visit (HOSPITAL_COMMUNITY): Payer: Managed Care, Other (non HMO)

## 2021-12-15 ENCOUNTER — Telehealth (HOSPITAL_COMMUNITY): Payer: Self-pay

## 2021-12-15 NOTE — Telephone Encounter (Signed)
Called and left patient a voice message to confirm/remind patient of their appointment at the [facility] on 12/16/21.

## 2021-12-16 ENCOUNTER — Encounter (HOSPITAL_COMMUNITY): Payer: Self-pay

## 2021-12-16 ENCOUNTER — Ambulatory Visit (HOSPITAL_COMMUNITY)
Admission: RE | Admit: 2021-12-16 | Discharge: 2021-12-16 | Disposition: A | Discharge status: Home / Self Care | Payer: Managed Care, Other (non HMO) | Source: Ambulatory Visit | Attending: Family Medicine | Admitting: Family Medicine

## 2021-12-16 VITALS — BP 162/98 | HR 71 | Wt 192.2 lb

## 2021-12-16 DIAGNOSIS — Z006 Encounter for examination for normal comparison and control in clinical research program: Secondary | ICD-10-CM

## 2021-12-16 DIAGNOSIS — I2510 Atherosclerotic heart disease of native coronary artery without angina pectoris: Secondary | ICD-10-CM | POA: Diagnosis not present

## 2021-12-16 DIAGNOSIS — I5022 Chronic systolic (congestive) heart failure: Secondary | ICD-10-CM

## 2021-12-16 DIAGNOSIS — I059 Rheumatic mitral valve disease, unspecified: Secondary | ICD-10-CM | POA: Diagnosis not present

## 2021-12-16 DIAGNOSIS — I428 Other cardiomyopathies: Secondary | ICD-10-CM | POA: Insufficient documentation

## 2021-12-16 DIAGNOSIS — Z79899 Other long term (current) drug therapy: Secondary | ICD-10-CM | POA: Diagnosis not present

## 2021-12-16 DIAGNOSIS — I110 Hypertensive heart disease with heart failure: Secondary | ICD-10-CM | POA: Diagnosis present

## 2021-12-16 DIAGNOSIS — I10 Essential (primary) hypertension: Secondary | ICD-10-CM

## 2021-12-16 LAB — LIPID PANEL
Cholesterol: 162 mg/dL (ref 0–200)
HDL: 54 mg/dL (ref >40)
LDL Cholesterol: 92 mg/dL (ref 0–99)
Total CHOL/HDL Ratio: 3 RATIO
Triglycerides: 81 mg/dL (ref <150)
VLDL: 16 mg/dL (ref 0–40)

## 2021-12-16 LAB — BASIC METABOLIC PANEL
Anion gap: 11 (ref 5–15)
BUN: 16 mg/dL (ref 6–20)
CO2: 23 mmol/L (ref 22–32)
Calcium: 9.1 mg/dL (ref 8.9–10.3)
Chloride: 108 mmol/L (ref 98–111)
Creatinine, Ser: 1.15 mg/dL — ABNORMAL HIGH (ref 0.44–1.00)
GFR, Estimated: 57 mL/min — ABNORMAL LOW (ref >60)
Glucose, Bld: 128 mg/dL — ABNORMAL HIGH (ref 70–99)
Potassium: 4 mmol/L (ref 3.5–5.1)
Sodium: 142 mmol/L (ref 135–145)

## 2021-12-16 LAB — BRAIN NATRIURETIC PEPTIDE: B Natriuretic Peptide: 206.5 pg/mL — ABNORMAL HIGH (ref 0.0–100.0)

## 2021-12-16 MED ORDER — ISOSORB DINITRATE-HYDRALAZINE 20-37.5 MG PO TABS: 0.5000 | ORAL_TABLET | Freq: Three times a day (TID) | ORAL | 11 refills | Status: DC

## 2021-12-16 NOTE — Research (Deleted)
Spoke with patient about Detect HF study, pt expressed interest in participating. Informed consent given to pt to read over and phone number for research given. Encouraged to call with any questions. Will f/u with patient.

## 2021-12-16 NOTE — Progress Notes (Addendum)
Patient ID: Pamela Brooks, female   DOB: 28-Nov-1970, 51 y.o.   MRN: [id number]    Advanced Heart Failure Clinic Note   PCP: None  HF Cardiology: Dr. Aundra Dubin  Pamela Brooks is a 51 y.o. with history of HTN and chronic systolic CHF presents for CHF clinic evaluation.  She went to the ER in 6/16 with dyspnea and peripheral edema. No chest pain.  No prior viral-type illness.  She was found to be in CHF, echo with EF 25-30%.  LHC was done, showing nonobstructive stenosis in the circumflex.  She was diuresed and BP controlled.     Cardiac MRI in 1/17 showed EF improved to 55%. Admitted 1/23 -03/20/15 with hypotension and near syncope. It was discovered pt had continued her losartan on top of her Entresto. Meds helded and then resumed without losartan.  Spironolactone stopped with hypotension and improved EF. Discharge weight 167 lbs. Echo in 2/21 showed EF back down to 35-40% with restrictive posterior mitral leaflet and possible moderate mitral stenosis with moderate MR.   TEE was then done in 3/21, showing EF 45% with mild LV dilation/mild LV hypertrophy, mildly decreased RV systolic function, moderate MR, no mitral stenosis.   Follow up 5/23, volume overloaded, REDs 40%. Lasix and spiro restarted. Advised follow up in 2-3 weeks but she cancelled her appointment twice.  Seen in ED 08/29/21 with CP and cough. Trop negative and BNP 2400, received IV lasix. Advised doubling lasix dose for a few days thereafter.  Echo 8/23 showed EF 45% with global hypokinesis, mild LV dilation, mildly decreased RV systolic function, moderate MR.   Today she returns for HF follow up. Overall feeling fine. Feels better after taking torsemide twice a day. She works full time at [employer] and has no shortness of breath with her work duties. Denies palpitations, CP, dizziness, edema, or PND/Orthopnea. Appetite ok. No fever or chills. Weight at home 186 pounds. Taking all medications. No tobacco/ETPH/drug use. Does not check BP at  home.   Labs (1/21): LDL 86, HDL 40 Labs (2/21): K 3.7, creatinine 1.1 Labs (3/21): K 4, creatinine 0.98 Labs (7/23): K 3.7, creatinine 1.05, BNP 402 Labs (8/23): K 4.1, creatinine 1.12  PMH: 1. HTN 2. Chronic systolic CHF: Nonischemic cardiomyopathy, ?due to myocarditis versus HTN.   - LHC (6/16) with 50% mLCx stenosis.  Echo (6/16) with EF 25-30%, moderately dilated LV, moderate MR.   - Echo (10/16) with EF 30%, moderate LV dilation.  - cMRI  03/19/15 LVEF 55%, normal RV, no LGE, so no definitive evidence for prior MI, myocarditis, or infiltrative disease. - Echo 03/2019 EF 35-40%, mitral valve restricted posterior leaflet, moderate mitral stenosis - TEE (3/21): EF 45%, mild LV dilation with mild LVH, mildly decreased RV systolic function, moderate MR with ERO 0.2 cm^2, no mitral stenosis, posterior mitral leaflet restriction.  - Echo (8/23): EF 45% with global hypokinesis, mild LV dilation, mildly decreased RV systolic function, moderate MR.  3. CAD: LHC (6/16) with 50% mLCx stenosis.  4. Mitral valve disorder: TEE in 3/21 with moderate MR and restricted posterior leaflet, no Pamela.  - Moderate MR on 8/23 echo.   SH: Lives with parents in [place].  Works at [employer].  Non smoker, no ETOH.   FH: Father and mother with HTN.  No CHF or CAD that she knows of.    ROS: All systems reviewed and negative except as per HPI.   Current Outpatient Medications  Medication Sig Dispense Refill   acetaminophen (TYLENOL) 500  MG tablet Take 500 mg by mouth every 6 (six) hours as needed for mild pain.     aspirin EC 81 MG EC tablet Take 1 tablet (81 mg total) by mouth daily. 30 tablet 0   atorvastatin (LIPITOR) 40 MG tablet Take 1 tablet (40 mg total) by mouth daily. 30 tablet 5   carvedilol (COREG) 25 MG tablet Take 1 tablet (25 mg total) by mouth 2 (two) times daily with a meal. 180 tablet 3   dapagliflozin propanediol (FARXIGA) 10 MG TABS tablet Take 1 tablet (10 mg total) by mouth daily before  breakfast. 90 tablet 3   potassium chloride (KLOR-CON) 10 MEQ tablet Take 2 tablets (20 mEq total) by mouth daily. 180 tablet 3   sacubitril-valsartan (ENTRESTO) 97-103 MG TAKE 1 TABLET BY MOUTH TWICE DAILY 60 tablet 11   spironolactone (ALDACTONE) 25 MG tablet Take 1 tablet (25 mg total) by mouth daily. 90 tablet 3   torsemide (DEMADEX) 20 MG tablet Take 1 tablet (20 mg total) by mouth 2 (two) times daily. 90 tablet 3   No current facility-administered medications for this encounter.   BP (!) 162/98   Pulse 71   Wt 87.2 kg (192 lb 3.2 oz)   LMP 02/23/2015   SpO2 99%   BMI 36.32 kg/m   Wt Readings from Last 3 Encounters:  12/16/21 87.2 kg (192 lb 3.2 oz)  10/16/21 82.6 kg (182 lb)  09/17/21 85.5 kg (188 lb 6.4 oz)   Physical Exam  General:  NAD. No resp difficulty HEENT: Normal Neck: Supple. No JVD. Carotids 2+ bilat; no bruits. No lymphadenopathy or thryomegaly appreciated. Cor: PMI nondisplaced. Regular rate & rhythm. No rubs, gallops or murmurs. Lungs: Clear Abdomen: Soft, nontender, nondistended. No hepatosplenomegaly. No bruits or masses. Good bowel sounds. Extremities: No cyanosis, clubbing, rash, edema Neuro: Alert & oriented x 3, cranial nerves grossly intact. Moves all 4 extremities w/o difficulty. Affect pleasant.  Assessment/Plan: 1. Chronic systolic CHF: Nonischemic cardiomyopathy.  Cardiac MRI from 1/17 showed improvement in EF up to 55%.  Possible viral myocarditis with recovery. However, TEE in 3/21 showed EF down a bit to 45%.  Echo 8/23 showed EF 45% with global hypokinesis, mild LV dilation, mildly decreased RV systolic function, moderate MR. Marland Kitchen Possibly hypertensive cardiomyopathy.  NYHA class I-II symptoms, she is not volume overloaded on exam.  - Continue torsemide 20 mg bid + 20 KCL daily. - Continue spironolactone 25 mg daily. BMET/BNP today. - Continue Coreg 25 mg bid.    - Continue Entresto 97-103 mg bid.  - Continue Farxiga 10 mg daily.  2. CAD: Moderate  nonobstructive CAD on cath 2016.  No chest pain.  - Continue ASA 81.  - Continue atorvastatin 40 mg daily.  Check lipids today. 3. HTN: Elevated today. Given Rx for BP cuff and asked to check daily.  - Start [name] 0.5 tab tid. 4. Mitral valve disorders: TEE in 3/21 with restricted posterior mitral leaflet and moderate MR with no mitral stenosis.  Moderate MR on recent echo. 5. SDOH: She is establishing with PCP in [place].  Follow up in 4 months with Dr. Aundra Dubin. She was given a note for her employer today.  Pamela Bo Davonne Baby FNP-BC 12/16/2021  CARDIAC PHYSICAL EXAMINATION        [] Not Done  Total JVP: 6 cm   (Normal = up to total of 7cm)   Peripheral Edema Grade: X ? 0   ? 1   ? 2  ? 3 ?  4   PHYSICIANS DYSPNEA ASSESSMENT*                                 []  Not Done  Dyspnea ? Yes ? No X  NYHA Class ? 1   X ? 2     ? 3     ? 4  Speech Dyspnea ? Yes ? No X  Paroxysmal Nocturnal Dyspnea  ? Yes ? No X  Orthopnea ? Yes ? No X  Pulmonary Congestion (if yes, check severe under 'Medical Condition' below) ? Yes ? No X  Pleural Fluid ? Right ? Left ? Both      ? N/A X  Wheezing ? Yes ? No X  Severity of medical condition (select 'severe' if pulmonary congestion is present): X ? Mild   ? Moderate ? Severe  Comments: __________________________________________________________________________________________________________________________________________________________________________________________________________________________________________

## 2021-12-16 NOTE — Addendum Note (Signed)
Encounter addended by: Rafael Bihari, FNP on: 12/16/2021 9:55 AM  Actions taken: Clinical Note Signed

## 2021-12-16 NOTE — Patient Instructions (Signed)
START [name] one half tab three times daily  Labs today We will only contact you if something comes back abnormal or we need to make some changes. Otherwise no news is good news!  Your physician has requested that you regularly monitor and record your blood pressure readings at home. Please use the same machine at the same time of day to check your readings and record them to bring to your follow-up visit. -please give our office a call if you systolic blood pressure is consistently below 100  Your physician wants you to follow-up in: 4 months with Dr Pamela Brooks. You will receive a reminder letter in the mail two months in advance. If you don't receive a letter, please call our office to schedule the follow-up appointment.   Do the following things EVERYDAY: Weigh yourself in the morning before breakfast. Write it down and keep it in a log. Take your medicines as prescribed Eat low salt foods--Limit salt (sodium) to 2000 mg per day.  Stay as active as you can everyday Limit all fluids for the day to less than 2 liters  At the [facility], you and your health needs are our priority. As part of our continuing mission to provide you with exceptional heart care, we have created designated Provider Care Teams. These Care Teams include your primary Cardiologist (physician) and Advanced Practice Providers (APPs- Physician Assistants and Nurse Practitioners) who all work together to provide you with the care you need, when you need it.   You may see any of the following providers on your designated Care Team at your next follow up: Dr Glori Bickers Dr Loralie Champagne Dr. Roxana Hires, NP Lyda Jester, [place] [facility] [place], [place] Forestine Na, NP Audry Riles, PharmD   Please be sure to bring in all your medications bottles to every appointment.   If you have any questions or concerns before your next appointment please send [place] a message through  [place] or call our office at [phone].    TO LEAVE A MESSAGE FOR THE NURSE SELECT OPTION 2, PLEASE LEAVE A MESSAGE INCLUDING: YOUR NAME DATE OF BIRTH CALL BACK NUMBER REASON FOR CALL**this is important as we prioritize the call backs  YOU WILL RECEIVE A CALL BACK THE SAME DAY AS LONG AS YOU CALL BEFORE 4:00 PM

## 2021-12-24 ENCOUNTER — Telehealth (HOSPITAL_COMMUNITY): Payer: Self-pay

## 2021-12-24 DIAGNOSIS — I5022 Chronic systolic (congestive) heart failure: Secondary | ICD-10-CM

## 2021-12-24 MED ORDER — ATORVASTATIN CALCIUM 40 MG PO TABS: 40.0000 mg | ORAL_TABLET | Freq: Every day | ORAL | 5 refills | Status: DC

## 2021-12-24 NOTE — Telephone Encounter (Signed)
-----   Message from Rafael Bihari, [place] sent at 12/16/2021  3:39 PM EDT ----- LDL higher at 92. Please ensure she has been taking atorva 40 consistently. If so, increase atorva to 80 mg daily. Repeat LFTs and lipids in 6-8 weeks.  Other labs stable

## 2021-12-24 NOTE — Telephone Encounter (Signed)
Spoke to patient about lab results.She hasn"t been taking atorvastatin as she ran out. Allena Katz NP made aware. Atorvastatin kept at 40 mg.repeat lab work scheduled as per Rudi Heap NP

## 2022-02-04 ENCOUNTER — Other Ambulatory Visit (HOSPITAL_COMMUNITY): Payer: Managed Care, Other (non HMO)

## 2022-02-09 ENCOUNTER — Other Ambulatory Visit (HOSPITAL_COMMUNITY): Payer: Managed Care, Other (non HMO)

## 2022-08-11 ENCOUNTER — Other Ambulatory Visit (HOSPITAL_COMMUNITY): Payer: Self-pay | Admitting: Family Medicine

## 2023-02-14 ENCOUNTER — Other Ambulatory Visit (HOSPITAL_COMMUNITY): Payer: Self-pay | Admitting: Cardiology

## 2023-02-14 ENCOUNTER — Other Ambulatory Visit (HOSPITAL_COMMUNITY): Payer: Self-pay | Admitting: Family Medicine

## 2023-03-14 ENCOUNTER — Other Ambulatory Visit (HOSPITAL_COMMUNITY): Payer: Self-pay | Admitting: Family Medicine

## 2023-04-12 ENCOUNTER — Other Ambulatory Visit (HOSPITAL_COMMUNITY): Payer: Self-pay | Admitting: Family Medicine

## 2023-04-19 ENCOUNTER — Telehealth (HOSPITAL_COMMUNITY): Payer: Self-pay | Admitting: Cardiology

## 2023-04-19 NOTE — Telephone Encounter (Signed)
 Called patient at [phone] to remind patient of her appt with Dr. Shirlee Latch on 04/20/23 at 10:20 AM.  Front office confirmed appt with patient for tomorrow 04/20/23 at 10:20 AM with Dr. Shirlee Latch.  Front office provided patient gate code to park in the garage for February 2025.

## 2023-04-20 ENCOUNTER — Encounter (HOSPITAL_COMMUNITY): Payer: Self-pay | Admitting: Cardiology

## 2023-04-20 ENCOUNTER — Telehealth (HOSPITAL_COMMUNITY): Payer: Self-pay | Admitting: Pharmacy Technician

## 2023-04-20 ENCOUNTER — Ambulatory Visit (HOSPITAL_COMMUNITY)
Admission: RE | Admit: 2023-04-20 | Discharge: 2023-04-20 | Disposition: A | Discharge status: Home / Self Care | Payer: Medicaid Other | Source: Ambulatory Visit | Attending: Cardiology | Admitting: Cardiology

## 2023-04-20 ENCOUNTER — Other Ambulatory Visit (HOSPITAL_COMMUNITY): Payer: Self-pay

## 2023-04-20 VITALS — BP 140/90 | HR 86 | Wt 196.6 lb

## 2023-04-20 DIAGNOSIS — I493 Ventricular premature depolarization: Secondary | ICD-10-CM | POA: Diagnosis not present

## 2023-04-20 DIAGNOSIS — I340 Nonrheumatic mitral (valve) insufficiency: Secondary | ICD-10-CM | POA: Diagnosis not present

## 2023-04-20 DIAGNOSIS — I428 Other cardiomyopathies: Secondary | ICD-10-CM | POA: Insufficient documentation

## 2023-04-20 DIAGNOSIS — Z7982 Long term (current) use of aspirin: Secondary | ICD-10-CM | POA: Insufficient documentation

## 2023-04-20 DIAGNOSIS — I110 Hypertensive heart disease with heart failure: Secondary | ICD-10-CM | POA: Diagnosis not present

## 2023-04-20 DIAGNOSIS — Z79899 Other long term (current) drug therapy: Secondary | ICD-10-CM | POA: Insufficient documentation

## 2023-04-20 DIAGNOSIS — I5022 Chronic systolic (congestive) heart failure: Secondary | ICD-10-CM | POA: Insufficient documentation

## 2023-04-20 DIAGNOSIS — I2510 Atherosclerotic heart disease of native coronary artery without angina pectoris: Secondary | ICD-10-CM | POA: Diagnosis not present

## 2023-04-20 DIAGNOSIS — Z8249 Family history of ischemic heart disease and other diseases of the circulatory system: Secondary | ICD-10-CM | POA: Diagnosis not present

## 2023-04-20 LAB — LIPID PANEL
Cholesterol: 169 mg/dL (ref 0–200)
HDL: 56 mg/dL (ref >40)
LDL Cholesterol: 98 mg/dL (ref 0–99)
Total CHOL/HDL Ratio: 3 {ratio}
Triglycerides: 76 mg/dL (ref <150)
VLDL: 15 mg/dL (ref 0–40)

## 2023-04-20 LAB — BRAIN NATRIURETIC PEPTIDE: B Natriuretic Peptide: 113.8 pg/mL — ABNORMAL HIGH (ref 0.0–100.0)

## 2023-04-20 LAB — BASIC METABOLIC PANEL
Anion gap: 7 (ref 5–15)
BUN: 10 mg/dL (ref 6–20)
CO2: 23 mmol/L (ref 22–32)
Calcium: 9.1 mg/dL (ref 8.9–10.3)
Chloride: 109 mmol/L (ref 98–111)
Creatinine, Ser: 1.21 mg/dL — ABNORMAL HIGH (ref 0.44–1.00)
GFR, Estimated: 53 mL/min — ABNORMAL LOW (ref >60)
Glucose, Bld: 93 mg/dL (ref 70–99)
Potassium: 4.2 mmol/L (ref 3.5–5.1)
Sodium: 139 mmol/L (ref 135–145)

## 2023-04-20 MED ORDER — ISOSORB DINITRATE-HYDRALAZINE 20-37.5 MG PO TABS: 0.5000 | ORAL_TABLET | Freq: Three times a day (TID) | ORAL | 4 refills | Status: DC

## 2023-04-20 MED ORDER — SPIRONOLACTONE 25 MG PO TABS: 25.0000 mg | ORAL_TABLET | Freq: Every day | ORAL | 3 refills | Status: AC

## 2023-04-20 MED ORDER — FARXIGA 10 MG PO TABS: 10.0000 mg | ORAL_TABLET | Freq: Every day | ORAL | 3 refills | Status: AC

## 2023-04-20 MED ORDER — ATORVASTATIN CALCIUM 40 MG PO TABS: 40.0000 mg | ORAL_TABLET | Freq: Every day | ORAL | 3 refills | Status: AC

## 2023-04-20 MED ORDER — POTASSIUM CHLORIDE ER 10 MEQ PO TBCR: 20.0000 meq | EXTENDED_RELEASE_TABLET | Freq: Every day | ORAL | 3 refills | Status: AC

## 2023-04-20 NOTE — Telephone Encounter (Signed)
 Patient Advocate Encounter   Received notification from [facility] that prior authorization for Marcelline Deist is required.   PA submitted on CoverMyMeds Key [id number] Status is pending   Will continue to follow.

## 2023-04-20 NOTE — Telephone Encounter (Signed)
 Advanced Heart Failure Patient Advocate Encounter  Received PA notice for generic [name]. Test claim shows that brand name is required, co-pay $4. No PA is needed for brand name. Generic is not preferred. Sent 90 day RX request to Chantel (CMA) to send to [place].   Archer Asa, CPhT

## 2023-04-20 NOTE — Patient Instructions (Signed)
 RESTART the following medications  SPIRONOLACTONE 25 MG DAILY.  FARXIGA 10 MG DAILY.  POTASSIUM 20 mEq (2 TABS) DAILY.  [name] 12 TAB Three times a day  ATORVASTATIN 40 MG DAILY.  Labs done today, your results will be available in MyChart, we will contact you for abnormal readings.  Repeat blood work in 10 days.  Your physician has requested that you have an echocardiogram. Echocardiography is a painless test that uses sound waves to create images of your heart. It provides your doctor with information about the size and shape of your heart and how well your heart's chambers and valves are working. This procedure takes approximately one hour. There are no restrictions for this procedure. Please do NOT wear cologne, perfume, aftershave, or lotions (deodorant is allowed). Please arrive 15 minutes prior to your appointment time.  Please note: We ask at that you not bring children with you during ultrasound (echo/ vascular) testing. Due to room size and safety concerns, children are not allowed in the ultrasound rooms during exams. Our front office staff cannot provide observation of children in our lobby area while testing is being conducted. An adult accompanying a patient to their appointment will only be allowed in the ultrasound room at the discretion of the ultrasound technician under special circumstances. We apologize for any inconvenience.  Your physician recommends that you schedule a follow-up appointment in: 1 month  If you have any questions or concerns before your next appointment please send [place] a message through [place] or call our office at [phone].    TO LEAVE A MESSAGE FOR THE NURSE SELECT OPTION 2, PLEASE LEAVE A MESSAGE INCLUDING: YOUR NAME DATE OF BIRTH CALL BACK NUMBER REASON FOR CALL**this is important as we prioritize the call backs  YOU WILL RECEIVE A CALL BACK THE SAME DAY AS LONG AS YOU CALL BEFORE 4:00 PM  At the [facility], you and  your health needs are our priority. As part of our continuing mission to provide you with exceptional heart care, we have created designated Provider Care Teams. These Care Teams include your primary Cardiologist (physician) and Advanced Practice Providers (APPs- Physician Assistants and Nurse Practitioners) who all work together to provide you with the care you need, when you need it.   You may see any of the following providers on your designated Care Team at your next follow up: Dr Arvilla Meres Dr Marca Ancona Dr. Dorthula Nettles Dr. Clearnce Hasten Amy Filbert Schilder, NP Robbie Lis, [place] [facility] [place], [place] Brynda Peon, NP [place] Lee, NP Clarisa Kindred, NP Karle Plumber, PharmD Enos Fling, PharmD   Please be sure to bring in all your medications bottles to every appointment.    Thank you for choosing [facility]

## 2023-04-21 ENCOUNTER — Other Ambulatory Visit (HOSPITAL_COMMUNITY): Payer: Self-pay

## 2023-04-21 ENCOUNTER — Telehealth (HOSPITAL_COMMUNITY): Payer: Self-pay

## 2023-04-21 DIAGNOSIS — I5022 Chronic systolic (congestive) heart failure: Secondary | ICD-10-CM

## 2023-04-21 MED ORDER — BIDIL 20-37.5 MG PO TABS: 0.5000 | ORAL_TABLET | Freq: Three times a day (TID) | ORAL | 3 refills | Status: AC

## 2023-04-21 NOTE — Addendum Note (Signed)
 Addended by: Theresia Bough on: 04/21/2023 10:08 AM   Modules accepted: Orders

## 2023-04-21 NOTE — Telephone Encounter (Addendum)
 Aware of restarting atorvastatin, repeat labwork in 2 months  ----- Message from Marca Ancona sent at 04/21/2023  1:11 AM EST ----- LDL too high, restarting atorvastatin and will need to repeat LFTs/lipids in 2 months.

## 2023-04-21 NOTE — Telephone Encounter (Signed)
 Advanced Heart Failure Patient Advocate Encounter  Prior Authorization for Marcelline Deist has been approved.    PA# [id number] Effective dates: 04/20/23 through 04/19/24  Patients co-pay is $4 (90 days)  Archer Asa, CPhT

## 2023-04-21 NOTE — Progress Notes (Signed)
 Patient ID: Pamela Brooks, female   DOB: Dec 19, 1970, 52 y.o.   MRN: [id number]    Advanced Heart Failure Clinic Note   PCP: Pcp, No HF Cardiology: Dr. Shirlee Latch  Chief complaint: CHF  Pamela Brooks is a 53 y.o. with history of HTN and chronic systolic CHF presents for CHF clinic evaluation.  She went to the ER in 6/16 with dyspnea and peripheral edema. No chest pain.  No prior viral-type illness.  She was found to be in CHF, echo with EF 25-30%.  LHC was done, showing nonobstructive stenosis in the circumflex.  She was diuresed and BP controlled.     Cardiac MRI in 1/17 showed EF improved to 55%. Admitted 1/23 -03/20/15 with hypotension and near syncope. It was discovered pt had continued her losartan on top of her Entresto. Meds helded and then resumed without losartan.  Spironolactone stopped with hypotension and improved EF. Discharge weight 167 lbs. Echo in 2/21 showed EF back down to 35-40% with restrictive posterior mitral leaflet and possible moderate mitral stenosis with moderate MR.   TEE was then done in 3/21, showing EF 45% with mild LV dilation/mild LV hypertrophy, mildly decreased RV systolic function, moderate MR, no mitral stenosis.   Follow up 5/23, volume overloaded, REDs 40%. Lasix and spiro restarted. Advised follow up in 2-3 weeks but she cancelled her appointment twice.  Seen in ED 08/29/21 with CP and cough. Trop negative and BNP 2400, received IV lasix. Advised doubling lasix dose for a few days thereafter.  Echo 8/23 showed EF 45% with global hypokinesis, mild LV dilation, mildly decreased RV systolic function, moderate MR.   Today she returns for HF follow up. We have not seen her here for > 1 year.  She is currently off atorvastatin, Farxiga, [name], KCl, and spironolactone.  She gets fatigued easily.  She notes shortness of breath walking up stairs though she denies dyspnea walking on flat ground.  She has orthopnea and sleeps on 2 pillows.  No chest pain.  No palpitations or  lightheadedness.  No problems walking to the mailbox. She is out of work since the [employer] in [place] closed. Weight up 4 lbs.   ECG (personally reviewed): NSR, PVC, QTc 517  Labs (1/21): LDL 86, HDL 40 Labs (2/21): K 3.7, creatinine 1.1 Labs (3/21): K 4, creatinine 0.98 Labs (7/23): K 3.7, creatinine 1.05, BNP 402 Labs (8/23): K 4.1, creatinine 1.12  PMH: 1. HTN 2. Chronic systolic CHF: Nonischemic cardiomyopathy, ?due to myocarditis versus HTN.   - LHC (6/16) with 50% mLCx stenosis.  Echo (6/16) with EF 25-30%, moderately dilated LV, moderate MR.   - Echo (10/16) with EF 30%, moderate LV dilation.  - cMRI  03/19/15 LVEF 55%, normal RV, no LGE, so no definitive evidence for prior MI, myocarditis, or infiltrative disease. - Echo 03/2019 EF 35-40%, mitral valve restricted posterior leaflet, moderate mitral stenosis - TEE (3/21): EF 45%, mild LV dilation with mild LVH, mildly decreased RV systolic function, moderate MR with ERO 0.2 cm^2, no mitral stenosis, posterior mitral leaflet restriction.  - Echo (8/23): EF 45% with global hypokinesis, mild LV dilation, mildly decreased RV systolic function, moderate MR.  3. CAD: LHC (6/16) with 50% mLCx stenosis.  4. Mitral valve disorder: TEE in 3/21 with moderate MR and restricted posterior leaflet, no Pamela.  - Moderate MR on 8/23 echo.   SH: Lives with parents in [place].  Works at [employer].  Non smoker, no ETOH.   FH: Father and mother  with HTN.  No CHF or CAD that she knows of.    ROS: All systems reviewed and negative except as per HPI.   Current Outpatient Medications  Medication Sig Dispense Refill   acetaminophen (TYLENOL) 500 MG tablet Take 500 mg by mouth every 6 (six) hours as needed for mild pain.     aspirin EC 81 MG EC tablet Take 1 tablet (81 mg total) by mouth daily. 30 tablet 0   carvedilol (COREG) 25 MG tablet TAKE 1 TABLET BY MOUTH TWICE DAILY WITH A MEAL 180 tablet 0   ENTRESTO 97-103 MG TAKE 1 TABLET BY MOUTH  TWICE DAILY. NEEDS TO KEEP APPOINTMENT FOR REFILLS 60 tablet 0   FARXIGA 10 MG TABS tablet Take 1 tablet (10 mg total) by mouth daily before breakfast. 90 tablet 3   torsemide (DEMADEX) 20 MG tablet Take 2 tablets by mouth twice daily 200 tablet 0   atorvastatin (LIPITOR) 40 MG tablet Take 1 tablet (40 mg total) by mouth daily. 90 tablet 3   isosorbide-hydrALAZINE ([name]) 20-37.5 MG tablet Take 0.5 tablets by mouth 3 (three) times daily. 90 tablet 4   potassium chloride (KLOR-CON) 10 MEQ tablet Take 2 tablets (20 mEq total) by mouth daily. 180 tablet 3   spironolactone (ALDACTONE) 25 MG tablet Take 1 tablet (25 mg total) by mouth daily. 90 tablet 3   No current facility-administered medications for this encounter.   BP (!) 140/90   Pulse 86   Wt 89.2 kg (196 lb 9.6 oz)   LMP 02/23/2015   SpO2 99%   BMI 37.15 kg/m   Wt Readings from Last 3 Encounters:  04/20/23 89.2 kg (196 lb 9.6 oz)  12/16/21 87.2 kg (192 lb 3.2 oz)  10/16/21 82.6 kg (182 lb)   Physical Exam  General: NAD Neck: No JVD, no thyromegaly or thyroid nodule.  Lungs: Clear to auscultation bilaterally with normal respiratory effort. CV: Nondisplaced PMI.  Heart regular S1/S2, no S3/S4, no murmur.  No peripheral edema.  No carotid bruit.  Normal pedal pulses.  Abdomen: Soft, nontender, no hepatosplenomegaly, no distention.  Skin: Intact without lesions or rashes.  Neurologic: Alert and oriented x 3.  Psych: Normal affect. Extremities: No clubbing or cyanosis.  HEENT: Normal.   Assessment/Plan: 1. Chronic systolic CHF: Nonischemic cardiomyopathy.  Cardiac MRI from 1/17 showed improvement in EF up to 55%.  Possible viral myocarditis with recovery. However, TEE in 3/21 showed EF down a bit to 45%.  Echo 8/23 showed EF 45% with global hypokinesis, mild LV dilation, mildly decreased RV systolic function, moderate MR.  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  NYHA class II-III symptoms, worse recently.  She has been off several  medications.  Weight is up, but she does not look volume overloaded on exam.  - Continue torsemide 40 mg bid and restart KCl 20 daily.  I will add back Farxiga 10 mg daily to give some additional diuresis.  BMET/BNP today, BMET in 10 days.  - Restart spironolactone 25 mg daily.  - Continue Coreg 25 mg bid.    - Continue Entresto 97-103 mg bid.  - Restart [name] 0.5 tab tid.  - I will arrange for repeat echo.  2. CAD: Moderate nonobstructive CAD on cath 2016.  No chest pain.  - Continue ASA 81.  - Restart atorvastatin 40 mg daily with lipids/LFTs in 2 months.  3. HTN: BP elevated today.   - Restarting spironolactone and [name].  4. Mitral valve disorders: TEE in 3/21 with restricted posterior  mitral leaflet and moderate MR with no mitral stenosis.  Moderate MR on last echo. - Planning followup echo.  5. SDOH: She is establishing with PCP in [place].  Follow up in 1 month with APP.  I spent 32 minutes reviewing data, interviewing patient, and organizing the orders/followup.    Marca Ancona  04/21/2023

## 2023-04-30 ENCOUNTER — Ambulatory Visit (HOSPITAL_COMMUNITY)
Admission: RE | Admit: 2023-04-30 | Discharge: 2023-04-30 | Disposition: A | Discharge status: Home / Self Care | Payer: Medicaid Other | Source: Ambulatory Visit | Attending: Cardiology | Admitting: Cardiology

## 2023-04-30 DIAGNOSIS — I080 Rheumatic disorders of both mitral and aortic valves: Secondary | ICD-10-CM | POA: Diagnosis not present

## 2023-04-30 DIAGNOSIS — I5022 Chronic systolic (congestive) heart failure: Secondary | ICD-10-CM | POA: Insufficient documentation

## 2023-04-30 LAB — ECHOCARDIOGRAM COMPLETE
Area-P 1/2: 3.5 cm2
Calc EF: 42.8 %
MV M vel: 5.91 m/s
MV Peak grad: 139.9 mmHg
Radius: 0.5 cm
S' Lateral: 4.8 cm
Single Plane A2C EF: 41.1 %
Single Plane A4C EF: 42.8 %

## 2023-05-03 ENCOUNTER — Telehealth (HOSPITAL_COMMUNITY): Payer: Self-pay | Admitting: *Deleted

## 2023-05-03 NOTE — Telephone Encounter (Signed)
 Called patient per Dr. Shirlee Latch with following echo results:  "EF 40-45%, stable."  Pt verbalized understanding of same. No further questions at this time.

## 2023-05-04 ENCOUNTER — Other Ambulatory Visit (HOSPITAL_COMMUNITY): Payer: Medicaid Other

## 2023-05-13 ENCOUNTER — Other Ambulatory Visit (HOSPITAL_COMMUNITY): Payer: Self-pay | Admitting: Cardiology

## 2023-05-18 ENCOUNTER — Telehealth (HOSPITAL_COMMUNITY): Payer: Self-pay | Admitting: *Deleted

## 2023-05-18 NOTE — Telephone Encounter (Signed)
 Called patient and reminded her of appointment tomorrow in Heart Failure APP Clinic at 11:30. Pt plans on coming to appointment.

## 2023-05-18 NOTE — Progress Notes (Signed)
 Patient ID: Pamela Brooks, female   DOB: [birth year], 53 y.o.   MRN: [id number]    Advanced Heart Failure Clinic Note   PCP: Pcp, No HF Cardiology: Dr. Shirlee Latch  Chief complaint: CHF  Pamela Brooks is a 53 y.o. with history of HTN and chronic systolic CHF presents for CHF clinic evaluation.  Pamela Brooks went to the ER in 6/16 with dyspnea and peripheral edema. No chest pain.  No prior viral-type illness.  Pamela Brooks was found to be in CHF, echo with EF 25-30%.  LHC was done, showing nonobstructive stenosis in the circumflex.  Pamela Brooks was diuresed and BP controlled.     Cardiac MRI in 1/17 showed EF improved to 55%. Admitted 1/23 -03/20/15 with hypotension and near syncope. It was discovered pt had continued her losartan on top of her Entresto. Meds helded and then resumed without losartan.  Spironolactone stopped with hypotension and improved EF. Discharge weight 167 lbs. Echo in 2/21 showed EF back down to 35-40% with restrictive posterior mitral leaflet and possible moderate mitral stenosis with moderate MR.   TEE was then done in 3/21, showing EF 45% with mild LV dilation/mild LV hypertrophy, mildly decreased RV systolic function, moderate MR, no mitral stenosis.   Follow up 5/23, volume overloaded, REDs 40%. Lasix and spiro restarted. Advised follow up in 2-3 weeks but Pamela Brooks cancelled her appointment twice.  Seen in ED 08/29/21 with CP and cough. Trop negative and BNP 2400, received IV lasix. Advised doubling lasix dose for a few days thereafter.  Echo 8/23 showed EF 45% with global hypokinesis, mild LV dilation, mildly decreased RV systolic function, moderate MR.   Today Pamela Brooks returns for HF follow up. We have not seen her here for > 1 year.  Pamela Brooks is currently off atorvastatin, Farxiga, [name], KCl, and spironolactone.  Pamela Brooks gets fatigued easily.  Pamela Brooks notes shortness of breath walking up stairs though Pamela Brooks denies dyspnea walking on flat ground.  Pamela Brooks has orthopnea and sleeps on 2 pillows.  No chest pain.  No palpitations or  lightheadedness.  No problems walking to the mailbox. Pamela Brooks is out of work since the [employer] in [place] closed. Weight up 4 lbs.   ECG (personally reviewed): NSR, PVC, QTc 517  Labs (1/21): LDL 86, HDL 40 Labs (2/21): K 3.7, creatinine 1.1 Labs (3/21): K 4, creatinine 0.98 Labs (7/23): K 3.7, creatinine 1.05, BNP 402 Labs (8/23): K 4.1, creatinine 1.12  PMH: 1. HTN 2. Chronic systolic CHF: Nonischemic cardiomyopathy, ?due to myocarditis versus HTN.   - LHC (6/16) with 50% mLCx stenosis.  Echo (6/16) with EF 25-30%, moderately dilated LV, moderate MR.   - Echo (10/16) with EF 30%, moderate LV dilation.  - cMRI  03/19/15 LVEF 55%, normal RV, no LGE, so no definitive evidence for prior MI, myocarditis, or infiltrative disease. - Echo 03/2019 EF 35-40%, mitral valve restricted posterior leaflet, moderate mitral stenosis - TEE (3/21): EF 45%, mild LV dilation with mild LVH, mildly decreased RV systolic function, moderate MR with ERO 0.2 cm^2, no mitral stenosis, posterior mitral leaflet restriction.  - Echo (8/23): EF 45% with global hypokinesis, mild LV dilation, mildly decreased RV systolic function, moderate MR.  3. CAD: LHC (6/16) with 50% mLCx stenosis.  4. Mitral valve disorder: TEE in 3/21 with moderate MR and restricted posterior leaflet, no Pamela.  - Moderate MR on 8/23 echo.   SH: Lives with parents in [place].  Works at [employer].  Non smoker, no ETOH.   FH: Father and mother  with HTN.  No CHF or CAD that Pamela Brooks knows of.    ROS: All systems reviewed and negative except as per HPI.   Current Outpatient Medications  Medication Sig Dispense Refill   acetaminophen (TYLENOL) 500 MG tablet Take 500 mg by mouth every 6 (six) hours as needed for mild pain.     aspirin EC 81 MG EC tablet Take 1 tablet (81 mg total) by mouth daily. 30 tablet 0   atorvastatin (LIPITOR) 40 MG tablet Take 1 tablet (40 mg total) by mouth daily. 90 tablet 3   [name] 20-37.5 MG tablet Take 0.5 tablets by  mouth 3 (three) times daily. 135 tablet 3   carvedilol (COREG) 25 MG tablet TAKE 1 TABLET BY MOUTH TWICE DAILY WITH A MEAL 180 tablet 1   ENTRESTO 97-103 MG TAKE 1 TABLET BY MOUTH TWICE DAILY. NEEDS TO KEEP APPOINTMENT FOR REFILLS 60 tablet 0   FARXIGA 10 MG TABS tablet Take 1 tablet (10 mg total) by mouth daily before breakfast. 90 tablet 3   potassium chloride (KLOR-CON) 10 MEQ tablet Take 2 tablets (20 mEq total) by mouth daily. 180 tablet 3   spironolactone (ALDACTONE) 25 MG tablet Take 1 tablet (25 mg total) by mouth daily. 90 tablet 3   torsemide (DEMADEX) 20 MG tablet Take 2 tablets by mouth twice daily 200 tablet 0   No current facility-administered medications for this visit.   LMP 02/23/2015   Wt Readings from Last 3 Encounters:  04/20/23 89.2 kg (196 lb 9.6 oz)  12/16/21 87.2 kg (192 lb 3.2 oz)  10/16/21 82.6 kg (182 lb)   Physical Exam  General: NAD Neck: No JVD, no thyromegaly or thyroid nodule.  Lungs: Clear to auscultation bilaterally with normal respiratory effort. CV: Nondisplaced PMI.  Heart regular S1/S2, no S3/S4, no murmur.  No peripheral edema.  No carotid bruit.  Normal pedal pulses.  Abdomen: Soft, nontender, no hepatosplenomegaly, no distention.  Skin: Intact without lesions or rashes.  Neurologic: Alert and oriented x 3.  Psych: Normal affect. Extremities: No clubbing or cyanosis.  HEENT: Normal.   Assessment/Plan: 1. Chronic systolic CHF: Nonischemic cardiomyopathy.  Cardiac MRI from 1/17 showed improvement in EF up to 55%.  Possible viral myocarditis with recovery. However, TEE in 3/21 showed EF down a bit to 45%.  Echo 8/23 showed EF 45% with global hypokinesis, mild LV dilation, mildly decreased RV systolic function, moderate MR.  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  NYHA class II-III symptoms, worse recently.  Pamela Brooks has been off several medications.  Weight is up, but Pamela Brooks does not look volume overloaded on exam.  - Continue torsemide 40 mg bid and restart  KCl 20 daily.  I will add back Farxiga 10 mg daily to give some additional diuresis.  BMET/BNP today, BMET in 10 days.  - Restart spironolactone 25 mg daily.  - Continue Coreg 25 mg bid.    - Continue Entresto 97-103 mg bid.  - Restart [name] 0.5 tab tid.  - I will arrange for repeat echo.  2. CAD: Moderate nonobstructive CAD on cath 2016.  No chest pain.  - Continue ASA 81.  - Restart atorvastatin 40 mg daily with lipids/LFTs in 2 months.  3. HTN: BP elevated today.   - Restarting spironolactone and [name].  4. Mitral valve disorders: TEE in 3/21 with restricted posterior mitral leaflet and moderate MR with no mitral stenosis.  Moderate MR on last echo. - Planning followup echo.  5. SDOH: Pamela Brooks is establishing with PCP in  [place].  Follow up in 1 month with APP.  I spent 32 minutes reviewing data, interviewing patient, and organizing the orders/followup.    Anderson [facility]  05/18/2023

## 2023-05-19 ENCOUNTER — Encounter (HOSPITAL_COMMUNITY): Payer: Self-pay

## 2023-05-19 ENCOUNTER — Ambulatory Visit (HOSPITAL_COMMUNITY)
Admission: RE | Admit: 2023-05-19 | Discharge: 2023-05-19 | Disposition: A | Discharge status: Home / Self Care | Payer: Medicaid Other | Source: Ambulatory Visit | Attending: Family Medicine | Admitting: Family Medicine

## 2023-05-19 VITALS — BP 120/90 | HR 86 | Wt 198.8 lb

## 2023-05-19 DIAGNOSIS — I10 Essential (primary) hypertension: Secondary | ICD-10-CM | POA: Diagnosis not present

## 2023-05-19 DIAGNOSIS — Z79899 Other long term (current) drug therapy: Secondary | ICD-10-CM | POA: Insufficient documentation

## 2023-05-19 DIAGNOSIS — Z7982 Long term (current) use of aspirin: Secondary | ICD-10-CM | POA: Insufficient documentation

## 2023-05-19 DIAGNOSIS — I059 Rheumatic mitral valve disease, unspecified: Secondary | ICD-10-CM | POA: Insufficient documentation

## 2023-05-19 DIAGNOSIS — I110 Hypertensive heart disease with heart failure: Secondary | ICD-10-CM | POA: Diagnosis not present

## 2023-05-19 DIAGNOSIS — R0683 Snoring: Secondary | ICD-10-CM | POA: Diagnosis not present

## 2023-05-19 DIAGNOSIS — I5022 Chronic systolic (congestive) heart failure: Secondary | ICD-10-CM | POA: Insufficient documentation

## 2023-05-19 DIAGNOSIS — I428 Other cardiomyopathies: Secondary | ICD-10-CM | POA: Insufficient documentation

## 2023-05-19 DIAGNOSIS — I2510 Atherosclerotic heart disease of native coronary artery without angina pectoris: Secondary | ICD-10-CM | POA: Insufficient documentation

## 2023-05-19 LAB — BASIC METABOLIC PANEL
Anion gap: 8 (ref 5–15)
BUN: 11 mg/dL (ref 6–20)
CO2: 21 mmol/L — ABNORMAL LOW (ref 22–32)
Calcium: 9.1 mg/dL (ref 8.9–10.3)
Chloride: 110 mmol/L (ref 98–111)
Creatinine, Ser: 1.14 mg/dL — ABNORMAL HIGH (ref 0.44–1.00)
GFR, Estimated: 57 mL/min — ABNORMAL LOW (ref >60)
Glucose, Bld: 112 mg/dL — ABNORMAL HIGH (ref 70–99)
Potassium: 4.3 mmol/L (ref 3.5–5.1)
Sodium: 139 mmol/L (ref 135–145)

## 2023-05-19 NOTE — Patient Instructions (Addendum)
 Thank you for coming in today  If you had labs drawn today, any labs that are abnormal the clinic will call you No news is good news  Medications: No changes  Follow up appointments:  Your physician recommends that you schedule a follow-up appointment in:  6 months With Dr. Shirlee Latch  Please call our office to schedule the follow-up appointment in July 2025 .    Do the following things EVERYDAY: Weigh yourself in the morning before breakfast. Write it down and keep it in a log. Take your medicines as prescribed Eat low salt foods--Limit salt (sodium) to 2000 mg per day.  Stay as active as you can everyday Limit all fluids for the day to less than 2 liters   At the [facility], you and your health needs are our priority. As part of our continuing mission to provide you with exceptional heart care, we have created designated Provider Care Teams. These Care Teams include your primary Cardiologist (physician) and Advanced Practice Providers (APPs- Physician Assistants and Nurse Practitioners) who all work together to provide you with the care you need, when you need it.   You may see any of the following providers on your designated Care Team at your next follow up: Dr Arvilla Meres Dr Marca Ancona Dr. Marcos Eke, NP Robbie Lis, [place] [facility] [place], [place] Brynda Peon, NP Karle Plumber, PharmD   Please be sure to bring in all your medications bottles to every appointment.    Thank you for choosing [facility]  If you have any questions or concerns before your next appointment please send [place] a message through [place] or call our office at [phone].    TO LEAVE A MESSAGE FOR THE NURSE SELECT OPTION 2, PLEASE LEAVE A MESSAGE INCLUDING: YOUR NAME DATE OF BIRTH CALL BACK NUMBER REASON FOR CALL**this is important as we prioritize the call backs  YOU WILL RECEIVE A CALL BACK THE SAME  DAY AS LONG AS YOU CALL BEFORE 4:00 PM

## 2023-06-14 ENCOUNTER — Telehealth (HOSPITAL_COMMUNITY): Payer: Self-pay

## 2023-06-14 ENCOUNTER — Ambulatory Visit (HOSPITAL_COMMUNITY)
Admission: RE | Admit: 2023-06-14 | Discharge: 2023-06-14 | Disposition: A | Discharge status: Home / Self Care | Payer: Medicaid Other | Source: Ambulatory Visit | Attending: Cardiology

## 2023-06-14 DIAGNOSIS — I5022 Chronic systolic (congestive) heart failure: Secondary | ICD-10-CM | POA: Diagnosis not present

## 2023-06-14 LAB — HEPATIC FUNCTION PANEL
ALT: 11 U/L (ref 0–44)
AST: 15 U/L (ref 15–41)
Albumin: 3.6 g/dL (ref 3.5–5.0)
Alkaline Phosphatase: 101 U/L (ref 38–126)
Bilirubin, Direct: 0.2 mg/dL (ref 0.0–0.2)
Indirect Bilirubin: 0.3 mg/dL (ref 0.3–0.9)
Total Bilirubin: 0.5 mg/dL (ref 0.0–1.2)
Total Protein: 7.8 g/dL (ref 6.5–8.1)

## 2023-06-14 LAB — LIPID PANEL
Cholesterol: 134 mg/dL (ref 0–200)
HDL: 50 mg/dL (ref >40)
LDL Cholesterol: 70 mg/dL (ref 0–99)
Total CHOL/HDL Ratio: 2.7 ratio
Triglycerides: 69 mg/dL (ref <150)
VLDL: 14 mg/dL (ref 0–40)

## 2023-06-14 NOTE — Telephone Encounter (Signed)
  [facility]   Pre-operative Risk Assessment     Request for Surgical Clearance{  Procedure:  Dental Extraction - Amount of Teeth to be Pulled:  25 { Date of Surgery:  Clearance TBD                               { Surgeon:  Stewart Elk Surgeon's Group or Practice Name:  [facility]  Phone number:  [phone] Fax number:  [phone] { Type of Clearance Requested:   - Medical  - Pharmacy:  Hold Aspirin     Type of Anesthesia:   not listed   Signed, Sircharles Holzheimer B Takai Chiaramonte   06/14/2023, 2:58 PM   [facility]

## 2023-06-15 NOTE — Telephone Encounter (Signed)
 Ramos, Arlice Bene, FNP  You1 hour ago (8:38 AM)    Ok from CV standpoint for dental procedure. OK to hold ASA 5-7 days before extractions, would resume as soon as safe to do so post-extraction    Copy of clearance routed/faxed via Epic fax function to requesting office.

## 2023-09-21 ENCOUNTER — Other Ambulatory Visit (HOSPITAL_COMMUNITY): Payer: Self-pay | Admitting: Family Medicine

## 2023-09-27 ENCOUNTER — Other Ambulatory Visit (HOSPITAL_COMMUNITY): Payer: Self-pay | Admitting: Family Medicine

## 2023-09-27 ENCOUNTER — Other Ambulatory Visit (HOSPITAL_COMMUNITY): Payer: Self-pay

## 2024-01-13 ENCOUNTER — Other Ambulatory Visit (HOSPITAL_COMMUNITY): Payer: Self-pay | Admitting: Cardiology

## 2024-03-25 ENCOUNTER — Other Ambulatory Visit (HOSPITAL_COMMUNITY): Payer: Self-pay | Admitting: Cardiology
